# Patient Record
Sex: Male | Born: 1955 | Race: White | Hispanic: No | Smoking: Never smoker
Health system: Southern US, Community
[De-identification: ages and names within clinical notes are randomized; demographics above are authoritative.]

## PROBLEM LIST (undated history)

## (undated) DIAGNOSIS — L02219 Cutaneous abscess of trunk, unspecified: Secondary | ICD-10-CM

## (undated) DIAGNOSIS — I4891 Unspecified atrial fibrillation: Secondary | ICD-10-CM

## (undated) DIAGNOSIS — M545 Low back pain, unspecified: Secondary | ICD-10-CM

## (undated) DIAGNOSIS — L03119 Cellulitis of unspecified part of limb: Secondary | ICD-10-CM

## (undated) DIAGNOSIS — J45909 Unspecified asthma, uncomplicated: Secondary | ICD-10-CM

## (undated) DIAGNOSIS — C6290 Malignant neoplasm of unspecified testis, unspecified whether descended or undescended: Secondary | ICD-10-CM

## (undated) DIAGNOSIS — N529 Male erectile dysfunction, unspecified: Secondary | ICD-10-CM

## (undated) DIAGNOSIS — K5790 Diverticulosis of intestine, part unspecified, without perforation or abscess without bleeding: Secondary | ICD-10-CM

## (undated) DIAGNOSIS — I10 Essential (primary) hypertension: Secondary | ICD-10-CM

## (undated) DIAGNOSIS — L03319 Cellulitis of trunk, unspecified: Secondary | ICD-10-CM

## (undated) DIAGNOSIS — Z86010 Personal history of colonic polyps: Secondary | ICD-10-CM

## (undated) DIAGNOSIS — C259 Malignant neoplasm of pancreas, unspecified: Secondary | ICD-10-CM

## (undated) DIAGNOSIS — K8689 Other specified diseases of pancreas: Secondary | ICD-10-CM

## (undated) DIAGNOSIS — L02419 Cutaneous abscess of limb, unspecified: Secondary | ICD-10-CM

## (undated) HISTORY — DX: Unspecified asthma, uncomplicated: J45.909

## (undated) HISTORY — DX: Cellulitis of trunk, unspecified: L03.319

## (undated) HISTORY — DX: Essential (primary) hypertension: I10

## (undated) HISTORY — DX: Malignant neoplasm of unspecified testis, unspecified whether descended or undescended: C62.90

## (undated) HISTORY — DX: Cutaneous abscess of trunk, unspecified: L02.219

## (undated) HISTORY — DX: Male erectile dysfunction, unspecified: N52.9

## (undated) HISTORY — DX: Low back pain: M54.5

## (undated) HISTORY — DX: Cutaneous abscess of limb, unspecified: L02.419

## (undated) HISTORY — PX: CLAVICLE SURGERY: SHX598

## (undated) HISTORY — PX: KNEE ARTHROPLASTY: SHX992

## (undated) HISTORY — DX: Diverticulosis of intestine, part unspecified, without perforation or abscess without bleeding: K57.90

## (undated) HISTORY — DX: Low back pain, unspecified: M54.50

## (undated) HISTORY — DX: Malignant neoplasm of pancreas, unspecified: C25.9

## (undated) HISTORY — DX: Other specified diseases of pancreas: K86.89

## (undated) HISTORY — PX: TONSILLECTOMY: SUR1361

## (undated) HISTORY — PX: OTHER SURGICAL HISTORY: SHX169

## (undated) HISTORY — DX: Unspecified atrial fibrillation: I48.91

## (undated) HISTORY — DX: Cellulitis of unspecified part of limb: L03.119

## (undated) HISTORY — DX: Personal history of colonic polyps: Z86.010

---

## 2001-03-04 DIAGNOSIS — C6290 Malignant neoplasm of unspecified testis, unspecified whether descended or undescended: Secondary | ICD-10-CM

## 2001-03-04 HISTORY — PX: ORCHIECTOMY: SHX2116

## 2001-03-04 HISTORY — DX: Malignant neoplasm of unspecified testis, unspecified whether descended or undescended: C62.90

## 2001-07-29 ENCOUNTER — Ambulatory Visit (HOSPITAL_COMMUNITY): Admission: RE | Admit: 2001-07-29 | Discharge: 2001-07-29 | Payer: Self-pay | Admitting: Urology

## 2001-07-29 ENCOUNTER — Encounter: Payer: Self-pay | Admitting: Urology

## 2001-08-05 ENCOUNTER — Encounter (INDEPENDENT_AMBULATORY_CARE_PROVIDER_SITE_OTHER): Payer: Self-pay | Admitting: Specialist

## 2001-08-05 ENCOUNTER — Ambulatory Visit (HOSPITAL_BASED_OUTPATIENT_CLINIC_OR_DEPARTMENT_OTHER): Admission: RE | Admit: 2001-08-05 | Discharge: 2001-08-05 | Payer: Self-pay | Admitting: Urology

## 2001-08-11 ENCOUNTER — Ambulatory Visit: Admission: RE | Admit: 2001-08-11 | Discharge: 2001-11-09 | Payer: Self-pay | Admitting: *Deleted

## 2001-08-25 ENCOUNTER — Ambulatory Visit (HOSPITAL_COMMUNITY): Admission: AD | Admit: 2001-08-25 | Discharge: 2001-08-25 | Payer: Self-pay | Admitting: Urology

## 2001-08-26 ENCOUNTER — Encounter (HOSPITAL_BASED_OUTPATIENT_CLINIC_OR_DEPARTMENT_OTHER): Admission: RE | Admit: 2001-08-26 | Discharge: 2001-10-27 | Payer: Self-pay | Admitting: Urology

## 2005-10-26 ENCOUNTER — Encounter (INDEPENDENT_AMBULATORY_CARE_PROVIDER_SITE_OTHER): Payer: Self-pay | Admitting: Cardiology

## 2005-10-26 ENCOUNTER — Inpatient Hospital Stay (HOSPITAL_COMMUNITY): Admission: EM | Admit: 2005-10-26 | Discharge: 2005-11-05 | Payer: Self-pay | Admitting: *Deleted

## 2005-10-28 ENCOUNTER — Ambulatory Visit: Payer: Self-pay | Admitting: Physical Medicine & Rehabilitation

## 2005-11-05 ENCOUNTER — Inpatient Hospital Stay (HOSPITAL_COMMUNITY)
Admission: RE | Admit: 2005-11-05 | Discharge: 2005-11-08 | Payer: Self-pay | Admitting: Physical Medicine & Rehabilitation

## 2005-12-05 ENCOUNTER — Ambulatory Visit: Payer: Self-pay | Admitting: Internal Medicine

## 2006-01-15 ENCOUNTER — Ambulatory Visit: Payer: Self-pay | Admitting: Internal Medicine

## 2006-03-21 ENCOUNTER — Ambulatory Visit: Payer: Self-pay | Admitting: Internal Medicine

## 2006-04-11 ENCOUNTER — Ambulatory Visit: Payer: Self-pay | Admitting: Internal Medicine

## 2006-10-22 DIAGNOSIS — M545 Low back pain: Secondary | ICD-10-CM

## 2007-07-10 ENCOUNTER — Ambulatory Visit: Payer: Self-pay | Admitting: Internal Medicine

## 2007-07-10 DIAGNOSIS — L02419 Cutaneous abscess of limb, unspecified: Secondary | ICD-10-CM

## 2007-07-10 DIAGNOSIS — L03119 Cellulitis of unspecified part of limb: Secondary | ICD-10-CM

## 2009-02-06 ENCOUNTER — Telehealth: Payer: Self-pay | Admitting: Internal Medicine

## 2009-02-07 ENCOUNTER — Ambulatory Visit: Payer: Self-pay | Admitting: Internal Medicine

## 2009-02-07 DIAGNOSIS — L03319 Cellulitis of trunk, unspecified: Secondary | ICD-10-CM

## 2009-02-07 DIAGNOSIS — L02219 Cutaneous abscess of trunk, unspecified: Secondary | ICD-10-CM

## 2009-02-15 ENCOUNTER — Ambulatory Visit: Payer: Self-pay | Admitting: Internal Medicine

## 2009-11-15 ENCOUNTER — Encounter: Payer: Self-pay | Admitting: Internal Medicine

## 2010-04-03 NOTE — Letter (Signed)
Summary: [facility]  [facility]   Imported By: Maryln Gottron 11/21/2009 09:48:49  _____________________________________________________________________  External Attachment:    Type:   Image     Comment:   External Document

## 2010-05-09 ENCOUNTER — Encounter: Payer: Self-pay | Admitting: Family Medicine

## 2010-05-09 ENCOUNTER — Ambulatory Visit (INDEPENDENT_AMBULATORY_CARE_PROVIDER_SITE_OTHER): Payer: BC Managed Care – PPO | Admitting: Family Medicine

## 2010-05-09 VITALS — BP 140/90 | Temp 99.4°F | Ht 73.0 in | Wt 261.0 lb

## 2010-05-09 DIAGNOSIS — L02419 Cutaneous abscess of limb, unspecified: Secondary | ICD-10-CM

## 2010-05-09 MED ORDER — CEPHALEXIN 500 MG PO CAPS: 500.0000 mg | ORAL_CAPSULE | Freq: Four times a day (QID) | ORAL | Status: AC

## 2010-05-09 NOTE — Patient Instructions (Signed)
Elevate leg frequently Heating pad to leg several times daily. Follow up with Dr Lovell Sheehan if redness and swelling not promptly improving over next few days

## 2010-05-09 NOTE — Progress Notes (Signed)
  Subjective:    Patient ID: Austin Escobar, male    DOB: Aug 19, 1955, 55 y.o.   MRN: [id number]  HPI  Patient seen with swelling and tenderness work left leg mostly posterior calf region. First noticed some soreness this past Sunday which was 4 days ago. By yesterday noticed some redness and swelling. No injury. Denies any fever or chills. 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 Prior history of phlebitis.   Review of Systems  denies fever or chills. No dyspnea or Chest pain.    Objective:   Physical Exam  patient is alert and in no distress Chest clear to auscultation Heart regular rhythm and rate Extremities left leg reveals area of redness warmth tenderness and mild swelling about 10 x 16 cm. He has an erythematous streak which runs the distal medial aspect of calf. No fluctuance. No obvious breaks in scan. Dorsalis pedis pulses are intact       Assessment & Plan:   cellulitis left leg. Frequent elevation , heating pad several times daily and start Keflex 500 mg 4 times a day for 10 days. Followup with primary in the next few days if not improving
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
## 2010-07-20 NOTE — Op Note (Signed)
[facility]  Patient:    RITCHIE, KLEE Visit Number: [id number] MRN: [id number]          Service Type: NES Location: [facility] Attending Physician:  Laqueta Jean Dictated by:   Vonzell Schlatter Patsi Sears, M.D. Proc. Date: 08/05/01 Admit Date:  08/05/2001 Discharge Date: 08/05/2001                             Operative Report  PREOPERATIVE DIAGNOSIS:  Right testicular mass.  POSTOPERATIVE DIAGNOSIS:  Right testicular seminoma.  OPERATION:  Right radical orchiectomy.  SURGEON:  Sigmund I. Patsi Sears, M.D.  Threasa HeadsAllyne Gee, P.A.-S.  PREPARATION:  After appropriate preanesthesia, the patient is brought to the operating room and placed upon the operating table in the dorsal supine position where general endotracheal anesthesia was introduced.  DESCRIPTION OF PROCEDURE:  A 10 cm right inguinal incision was made; subcutaneous tissue was dissected with the electrosurgical unit.  A very large 10 cm right hemiscrotal mass was identified, and it was very hard to palpation.  Subcutaneous tissue was dissected, and the external oblique fascia was opened.  The testicle was delivered into the wound, and a drain was wrapped around the cord and clamped.  Biopsy of the testicle revealed a malignancy, probable seminoma.  The testicle was then removed by amputating the spermatic cord by doubly clamping it, emptying it, ligating with #1 chromic suture, and placing a 0 Prolene suture around the end of the spermatic cord, in case of future removal of the spermatic cord.  The wound was irrigated and closed in the following fashion.  Closure:  The wound was closed with running and interrupted 3-0 Vicryl suture. The skin was closed with interrupted 4-0 Vicryl suture.  Sterile dressings and compression dressing was applied, and the patient was awakened and taken to the recovery room in good condition.  He was given Marcaine in the cord and in the wound edges  and given IV Toradol prior to awakening. Dictated by:   Vonzell Schlatter Patsi Sears, M.D. Attending Physician:  Laqueta Jean DD:  08/05/01 TD:  08/07/01 Job: [id number]
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
## 2010-07-20 NOTE — Op Note (Signed)
[facility]  Patient:    Austin Escobar, Austin Escobar Visit Number: [id number] MRN: [id number]          Service Type: DSU Location: DAY Attending Physician:  Laqueta Jean Dictated by:   Vonzell Schlatter Patsi Sears, M.D. Proc. Date: 08/25/01 Admit Date:  08/25/2001 Discharge Date: 08/25/2001                             Operative Report  PREOPERATIVE DIAGNOSIS:  Right scrotal hematoma.  POSTOPERATIVE DIAGNOSIS:  Possibly infected right scrotal hematoma.  PROCEDURE:  SURGEON:  Sigmund I. Patsi Sears, M.D.  ANESTHESIA:  General endotracheal.  PREPARATION:  After appropriate preanesthesia, the patient was brought to the operating room and placed on the operating table in the dorsal supine position where general endotracheal anesthesia was introduced.  The scrotum and inguinal canal were prepped with Betadine solution and draped in the usual fashion.  REVIEW OF HISTORY:  The patient is a 55 year old male, status post a right inguinal orchiectomy approximately two weeks ago, on August 05, 2001, who has had pain and swelling in his right hemiscrotum since his surgery.  The patient did not go to see the radiation therapist because of pain in his hemiscrotum.  He was seen in the office this morning with diagnosis of postoperative wound hematoma, and for incision and drainage tonight.  DESCRIPTION OF PROCEDURE:  A 4 cm incision was made in the subcutaneous tissue and was dissected through the old incision.  Blunt dissection and sharp dissection was accomplished until the scrotum is entered, and creamy necrotic tissue was identified and cultured.  The wound was then opened more generously distalward into the scrotum, and the scrotum is drained of necrotic hematoma. The Pulsavac was then used to completely debride the scrotal and inguinal tissue, and it was elected to leave this wound open, and place a 4 x 18 dressing soaked in Betadine in the wound.  The patient  was covered with IV Ancef, and then awakened and taken to the recovery room after a dressing was applied.  He will be sent for outpatient wound care. Dictated by:   Vonzell Schlatter Patsi Sears, M.D. Attending Physician:  Laqueta Jean DD:  08/25/01 TD:  08/27/01 Job: [id number]
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
## 2010-10-11 ENCOUNTER — Telehealth: Payer: Self-pay | Admitting: *Deleted

## 2010-10-11 NOTE — Telephone Encounter (Signed)
Opened in error

## 2012-03-12 ENCOUNTER — Encounter: Payer: Self-pay | Admitting: Internal Medicine

## 2012-03-12 ENCOUNTER — Ambulatory Visit (INDEPENDENT_AMBULATORY_CARE_PROVIDER_SITE_OTHER): Payer: BC Managed Care – PPO | Admitting: Internal Medicine

## 2012-03-12 VITALS — BP 160/100 | HR 92 | Temp 99.0°F | Resp 18 | Wt 264.0 lb

## 2012-03-12 DIAGNOSIS — Z0000 Encounter for general adult medical examination without abnormal findings: Secondary | ICD-10-CM

## 2012-03-12 DIAGNOSIS — I499 Cardiac arrhythmia, unspecified: Secondary | ICD-10-CM

## 2012-03-12 DIAGNOSIS — I4891 Unspecified atrial fibrillation: Secondary | ICD-10-CM

## 2012-03-12 DIAGNOSIS — R109 Unspecified abdominal pain: Secondary | ICD-10-CM

## 2012-03-12 MED ORDER — DILTIAZEM HCL ER 180 MG PO CP24: 180.0000 mg | ORAL_CAPSULE | Freq: Every day | ORAL | Status: DC

## 2012-03-12 MED ORDER — ASPIRIN 81 MG PO TBEC: 81.0000 mg | DELAYED_RELEASE_TABLET | Freq: Every day | ORAL | Status: DC

## 2012-03-12 NOTE — Patient Instructions (Addendum)
  Colonoscopy as discussed  Annual exam as scheduled and call if there is any clinical worsening  Low-salt diet  Cardiology followup as discussed

## 2012-03-12 NOTE — Progress Notes (Signed)
Subjective:    Patient ID: Austin Escobar, male    DOB: 1955-05-13, 57 y.o.   MRN: [id number]  HPI  57 year old patient who presents with a chief complaint of left upper quadrant pain. This has been present for the last 5 weeks. He describes it as a sensation of the gaseousness and fullness that occurs intermittently. For the past 2 weeks symptoms have intensified. His bowel habits have become a bit more sluggish but not severe constipation issues and is alleviated by passage of flatus or having a bowel movement. No weight loss nausea back pain. No prior colonoscopy but he is agreeable. He is followed biannually by urology due to to a history of testicular cancer. He takes no chronic medications  No past medical history on file.  History   Social History  . Marital Status: Married    Spouse Name: N/A    Number of Children: N/A  . Years of Education: N/A   Occupational History  . Not on file.   Social History Main Topics  . Smoking status: Never Smoker   . Smokeless tobacco: Not on file  . Alcohol Use: Not on file  . Drug Use: Not on file  . Sexually Active: Not on file   Other Topics Concern  . Not on file   Social History Narrative  . No narrative on file    No past surgical history on file.  No family history on file.  No Known Allergies  Current Outpatient Prescriptions on File Prior to Visit  Medication Sig Dispense Refill  . tadalafil (CIALIS) 20 MG tablet Take 20 mg by mouth daily as needed.      . Cream Base (PCCA LIPODERM BASE) CREA Apply topically. 10 mg daily rubbed into hairless area on body, hips         BP 160/100  Pulse 92  Temp 99 F (37.2 C) (Oral)  Resp 18  Wt 264 lb (119.75 kg)  SpO2 95%       Review of Systems  Constitutional: Negative for fever, chills, appetite change and fatigue.  HENT: Negative for hearing loss, ear pain, congestion, sore throat, trouble swallowing, neck stiffness, dental problem, voice change and tinnitus.   Eyes:  Negative for pain, discharge and visual disturbance.  Respiratory: Negative for cough, chest tightness, wheezing and stridor.   Cardiovascular: Negative for chest pain, palpitations and leg swelling.  Gastrointestinal: Positive for abdominal pain. Negative for nausea, vomiting, diarrhea, constipation, blood in stool and abdominal distention.  Genitourinary: Negative for urgency, hematuria, flank pain, discharge, difficulty urinating and genital sores.  Musculoskeletal: Negative for myalgias, back pain, joint swelling, arthralgias and gait problem.  Skin: Negative for rash.  Neurological: Negative for dizziness, syncope, speech difficulty, weakness, numbness and headaches.  Hematological: Negative for adenopathy. Does not bruise/bleed easily.  Psychiatric/Behavioral: Negative for behavioral problems and dysphoric mood. The patient is not nervous/anxious.        Objective:   Physical Exam  Constitutional: He is oriented to person, place, and time. He appears well-developed.  HENT:  Head: Normocephalic.  Right Ear: External ear normal.  Left Ear: External ear normal.  Eyes: Conjunctivae normal and EOM are normal.  Neck: Normal range of motion.  Cardiovascular: Normal rate and normal heart sounds.        Pulse rate is irregular and 90-100  Pulmonary/Chest: Breath sounds normal.  Abdominal: Soft. Bowel sounds are normal. He exhibits no distension and no mass. There is no tenderness. There is no rebound and  no guarding.  Musculoskeletal: Normal range of motion. He exhibits no edema and no tenderness.  Neurological: He is alert and oriented to person, place, and time.  Psychiatric: He has a normal mood and affect. His behavior is normal.          Assessment & Plan:   Nonspecific abdominal pain with mild change in bowel habits. We'll set up for a colonoscopy Atrial fibrillation. Will place on diltiazem for rate control and place on daily aspirin ([place] 0).  We'll check 2-D echocardiogram  and set up for cardiology evaluation. Will consider anticoagulation at that time Exogenous obesity. Weight loss encouraged  Schedule CPX with PCP

## 2012-03-13 ENCOUNTER — Encounter: Payer: Self-pay | Admitting: Internal Medicine

## 2012-03-18 ENCOUNTER — Ambulatory Visit (HOSPITAL_COMMUNITY): Payer: BC Managed Care – PPO | Attending: Cardiovascular Disease | Admitting: Radiology

## 2012-03-18 DIAGNOSIS — I10 Essential (primary) hypertension: Secondary | ICD-10-CM | POA: Insufficient documentation

## 2012-03-18 DIAGNOSIS — I369 Nonrheumatic tricuspid valve disorder, unspecified: Secondary | ICD-10-CM | POA: Insufficient documentation

## 2012-03-18 DIAGNOSIS — I4891 Unspecified atrial fibrillation: Secondary | ICD-10-CM

## 2012-03-18 DIAGNOSIS — I379 Nonrheumatic pulmonary valve disorder, unspecified: Secondary | ICD-10-CM | POA: Insufficient documentation

## 2012-03-18 NOTE — Progress Notes (Signed)
Echocardiogram performed.  

## 2012-03-24 ENCOUNTER — Encounter: Payer: Self-pay | Admitting: *Deleted

## 2012-03-24 ENCOUNTER — Ambulatory Visit (INDEPENDENT_AMBULATORY_CARE_PROVIDER_SITE_OTHER): Payer: BC Managed Care – PPO | Admitting: Cardiovascular Disease

## 2012-03-24 VITALS — BP 126/80 | HR 90 | Wt 258.0 lb

## 2012-03-24 DIAGNOSIS — I10 Essential (primary) hypertension: Secondary | ICD-10-CM | POA: Insufficient documentation

## 2012-03-24 DIAGNOSIS — I4891 Unspecified atrial fibrillation: Secondary | ICD-10-CM | POA: Insufficient documentation

## 2012-03-24 DIAGNOSIS — D649 Anemia, unspecified: Secondary | ICD-10-CM

## 2012-03-24 NOTE — Assessment & Plan Note (Signed)
Continue ASA and cardizem.  Rate control fine. CHADVASC2 score only one so no anticoagulation indicated unless he wants attempt at [facility].   F/U in February post colonoscopy.  Etiology related to ETOH and HTN.  Need to abstain discussed

## 2012-03-24 NOTE — Assessment & Plan Note (Signed)
Well controlled.  Continue current medications and low sodium Dash type diet.    

## 2012-03-24 NOTE — Assessment & Plan Note (Signed)
To see GI and arrange colonoscopy No anticoagulation until this is done

## 2012-03-24 NOTE — Progress Notes (Signed)
Patient ID: Austin Escobar, male   DOB: 21-Jan-1956, 57 y.o.   MRN: [id number]  57 yo referred by Dr Kirtland Bouchard for afib.  Patient is asymptomatic.  Likely has HTN not Rx until a week ago.  CHADVASC2 score only 1.  Admits to daily excessive beer consumption.  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  Echo with no valve disease normal EF and moderate LAE.  He needs a colonoscopy in the near future.  Discussed options of ASA and rate control since he is asymptomatic.  No reason to [facility] if he cannot stop ETOH as he will revert.  Also no reason to start anticoagulant if it needs to be stopped for colonoscopy.  Will see again end of February and he will let me know how ETOH is going and decide if he wants and attempt at [facility].  No chest pain, dyspnea or palpitations. Abdominal pain is improved since seeing Dr Kirtland Bouchard  Study Conclusions  03/1512    - Left ventricle: The cavity size was normal. Wall thickness was increased in a pattern of moderate LVH. Systolic function was vigorous. The estimated ejection fraction was in the range of 65% to 70%. - Left atrium: The atrium was moderately dilated.   ROS: Denies fever, malais, weight loss, blurry vision, decreased visual acuity, cough, sputum, SOB, hemoptysis, pleuritic pain, palpitaitons, heartburn, abdominal pain, melena, lower extremity edema, claudication, or rash.  All other systems reviewed and negative   General: Affect appropriate Healthy:  appears stated age HEENT: normal Neck supple with no adenopathy JVP normal no bruits no thyromegaly Lungs clear with no wheezing and good diaphragmatic motion Heart:  S1/S2 no murmur,rub, gallop or click PMI normal Abdomen: benighn, BS positve, no tenderness, no AAA no bruit.  No HSM or HJR Distal pulses intact with no bruits No edema Neuro non-focal Skin warm and dry No muscular weakness  Medications Current Outpatient Prescriptions  Medication Sig Dispense Refill  . aspirin 81 MG EC tablet  Take 1 tablet (81 mg total) by mouth daily. Swallow whole.  30 tablet  12  . Cream Base (PCCA LIPODERM BASE) CREA Apply topically. 10 mg daily rubbed into hairless area on body, hips       . diltiazem (DILACOR XR) 180 MG 24 hr capsule Take 1 capsule (180 mg total) by mouth daily.  90 capsule  4  . MULTIPLE VITAMIN PO Take 1 tablet by mouth daily.      . tadalafil (CIALIS) 20 MG tablet Take 20 mg by mouth daily as needed.        Allergies Review of patient's allergies indicates no known allergies.  Family History: No family history on file.  Social History: History   Social History  . Marital Status: Married    Spouse Name: N/A    Number of Children: N/A  . Years of Education: N/A   Occupational History  . Not on file.   Social History Main Topics  . Smoking status: Never Smoker   . Smokeless tobacco: Not on file  . Alcohol Use: Not on file  . Drug Use: Not on file  . Sexually Active: Not on file   Other Topics Concern  . Not on file   Social History Narrative  . No narrative on file    Electrocardiogram:  03/12/12  AFib rate 88 LVH LAD   Assessment and Plan

## 2012-03-24 NOTE — Patient Instructions (Signed)
Your physician recommends that you schedule a follow-up appointment in: END OF FEB WITH DR [facility]  Your physician recommends that you continue on your current medications as directed. Please refer to the Current Medication list given to you today.

## 2012-03-25 ENCOUNTER — Ambulatory Visit: Payer: BC Managed Care – PPO | Admitting: Internal Medicine

## 2012-03-26 ENCOUNTER — Encounter: Payer: Self-pay | Admitting: Internal Medicine

## 2012-03-26 ENCOUNTER — Ambulatory Visit (INDEPENDENT_AMBULATORY_CARE_PROVIDER_SITE_OTHER): Payer: BC Managed Care – PPO | Admitting: Internal Medicine

## 2012-03-26 VITALS — BP 120/74 | HR 92 | Temp 98.7°F | Ht 74.5 in | Wt 256.0 lb

## 2012-03-26 DIAGNOSIS — I4891 Unspecified atrial fibrillation: Secondary | ICD-10-CM

## 2012-03-26 DIAGNOSIS — I10 Essential (primary) hypertension: Secondary | ICD-10-CM

## 2012-03-26 DIAGNOSIS — R109 Unspecified abdominal pain: Secondary | ICD-10-CM | POA: Insufficient documentation

## 2012-03-26 LAB — TSH: TSH: 2.43 u[IU]/mL (ref 0.35–5.50)

## 2012-03-26 LAB — CBC WITH DIFFERENTIAL/PLATELET
Basophils Absolute: 0 10*3/uL (ref 0.0–0.1)
Eosinophils Relative: 0.8 % (ref 0.0–5.0)
HCT: 57 % — ABNORMAL HIGH (ref 39.0–52.0)
Lymphs Abs: 1.4 10*3/uL (ref 0.7–4.0)
Monocytes Absolute: 0.8 10*3/uL (ref 0.1–1.0)
Monocytes Relative: 9.7 % (ref 3.0–12.0)
Neutrophils Relative %: 73.2 % (ref 43.0–77.0)
Platelets: 222 10*3/uL (ref 150.0–400.0)
RDW: 13.2 % (ref 11.5–14.6)
WBC: 8.5 10*3/uL (ref 4.5–10.5)

## 2012-03-26 LAB — BASIC METABOLIC PANEL
BUN: 16 mg/dL (ref 6–23)
Creatinine, Ser: 1 mg/dL (ref 0.4–1.5)
GFR: 80.36 mL/min (ref >60.00)
Glucose, Bld: 99 mg/dL (ref 70–99)
Potassium: 4.2 mEq/L (ref 3.5–5.1)

## 2012-03-26 LAB — T4, FREE: Free T4: 0.95 ng/dL (ref 0.60–1.60)

## 2012-03-26 LAB — HEPATIC FUNCTION PANEL
AST: 26 U/L (ref 0–37)
Bilirubin, Direct: 0.1 mg/dL (ref 0.0–0.3)
Total Bilirubin: 0.9 mg/dL (ref 0.3–1.2)

## 2012-03-26 MED ORDER — DILTIAZEM HCL ER 240 MG PO CP24: 240.0000 mg | ORAL_CAPSULE | Freq: Every day | ORAL | Status: DC

## 2012-03-26 MED ORDER — HYOSCYAMINE SULFATE 0.125 MG PO TABS
0.1250 mg | ORAL_TABLET | Freq: Four times a day (QID) | ORAL | Status: DC | PRN
Start: 2012-03-26 — End: 2012-05-19

## 2012-03-26 NOTE — Progress Notes (Signed)
Subjective:    Patient ID: Austin Escobar, male    DOB: Aug 13, 1955, 57 y.o.   MRN: [id number]  HPI  57 year old white male with history of a-fib and alcohol use for followup regarding abdominal pain. He was seen 2 weeks ago by Dr. Amador Cunas. He complains of intermittent left upper quadrant pain. He describes sensation as "gas type pain". It is usually relieved by bowel movement and passing gas. However patient concerned that his symptoms getting worse. He notes symptoms initially started around Thanksgiving.  Patient rates severity as 3/10. He denies fever or chills but has noticed change in bowel habits. He is awaiting referral to gastroenterologist for colonoscopy.  Review of Systems He has decreased his alcohol use substantially,  Denies vomiting  Past Medical History  Diagnosis Date  . ANEMIA-NOS   . Cellulitis and abscess of trunk   . CELLULITIS, RIGHT LEG   . LOW BACK PAIN   . Afib   . Abdominal  pain, other specified site     History   Social History  . Marital Status: Married    Spouse Name: N/A    Number of Children: N/A  . Years of Education: N/A   Occupational History  . Not on file.   Social History Main Topics  . Smoking status: Never Smoker   . Smokeless tobacco: Not on file  . Alcohol Use: Not on file  . Drug Use: Not on file  . Sexually Active: Not on file   Other Topics Concern  . Not on file   Social History Narrative  . No narrative on file    No past surgical history on file.  No family history on file.  No Known Allergies  Current Outpatient Prescriptions on File Prior to Visit  Medication Sig Dispense Refill  . aspirin 81 MG EC tablet Take 1 tablet (81 mg total) by mouth daily. Swallow whole.  30 tablet  12  . ASTAXANTHIN PO Take 2 tablets by mouth daily.      . Cream Base (PCCA LIPODERM BASE) CREA Apply topically. 10 mg daily rubbed into hairless area on body, hips       . diltiazem (DILACOR XR) 240 MG 24 hr capsule Take 1 capsule  (240 mg total) by mouth daily.  30 capsule  2  . Hyaluronic Acid-Vitamin C (HYALURONIC ACID PO) Take 2 tablets by mouth daily.      . MULTIPLE VITAMIN PO Take 1 tablet by mouth daily.      . tadalafil (CIALIS) 20 MG tablet Take 20 mg by mouth daily as needed.      . hyoscyamine (LEVSIN, ANASPAZ) 0.125 MG tablet Take 1 tablet (0.125 mg total) by mouth every 6 (six) hours as needed for cramping.  30 tablet  1    BP 120/74  Pulse 92  Temp 98.7 F (37.1 C) (Oral)  Ht 6' 2.5" (1.892 m)  Wt 256 lb (116.121 kg)  BMI 32.43 kg/m2       Objective:   Physical Exam  Constitutional: He is oriented to person, place, and time. He appears well-developed and well-nourished. No distress.  HENT:  Head: Normocephalic.  Right Ear: External ear normal.  Left Ear: External ear normal.  Mouth/Throat: Oropharynx is clear and moist.  Eyes: EOM are normal. Pupils are equal, round, and reactive to light.  Neck: Neck supple.       No carotid bruit  Cardiovascular: Normal rate and normal heart sounds.   No murmur heard.  Irregular rhythm  Pulmonary/Chest: Effort normal and breath sounds normal. He has no wheezes.  Abdominal: Soft. Bowel sounds are normal. He exhibits no mass. There is no rebound.       Epigastric and left upper quadrant tenderness  Genitourinary: Rectum normal and prostate normal. Guaiac negative stool.       No rectal mass  Musculoskeletal: He exhibits no edema.  Neurological: He is alert and oriented to person, place, and time. No cranial nerve deficit.  Skin:       Ruddy complexion  Psychiatric: He has a normal mood and affect. His behavior is normal.          Assessment & Plan:

## 2012-03-26 NOTE — Assessment & Plan Note (Addendum)
57 year old has progressive left upper quadrant abdominal pain. Symptoms are intermittent. He describes sensation as possible gas pains. Patient reports change in his bowel habits. This is somewhat concerning for abdominal malignancy. He has appointment with gastroenterology for colonoscopy. I suggest we obtain CT of the abdomen and pelvis to rule out abdominal mass.  Check CBCD, LFTs, BMET.  Use anti spasmodic for now.

## 2012-03-26 NOTE — Assessment & Plan Note (Addendum)
Stable.  BP: 120/74 mmHg  Lab Results  Component Value Date   CREATININE 1.0 03/26/2012

## 2012-03-26 NOTE — Assessment & Plan Note (Signed)
Increase Cardizem for better rate control.

## 2012-03-30 ENCOUNTER — Telehealth: Payer: Self-pay | Admitting: Oncology

## 2012-03-30 ENCOUNTER — Other Ambulatory Visit: Payer: Self-pay | Admitting: Internal Medicine

## 2012-03-30 DIAGNOSIS — D45 Polycythemia vera: Secondary | ICD-10-CM

## 2012-03-30 NOTE — Telephone Encounter (Signed)
C/D 03/30/12 for appt.04/15/12

## 2012-03-30 NOTE — Telephone Encounter (Signed)
S/W pt in re NP appt 02/12 @ 10:30 w/Dr. Clelia Croft.  Referring Dr. [name] Dx- Polycythemia Welcome packet mailed.

## 2012-04-01 ENCOUNTER — Other Ambulatory Visit: Payer: BC Managed Care – PPO

## 2012-04-06 ENCOUNTER — Encounter: Payer: Self-pay | Admitting: Internal Medicine

## 2012-04-06 ENCOUNTER — Ambulatory Visit (INDEPENDENT_AMBULATORY_CARE_PROVIDER_SITE_OTHER): Payer: BC Managed Care – PPO | Admitting: Internal Medicine

## 2012-04-06 VITALS — BP 140/80 | HR 66 | Ht 73.0 in | Wt 253.0 lb

## 2012-04-06 DIAGNOSIS — D751 Secondary polycythemia: Secondary | ICD-10-CM

## 2012-04-06 DIAGNOSIS — R1012 Left upper quadrant pain: Secondary | ICD-10-CM

## 2012-04-06 DIAGNOSIS — R198 Other specified symptoms and signs involving the digestive system and abdomen: Secondary | ICD-10-CM

## 2012-04-06 DIAGNOSIS — R194 Change in bowel habit: Secondary | ICD-10-CM

## 2012-04-06 MED ORDER — NA SULFATE-K SULFATE-MG SULF 17.5-3.13-1.6 GM/177ML PO SOLN: ORAL | Status: DC

## 2012-04-06 NOTE — Patient Instructions (Addendum)
You have been scheduled for a colonoscopy with propofol. Please follow written instructions given to you at your visit today.  Please use the suprep kit you have been given today. If you use inhalers (even only as needed) or a CPAP machine, please bring them with you on the day of your procedure.  Your CT has been cancelled.   Thank you for choosing me and [facility].  Iva Boop, M.D., [facility]
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
## 2012-04-08 ENCOUNTER — Telehealth: Payer: Self-pay | Admitting: Internal Medicine

## 2012-04-08 NOTE — Telephone Encounter (Signed)
No

## 2012-04-09 ENCOUNTER — Encounter: Payer: BC Managed Care – PPO | Admitting: Internal Medicine

## 2012-04-09 ENCOUNTER — Other Ambulatory Visit: Payer: BC Managed Care – PPO

## 2012-04-10 ENCOUNTER — Other Ambulatory Visit: Payer: Self-pay | Admitting: Oncology

## 2012-04-10 DIAGNOSIS — D751 Secondary polycythemia: Secondary | ICD-10-CM

## 2012-04-15 ENCOUNTER — Ambulatory Visit: Payer: BC Managed Care – PPO

## 2012-04-15 ENCOUNTER — Ambulatory Visit: Payer: BC Managed Care – PPO | Admitting: Oncology

## 2012-04-15 ENCOUNTER — Other Ambulatory Visit: Payer: BC Managed Care – PPO | Admitting: Lab

## 2012-04-22 ENCOUNTER — Encounter: Payer: Self-pay | Admitting: Internal Medicine

## 2012-04-22 ENCOUNTER — Ambulatory Visit (AMBULATORY_SURGERY_CENTER): Payer: BC Managed Care – PPO | Admitting: Internal Medicine

## 2012-04-22 VITALS — BP 135/97 | HR 95 | Temp 98.0°F | Resp 19 | Ht 73.0 in | Wt 253.0 lb

## 2012-04-22 DIAGNOSIS — D126 Benign neoplasm of colon, unspecified: Secondary | ICD-10-CM

## 2012-04-22 MED ORDER — SODIUM CHLORIDE 0.9 % IV SOLN: 500.0000 mL | INTRAVENOUS | Status: DC

## 2012-04-22 NOTE — Patient Instructions (Addendum)
Four polyps were removed today. They all look benign. You also have diverticulosis and internal hemorrhoids which are not usually a problem.  I will let you know pathology results and when to have another routine colonoscopy by mail.  Thank you for choosing me and [facility].  Iva Boop, MD, FACG YOU HAD AN ENDOSCOPIC PROCEDURE TODAY AT THE [facility]: Refer to the procedure report that was given to you for any specific questions about what was found during the examination.  If the procedure report does not answer your questions, please call your gastroenterologist to clarify.  If you requested that your care partner not be given the details of your procedure findings, then the procedure report has been included in a sealed envelope for you to review at your convenience later.  YOU SHOULD EXPECT: Some feelings of bloating in the abdomen. Passage of more gas than usual.  Walking can help get rid of the air that was put into your GI tract during the procedure and reduce the bloating. If you had a lower endoscopy (such as a colonoscopy or flexible sigmoidoscopy) you may notice spotting of blood in your stool or on the toilet paper. If you underwent a bowel prep for your procedure, then you may not have a normal bowel movement for a few days.  DIET: Your first meal following the procedure should be a light meal and then it is ok to progress to your normal diet.  A half-sandwich or bowl of soup is an example of a good first meal.  Heavy or fried foods are harder to digest and may make you feel nauseous or bloated.  Likewise meals heavy in dairy and vegetables can cause extra gas to form and this can also increase the bloating.  Drink plenty of fluids but you should avoid alcoholic beverages for 24 hours.  ACTIVITY: Your care partner should take you home directly after the procedure.  You should plan to take it easy, moving slowly for the rest of the day.  You can resume  normal activity the day after the procedure however you should NOT DRIVE or use heavy machinery for 24 hours (because of the sedation medicines used during the test).    SYMPTOMS TO REPORT IMMEDIATELY: A gastroenterologist can be reached at any hour.  During normal business hours, 8:30 AM to 5:00 PM Monday through Friday, call [phone].  After hours and on weekends, please call the GI answering service at [phone] who will take a message and have the physician on call contact you.   Following lower endoscopy (colonoscopy or flexible sigmoidoscopy):  Excessive amounts of blood in the stool  Significant tenderness or worsening of abdominal pains  Swelling of the abdomen that is new, acute  Fever of 100F or higher   FOLLOW UP: If any biopsies were taken you will be contacted by phone or by letter within the next 1-3 weeks.  Call your gastroenterologist if you have not heard about the biopsies in 3 weeks.  Our staff will call the home number listed on your records the next business day following your procedure to check on you and address any questions or concerns that you may have at that time regarding the information given to you following your procedure. This is a courtesy call and so if there is no answer at the home number and we have not heard from you through the emergency physician on call, we will assume that you have returned to your  regular daily activities without incident.  SIGNATURES/CONFIDENTIALITY: You and/or your care partner have signed paperwork which will be entered into your electronic medical record.  These signatures attest to the fact that that the information above on your After Visit Summary has been reviewed and is understood.  Full responsibility of the confidentiality of this discharge information lies with you and/or your care-partner.  Diverticulosis, polyp and hemorrhoid information given.

## 2012-04-22 NOTE — Progress Notes (Signed)
Patient did not experience any of the following events: a burn prior to discharge; a fall within the facility; wrong site/side/patient/procedure/implant event; or a hospital transfer or hospital admission upon discharge from the facility. (G8907) Patient did not have preoperative order for IV antibiotic SSI prophylaxis. (G8918)  

## 2012-04-22 NOTE — Op Note (Signed)
[facility] [address] [place], [address]   COLONOSCOPY PROCEDURE REPORT  PATIENT: Austin Escobar, Austin Escobar  MR#: [id number] BIRTHDATE: 1955-07-27 , 57  yrs. old GENDER: Male ENDOSCOPIST: Iva Boop, MD, [facility] REFERRED OZ:[id number] [name], DO PROCEDURE DATE:  04/22/2012 PROCEDURE:   Colonoscopy with snare polypectomy ASA CLASS:   Class II INDICATIONS:Change in bowel habits. MEDICATIONS: propofol (Diprivan) 300mg  IV, MAC sedation, administered by CRNA, and These medications were titrated to patient response per physician's verbal order  DESCRIPTION OF PROCEDURE:   After the risks benefits and alternatives of the procedure were thoroughly explained, informed consent was obtained.  A digital rectal exam revealed no abnormalities of the rectum.   The LB [id number]  endoscope was introduced through the anus and advanced to the cecum, which was identified by both the appendix and ileocecal valve. No adverse events experienced.   The quality of the prep was Suprep adequate The instrument was then slowly withdrawn as the colon was fully examined.      COLON FINDINGS: Four polypoid shaped sessile polyps measuring 6-10 mm in size were found in the ascending colon, at the splenic flexure, in the descending colon, and sigmoid colon.  A polypectomy was performed with a cold snare and using snare cautery.  The resection was complete and the polyp tissue was completely retrieved.   Severe diverticulosis was noted in the sigmoid colon. Moderate sized internal hemorrhoids were found.   The colon mucosa was otherwise normal.  Retroflexed views revealed internal hemorrhoids. The time to cecum=2 minutes 47 seconds.  Withdrawal time=13 minutes 27 seconds.  The scope was withdrawn and the procedure completed. COMPLICATIONS: There were no complications.  ENDOSCOPIC IMPRESSION: 1.   Four sessile polyps measuring 6-10 mm in size were found in the ascending colon, at the  splenic flexure, in the descending colon, and sigmoid colon; polypectomy was performed with a cold snare and using snare cautery 2.   Severe diverticulosis was noted in the sigmoid colon - likely cause of transient bowel habit change 3.   Moderate sized internal hemorrhoids 4.   The colon mucosa was otherwise normal  RECOMMENDATIONS: 1.  Hold aspirin, aspirin products, and anti-inflammatory medication for 2 weeks. 2.  Timing of repeat colonoscopy will be determined by pathology findings. eSigned:  Iva Boop, MD, [facility] 04/22/2012 12:51 PM   cc: [name], DO, The Patient, and Jethro Bolus, MD

## 2012-04-22 NOTE — Progress Notes (Signed)
Called to room to assist during endoscopic procedure.  Patient ID and intended procedure confirmed with present staff. Received instructions for my participation in the procedure from the performing physician.  

## 2012-04-23 ENCOUNTER — Telehealth: Payer: Self-pay | Admitting: *Deleted

## 2012-04-23 NOTE — Telephone Encounter (Signed)
  Follow up Call-  Call back number 04/22/2012  Post procedure Call Back phone  # [phone] cell  Permission to leave phone message Yes     Patient questions:  Do you have a fever, pain , or abdominal swelling? no Pain Score  0 *  Have you tolerated food without any problems? yes  Have you been able to return to your normal activities? yes  Do you have any questions about your discharge instructions: Diet   no Medications  no Follow up visit  no  Do you have questions or concerns about your Care? no  Actions: * If pain score is 4 or above: No action needed, pain <4.

## 2012-04-29 ENCOUNTER — Encounter: Payer: Self-pay | Admitting: Internal Medicine

## 2012-04-29 ENCOUNTER — Encounter: Payer: Self-pay | Admitting: Cardiovascular Disease

## 2012-04-29 DIAGNOSIS — N529 Male erectile dysfunction, unspecified: Secondary | ICD-10-CM

## 2012-04-29 DIAGNOSIS — L03319 Cellulitis of trunk, unspecified: Secondary | ICD-10-CM

## 2012-04-29 DIAGNOSIS — Z860100 Personal history of colon polyps, unspecified: Secondary | ICD-10-CM

## 2012-04-29 DIAGNOSIS — L02219 Cutaneous abscess of trunk, unspecified: Secondary | ICD-10-CM

## 2012-04-29 DIAGNOSIS — J45909 Unspecified asthma, uncomplicated: Secondary | ICD-10-CM

## 2012-04-29 DIAGNOSIS — M545 Low back pain: Secondary | ICD-10-CM

## 2012-04-29 HISTORY — DX: Personal history of colon polyps, unspecified: Z86.0100

## 2012-04-29 HISTORY — DX: Personal history of colonic polyps: Z86.010

## 2012-04-29 NOTE — Progress Notes (Signed)
Quick Note:  4 adenomas max 10 mm Repeat colonoscopy about 04/2015 ______

## 2012-05-01 ENCOUNTER — Encounter: Payer: Self-pay | Admitting: Cardiovascular Disease

## 2012-05-01 ENCOUNTER — Encounter: Payer: Self-pay | Admitting: *Deleted

## 2012-05-01 ENCOUNTER — Ambulatory Visit (INDEPENDENT_AMBULATORY_CARE_PROVIDER_SITE_OTHER): Payer: BC Managed Care – PPO | Admitting: Cardiovascular Disease

## 2012-05-01 VITALS — BP 128/76 | HR 85 | Ht 73.0 in | Wt 247.0 lb

## 2012-05-01 DIAGNOSIS — I4891 Unspecified atrial fibrillation: Secondary | ICD-10-CM

## 2012-05-01 DIAGNOSIS — I10 Essential (primary) hypertension: Secondary | ICD-10-CM

## 2012-05-01 LAB — CBC WITH DIFFERENTIAL/PLATELET
Basophils Relative: 0.4 % (ref 0.0–3.0)
Eosinophils Relative: 0.7 % (ref 0.0–5.0)
HCT: 49.1 % (ref 39.0–52.0)
Hemoglobin: 16.8 g/dL (ref 13.0–17.0)
Lymphs Abs: 1.4 10*3/uL (ref 0.7–4.0)
MCV: 99.7 fl (ref 78.0–100.0)
Monocytes Absolute: 0.7 10*3/uL (ref 0.1–1.0)
Monocytes Relative: 10.4 % (ref 3.0–12.0)
Neutro Abs: 4.8 10*3/uL (ref 1.4–7.7)
Platelets: 220 10*3/uL (ref 150.0–400.0)
WBC: 7 10*3/uL (ref 4.5–10.5)

## 2012-05-01 LAB — BASIC METABOLIC PANEL
Chloride: 104 mEq/L (ref 96–112)
Sodium: 137 mEq/L (ref 135–145)

## 2012-05-01 MED ORDER — RIVAROXABAN 20 MG PO TABS: 20.0000 mg | ORAL_TABLET | Freq: Every day | ORAL | Status: DC

## 2012-05-01 NOTE — Progress Notes (Signed)
Patient ID: Austin Escobar, male   DOB: May 19, 1955, 57 y.o.   MRN: [id number] 57 yo referred by Dr Kirtland Bouchard for afib. Patient is asymptomatic. Likely has HTN not Rx until a week ago. CHADVASC2 score only 1. Admits to daily excessive beer consumption. 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 Echo with no valve disease normal EF and moderate LAE. He needs a colonoscopy in the near future. Discussed options of ASA and rate control since he is asymptomatic. No reason to [facility] if he cannot stop ETOH as he will revert. Also no reason to start anticoagulant if it needs to be stopped for colonoscopy. Will see again end of February and he will let me know how ETOH is going and decide if he wants and attempt at [facility]. No chest pain, dyspnea or palpitations. Abdominal pain is improved since seeing Dr Kirtland Bouchard  Study Conclusions 03/1512   - Left ventricle: The cavity size was normal. Wall thickness was increased in a pattern of moderate LVH. Systolic function was vigorous. The estimated ejection fraction was in the range of 65% to 70%. - Left atrium: The atrium was moderately dilated.  Has cut back on drinking  Willing to do [facility]  Discussed starting xarelto No contraindication to [facility]   ROS: Denies fever, malais, weight loss, blurry vision, decreased visual acuity, cough, sputum, SOB, hemoptysis, pleuritic pain, palpitaitons, heartburn, abdominal pain, melena, lower extremity edema, claudication, or rash.  All other systems reviewed and negative  General: Affect appropriate Healthy:  appears stated age HEENT: normal Neck supple with no adenopathy JVP normal no bruits no thyromegaly Lungs clear with no wheezing and good diaphragmatic motion Heart:  S1/S2 no murmur, no rub, gallop or click PMI normal Abdomen: benighn, BS positve, no tenderness, no AAA no bruit.  No HSM or HJR Distal pulses intact with no bruits No edema Neuro non-focal Skin warm and dry No muscular weakness   Current  Outpatient Prescriptions  Medication Sig Dispense Refill  . aspirin 81 MG EC tablet Take 1 tablet (81 mg total) by mouth daily. Swallow whole.  30 tablet  12  . ASTAXANTHIN PO Take 2 tablets by mouth daily.      . Cream Base (PCCA LIPODERM BASE) CREA Apply topically. 10 mg daily rubbed into hairless area on body, hips       . diltiazem (DILACOR XR) 240 MG 24 hr capsule Take 1 capsule (240 mg total) by mouth daily.  30 capsule  2  . fish oil-omega-3 fatty acids 1000 MG capsule Take 2 g by mouth daily.      Marland Kitchen Hyaluronic Acid-Vitamin C (HYALURONIC ACID PO) Take 2 tablets by mouth daily.      . hyoscyamine (LEVSIN, ANASPAZ) 0.125 MG tablet Take 1 tablet (0.125 mg total) by mouth every 6 (six) hours as needed for cramping.  30 tablet  1  . MULTIPLE VITAMIN PO Take 1 tablet by mouth daily.      . tadalafil (CIALIS) 20 MG tablet Take 20 mg by mouth daily as needed.       No current facility-administered medications for this visit.    Allergies  Review of patient's allergies indicates no known allergies.  Electrocardiogram:  Afib rate 89  1/14  Assessment and Plan

## 2012-05-01 NOTE — Patient Instructions (Addendum)
Start taking Xarelto 20mg  daily  You will need lab work today: CBC & BMP  We will schedule you for a cardioversion in 3-4 weeks. With a follow-up appt. With Dr. Eden Emms 1-2 weeks after.

## 2012-05-01 NOTE — Assessment & Plan Note (Signed)
Well controlled.  Continue current medications and low sodium Dash type diet.    

## 2012-05-01 NOTE — Assessment & Plan Note (Signed)
Rate control good with Start xarelto CBC and BMET  DCC in 3 weeks

## 2012-05-13 ENCOUNTER — Telehealth: Payer: Self-pay | Admitting: Cardiovascular Disease

## 2012-05-13 NOTE — Telephone Encounter (Signed)
Samples placed at front desk.  

## 2012-05-13 NOTE — Telephone Encounter (Signed)
New Problem: ° ° ° °Patient called in wanting to know if there were any samples of Rivaroxaban (XARELTO) 20 MG TABS available for him to have.  Please call back. °

## 2012-05-19 ENCOUNTER — Telehealth: Payer: Self-pay | Admitting: Internal Medicine

## 2012-05-19 MED ORDER — HYOSCYAMINE SULFATE 0.125 MG PO TABS: 0.1250 mg | ORAL_TABLET | Freq: Four times a day (QID) | ORAL | Status: DC | PRN

## 2012-05-19 NOTE — Telephone Encounter (Signed)
Rx refill sent.

## 2012-05-19 NOTE — Telephone Encounter (Signed)
Patient called stating that he need a refill of his hyoscyamine (LEVSIN, ANASPAZ) 0.125 MG tablet Take 1 tablet (0.125 mg total) by mouth every 6 (six) hours as needed for cramping sent to [facility]. Please assist.

## 2012-05-22 ENCOUNTER — Telehealth: Payer: Self-pay | Admitting: Internal Medicine

## 2012-05-22 MED ORDER — HYOSCYAMINE SULFATE 0.125 MG PO TABS: 0.1250 mg | ORAL_TABLET | Freq: Four times a day (QID) | ORAL | Status: DC | PRN

## 2012-05-22 NOTE — Telephone Encounter (Signed)
rx sent in electronically 

## 2012-05-22 NOTE — Telephone Encounter (Signed)
Pt is requesting hyoscyamine #120 for 30 day supply sent to [facility]. Pt take one pill every 6 hrs

## 2012-05-25 ENCOUNTER — Ambulatory Visit (HOSPITAL_COMMUNITY): Payer: BC Managed Care – PPO | Admitting: Anesthesiology

## 2012-05-25 ENCOUNTER — Ambulatory Visit (HOSPITAL_COMMUNITY)
Admission: RE | Admit: 2012-05-25 | Discharge: 2012-05-25 | Disposition: A | Discharge status: Home / Self Care | Payer: BC Managed Care – PPO | Source: Ambulatory Visit | Attending: Cardiovascular Disease | Admitting: Cardiovascular Disease

## 2012-05-25 ENCOUNTER — Encounter (HOSPITAL_COMMUNITY): Payer: Self-pay | Admitting: Anesthesiology

## 2012-05-25 ENCOUNTER — Encounter (HOSPITAL_COMMUNITY)
Admission: RE | Disposition: A | Discharge status: Home / Self Care | Payer: Self-pay | Source: Ambulatory Visit | Attending: Cardiovascular Disease

## 2012-05-25 DIAGNOSIS — F1010 Alcohol abuse, uncomplicated: Secondary | ICD-10-CM | POA: Insufficient documentation

## 2012-05-25 DIAGNOSIS — Z7982 Long term (current) use of aspirin: Secondary | ICD-10-CM | POA: Insufficient documentation

## 2012-05-25 DIAGNOSIS — I4891 Unspecified atrial fibrillation: Secondary | ICD-10-CM | POA: Insufficient documentation

## 2012-05-25 DIAGNOSIS — Z79899 Other long term (current) drug therapy: Secondary | ICD-10-CM | POA: Insufficient documentation

## 2012-05-25 DIAGNOSIS — I10 Essential (primary) hypertension: Secondary | ICD-10-CM | POA: Insufficient documentation

## 2012-05-25 DIAGNOSIS — Z7901 Long term (current) use of anticoagulants: Secondary | ICD-10-CM | POA: Insufficient documentation

## 2012-05-25 HISTORY — PX: CARDIOVERSION: SHX1299

## 2012-05-25 SURGERY — CARDIOVERSION
Anesthesia: Monitor Anesthesia Care

## 2012-05-25 MED ORDER — LIDOCAINE HCL (CARDIAC) 20 MG/ML IV SOLN
INTRAVENOUS | Status: DC | PRN
  Administered 2012-05-25: 100 mg via INTRAVENOUS

## 2012-05-25 MED ORDER — PROPOFOL 10 MG/ML IV BOLUS
INTRAVENOUS | Status: DC | PRN
  Administered 2012-05-25: 100 mg via INTRAVENOUS

## 2012-05-25 MED ORDER — METOPROLOL TARTRATE 1 MG/ML IV SOLN
INTRAVENOUS | Status: DC | PRN
  Administered 2012-05-25: 5 mg via INTRAVENOUS

## 2012-05-25 MED ORDER — SODIUM CHLORIDE 0.9 % IV SOLN
Freq: Once | INTRAVENOUS | Status: AC
  Administered 2012-05-25: 500 mL via INTRAVENOUS

## 2012-05-25 MED ORDER — METOPROLOL TARTRATE 1 MG/ML IV SOLN: 5.0000 mg | INTRAVENOUS | Status: DC

## 2012-05-25 MED ORDER — SODIUM CHLORIDE 0.9 % IV SOLN
INTRAVENOUS | Status: DC | PRN
  Administered 2012-05-25: 10:00:00 via INTRAVENOUS

## 2012-05-25 MED ORDER — METOPROLOL TARTRATE 1 MG/ML IV SOLN
INTRAVENOUS | Status: AC
  Filled 2012-05-25: qty 5

## 2012-05-25 NOTE — Transfer of Care (Signed)
Immediate Anesthesia Transfer of Care Note  Patient: Austin Escobar  Procedure(s) Performed: Procedure(s): CARDIOVERSION (N/A)  Patient Location: PACU and Endoscopy Unit  Anesthesia Type:MAC  Level of Consciousness: awake, alert , oriented and patient cooperative  Airway & Oxygen Therapy: Patient Spontanous Breathing and Patient connected to nasal cannula oxygen  Post-op Assessment: Report given to PACU RN, Post -op Vital signs reviewed and stable and Patient moving all extremities X 4  Post vital signs: Reviewed and stable  Complications: No apparent anesthesia complications

## 2012-05-25 NOTE — CV Procedure (Signed)
See previous note Afib DCC 100mg  propofol.  Converted to NSR rate 92 On Rx xarelto  [employer]

## 2012-05-25 NOTE — H&P (View-Only) (Signed)
Patient ID: Austin Escobar, male   DOB: Aug 20, 1955, 57 y.o.   MRN: [id number] 57 yo referred by Dr Kirtland Bouchard for afib. Patient is asymptomatic. Likely has HTN not Rx until a week ago. CHADVASC2 score only 1. Admits to daily excessive beer consumption. 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 Echo with no valve disease normal EF and moderate LAE. He needs a colonoscopy in the near future. Discussed options of ASA and rate control since he is asymptomatic. No reason to [facility] if he cannot stop ETOH as he will revert. Also no reason to start anticoagulant if it needs to be stopped for colonoscopy. Will see again end of February and he will let me know how ETOH is going and decide if he wants and attempt at [facility]. No chest pain, dyspnea or palpitations. Abdominal pain is improved since seeing Dr Kirtland Bouchard  Study Conclusions 03/1512   - Left ventricle: The cavity size was normal. Wall thickness was increased in a pattern of moderate LVH. Systolic function was vigorous. The estimated ejection fraction was in the range of 65% to 70%. - Left atrium: The atrium was moderately dilated.  Has cut back on drinking  Willing to do [facility]  Discussed starting xarelto No contraindication to [facility]   ROS: Denies fever, malais, weight loss, blurry vision, decreased visual acuity, cough, sputum, SOB, hemoptysis, pleuritic pain, palpitaitons, heartburn, abdominal pain, melena, lower extremity edema, claudication, or rash.  All other systems reviewed and negative  General: Affect appropriate Healthy:  appears stated age HEENT: normal Neck supple with no adenopathy JVP normal no bruits no thyromegaly Lungs clear with no wheezing and good diaphragmatic motion Heart:  S1/S2 no murmur, no rub, gallop or click PMI normal Abdomen: benighn, BS positve, no tenderness, no AAA no bruit.  No HSM or HJR Distal pulses intact with no bruits No edema Neuro non-focal Skin warm and dry No muscular weakness   Current  Outpatient Prescriptions  Medication Sig Dispense Refill  . aspirin 81 MG EC tablet Take 1 tablet (81 mg total) by mouth daily. Swallow whole.  30 tablet  12  . ASTAXANTHIN PO Take 2 tablets by mouth daily.      . Cream Base (PCCA LIPODERM BASE) CREA Apply topically. 10 mg daily rubbed into hairless area on body, hips       . diltiazem (DILACOR XR) 240 MG 24 hr capsule Take 1 capsule (240 mg total) by mouth daily.  30 capsule  2  . fish oil-omega-3 fatty acids 1000 MG capsule Take 2 g by mouth daily.      Marland Kitchen Hyaluronic Acid-Vitamin C (HYALURONIC ACID PO) Take 2 tablets by mouth daily.      . hyoscyamine (LEVSIN, ANASPAZ) 0.125 MG tablet Take 1 tablet (0.125 mg total) by mouth every 6 (six) hours as needed for cramping.  30 tablet  1  . MULTIPLE VITAMIN PO Take 1 tablet by mouth daily.      . tadalafil (CIALIS) 20 MG tablet Take 20 mg by mouth daily as needed.       No current facility-administered medications for this visit.    Allergies  Review of patient's allergies indicates no known allergies.  Electrocardiogram:  Afib rate 89  1/14  Assessment and Plan

## 2012-05-25 NOTE — Progress Notes (Signed)
APPT MADE WITH DR Eden Emms FOR  06-25-12 at   2:30  PT AWARE .Austin Escobar

## 2012-05-25 NOTE — Preoperative (Signed)
Beta Blockers   Reason not to administer Beta Blockers:Not Applicable 

## 2012-05-25 NOTE — Interval H&P Note (Signed)
History and Physical Interval Note:  05/25/2012 9:31 AM  Delbert Harness  has presented today for surgery, with the diagnosis of A-Fib  The various methods of treatment have been discussed with the patient and family. After consideration of risks, benefits and other options for treatment, the patient has consented to  Procedure(s): CARDIOVERSION (N/A) as a surgical intervention .  The patient's history has been reviewed, patient examined, no change in status, stable for surgery.  I have reviewed the patient's chart and labs.  Questions were answered to the patient's satisfaction.     Charlton Haws

## 2012-05-25 NOTE — CV Procedure (Signed)
DCC  On Rx xarelto for over 3 weeks. Confirmed compliance Pre Cardioversion Afib rate 118 100 mg propofol DCC x1 120 J biphasic Converted to NSR rate 95  Given lopresser 5 mg iv  Successful DCC no immediate neurlogical seaquelae  Charlton Haws

## 2012-05-25 NOTE — Anesthesia Postprocedure Evaluation (Signed)
  Anesthesia Post-op Note  Patient: Austin Escobar  Procedure(s) Performed: Procedure(s): CARDIOVERSION (N/A)  Patient Location: PACU and Endoscopy Unit  Anesthesia Type:MAC  Level of Consciousness: awake, alert , oriented and patient cooperative  Airway and Oxygen Therapy: Patient Spontanous Breathing  Post-op Pain: none  Post-op Assessment: Post-op Vital signs reviewed, Patient's Cardiovascular Status Stable and Respiratory Function Stable  Post-op Vital Signs: Reviewed and stable  Complications: No apparent anesthesia complications

## 2012-05-25 NOTE — Anesthesia Preprocedure Evaluation (Addendum)
Anesthesia Evaluation  Patient identified by MRN, date of birth, ID band Patient awake    Reviewed: Allergy & Precautions, H&P , NPO status , Patient's Chart, lab work & pertinent test results  Airway Mallampati: II TM Distance: <3 FB     Dental  (+) Teeth Intact and Dental Advisory Given   Pulmonary          Cardiovascular hypertension, Pt. on medications + dysrhythmias Atrial Fibrillation     Neuro/Psych    GI/Hepatic   Endo/Other    Renal/GU      Musculoskeletal   Abdominal   Peds  Hematology   Anesthesia Other Findings   Reproductive/Obstetrics                         Anesthesia Physical Anesthesia Plan  ASA: II  Anesthesia Plan: General   Post-op Pain Management:    Induction: Intravenous  Airway Management Planned: Mask  Additional Equipment:   Intra-op Plan:   Post-operative Plan:   Informed Consent: I have reviewed the patients History and Physical, chart, labs and discussed the procedure including the risks, benefits and alternatives for the proposed anesthesia with the patient or authorized representative who has indicated his/her understanding and acceptance.     Plan Discussed with: CRNA and Surgeon  Anesthesia Plan Comments:        Anesthesia Quick Evaluation

## 2012-05-26 ENCOUNTER — Encounter (HOSPITAL_COMMUNITY): Payer: Self-pay | Admitting: Cardiovascular Disease

## 2012-05-27 ENCOUNTER — Other Ambulatory Visit: Payer: Self-pay | Admitting: *Deleted

## 2012-05-27 MED ORDER — HYOSCYAMINE SULFATE 0.125 MG PO TABS: 0.1250 mg | ORAL_TABLET | Freq: Four times a day (QID) | ORAL | Status: DC | PRN

## 2012-05-27 NOTE — Telephone Encounter (Signed)
#  90 okay per Dr [name]

## 2012-05-29 ENCOUNTER — Ambulatory Visit (INDEPENDENT_AMBULATORY_CARE_PROVIDER_SITE_OTHER): Payer: BC Managed Care – PPO | Admitting: Internal Medicine

## 2012-05-29 ENCOUNTER — Encounter: Payer: Self-pay | Admitting: Internal Medicine

## 2012-05-29 VITALS — BP 140/90 | HR 100 | Temp 98.4°F | Wt 240.0 lb

## 2012-05-29 DIAGNOSIS — R1032 Left lower quadrant pain: Secondary | ICD-10-CM

## 2012-05-29 DIAGNOSIS — D751 Secondary polycythemia: Secondary | ICD-10-CM

## 2012-05-29 MED ORDER — DICYCLOMINE HCL 10 MG PO CAPS: 10.0000 mg | ORAL_CAPSULE | Freq: Three times a day (TID) | ORAL | Status: DC | PRN

## 2012-05-29 NOTE — Assessment & Plan Note (Signed)
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 His testosterone replacement dose was reduced by his urologist. Patient donated 2 units of blood. His hemoglobin is still greater than 16. Patient advised to donate additional 1 to 2 units.  Repeat CBCD in 3 months.

## 2012-05-29 NOTE — Patient Instructions (Addendum)
Our office will contact you re: CT of abdomen and pelvis results
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
## 2012-05-29 NOTE — Assessment & Plan Note (Signed)
57 year old white male with worsening left lower quadrant pain. Recent colonoscopy showed 4 small sessile polyps and severe diverticulosis of sigmoid colon. On exam his left lower quadrant is minimally tender. I doubt his symptoms are secondary to diverticulitis. Patient having unexplained anorexia and intermittent mid abdominal / epigastric pain. Proceed with CT of abdomen and pelvis with oral and IV contrast.

## 2012-06-01 ENCOUNTER — Ambulatory Visit (INDEPENDENT_AMBULATORY_CARE_PROVIDER_SITE_OTHER)
Admission: RE | Admit: 2012-06-01 | Discharge: 2012-06-01 | Disposition: A | Discharge status: Home / Self Care | Payer: BC Managed Care – PPO | Source: Ambulatory Visit | Attending: Internal Medicine | Admitting: Internal Medicine

## 2012-06-01 DIAGNOSIS — R1032 Left lower quadrant pain: Secondary | ICD-10-CM

## 2012-06-01 MED ORDER — IOHEXOL 300 MG/ML  SOLN
100.0000 mL | Freq: Once | INTRAMUSCULAR | Status: AC | PRN
  Administered 2012-06-01: 100 mL via INTRAVENOUS

## 2012-06-02 ENCOUNTER — Ambulatory Visit (INDEPENDENT_AMBULATORY_CARE_PROVIDER_SITE_OTHER): Payer: BC Managed Care – PPO | Admitting: Internal Medicine

## 2012-06-02 ENCOUNTER — Encounter: Payer: Self-pay | Admitting: Internal Medicine

## 2012-06-02 VITALS — BP 130/90 | Temp 99.0°F | Wt 234.0 lb

## 2012-06-02 DIAGNOSIS — C259 Malignant neoplasm of pancreas, unspecified: Secondary | ICD-10-CM

## 2012-06-02 NOTE — Assessment & Plan Note (Signed)
57 year old white male previously seen for persistent left upper quadrant and epigastric pain Completed CT of abdomen and pelvis. 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 Also noted superior mesenteric artery and portal vein/splenoportal confluence involvement with regional nodal metastasis.  CT scan results reviewed in detail the patient.   Refer to Dr. Christella Hartigan for possible EUS biopsy for tissue confirmation.  Check CA 19-9.  Refer to oncology - Dr. [name] to discuss tx options.  I suspect poor prognosis if pancreatic cancer is confirmed.
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
## 2012-06-03 ENCOUNTER — Telehealth: Payer: Self-pay | Admitting: Hematology & Oncology

## 2012-06-03 LAB — CANCER ANTIGEN 19-9: CA 19-9: 3875.6 U/mL — ABNORMAL HIGH (ref <35.0)

## 2012-06-03 NOTE — Telephone Encounter (Signed)
Pt aware of 4-3 appointment °

## 2012-06-04 ENCOUNTER — Telehealth: Payer: Self-pay

## 2012-06-04 ENCOUNTER — Encounter: Payer: Self-pay | Admitting: Internal Medicine

## 2012-06-04 ENCOUNTER — Ambulatory Visit (HOSPITAL_BASED_OUTPATIENT_CLINIC_OR_DEPARTMENT_OTHER): Payer: BC Managed Care – PPO | Admitting: Hematology & Oncology

## 2012-06-04 ENCOUNTER — Ambulatory Visit: Payer: BC Managed Care – PPO

## 2012-06-04 ENCOUNTER — Other Ambulatory Visit: Payer: Self-pay

## 2012-06-04 ENCOUNTER — Other Ambulatory Visit (HOSPITAL_BASED_OUTPATIENT_CLINIC_OR_DEPARTMENT_OTHER): Payer: BC Managed Care – PPO | Admitting: Lab

## 2012-06-04 ENCOUNTER — Encounter (HOSPITAL_COMMUNITY): Payer: Self-pay | Admitting: *Deleted

## 2012-06-04 VITALS — BP 128/91 | HR 91 | Temp 98.2°F | Resp 18 | Ht 73.0 in | Wt 234.0 lb

## 2012-06-04 DIAGNOSIS — Z8547 Personal history of malignant neoplasm of testis: Secondary | ICD-10-CM

## 2012-06-04 DIAGNOSIS — C259 Malignant neoplasm of pancreas, unspecified: Secondary | ICD-10-CM

## 2012-06-04 DIAGNOSIS — K909 Intestinal malabsorption, unspecified: Secondary | ICD-10-CM

## 2012-06-04 DIAGNOSIS — K8689 Other specified diseases of pancreas: Secondary | ICD-10-CM

## 2012-06-04 DIAGNOSIS — R935 Abnormal findings on diagnostic imaging of other abdominal regions, including retroperitoneum: Secondary | ICD-10-CM

## 2012-06-04 DIAGNOSIS — K5730 Diverticulosis of large intestine without perforation or abscess without bleeding: Secondary | ICD-10-CM

## 2012-06-04 LAB — CBC WITH DIFFERENTIAL (CANCER CENTER ONLY)
BASO#: 0 10*3/uL (ref 0.0–0.2)
EOS%: 1.3 % (ref 0.0–7.0)
MCH: 33.7 pg — ABNORMAL HIGH (ref 28.0–33.4)
MCHC: 36.2 g/dL — ABNORMAL HIGH (ref 32.0–35.9)
MONO%: 13 % (ref 0.0–13.0)
NEUT#: 5.2 10*3/uL (ref 1.5–6.5)
Platelets: 185 10*3/uL (ref 145–400)
RDW: 12.8 % (ref 11.1–15.7)

## 2012-06-04 MED ORDER — PANCRELIPASE (LIP-PROT-AMYL) 24000-76000 UNITS PO CPEP: ORAL_CAPSULE | ORAL | Status: DC

## 2012-06-04 NOTE — Telephone Encounter (Signed)
Message copied by LEWIS, Jimmey Hengel L on Thu Jun 04, 2012  8:09 AM ------      Message from: Rob Bunting P      Created: Thu Jun 04, 2012  7:36 AM      Regarding: RE: pancreatic mass       Alexia Freestone,      This man needs upper EUS, with MAC, anytime next week that will work (during hosp week).  The only time I cannot do it is Monday 7:30.  If cannot be done at endo with MAC, then will need in OR with MAC at [facility] next week (really want to do it next week).  60 min, linear only, pancreatic mass dx:  He will need cbc, cmet in [facility] endo admitting the mornign of the procedure.            Rob,      He is on xarelto, will need to come off this for 2 days prior to EUS, FNA. Can you let me know if that is safe to hold.            Baldo Ash,      I'm going to get him in for EUS next week.            thanks            ----- Message -----         From: Iva Boop, MD         Sent: 06/03/2012  12:23 PM           To: Rachael Fee, MD      Subject: pancreatic mass                                          This guy has a pancreatic mass - Alfredo Batty asked me about it and I said we could get him connected to you for EUS            At this point he has a 4/15 appt w/ me            I am willing to see him and help set up the EUS but wanted your input in light of vacation and scheduling, etc.            Thanks as always            Baldo Ash       ------

## 2012-06-04 NOTE — Progress Notes (Signed)
This office note has been dictated.

## 2012-06-04 NOTE — Telephone Encounter (Signed)
Left message on machine to call back waiting on Dr [name] recommendations for xarelto pt needs to be instructed and meds reviewed

## 2012-06-05 ENCOUNTER — Telehealth: Payer: Self-pay | Admitting: Gastroenterology

## 2012-06-05 ENCOUNTER — Encounter (HOSPITAL_COMMUNITY): Payer: Self-pay | Admitting: Pharmacy Technician

## 2012-06-05 ENCOUNTER — Telehealth: Payer: Self-pay | Admitting: Internal Medicine

## 2012-06-05 LAB — COMPREHENSIVE METABOLIC PANEL
AST: 19 U/L (ref 0–37)
Albumin: 4.3 g/dL (ref 3.5–5.2)
Alkaline Phosphatase: 67 U/L (ref 39–117)
BUN: 13 mg/dL (ref 6–23)
Potassium: 3.7 mEq/L (ref 3.5–5.3)
Sodium: 135 mEq/L (ref 135–145)

## 2012-06-05 NOTE — Telephone Encounter (Signed)
Message copied by Rachael Fee on Fri Jun 05, 2012  3:49 PM ------      Message from: Meda Coffee      Created: Fri Jun 05, 2012  3:31 PM      Regarding: RE: pancreatic mass       Ok.  Thanks.                  ----- Message -----         From: Wendall Stade, MD         Sent: 06/05/2012   1:54 PM           To: Iva Boop, MD, Doe-Hyun Sherran Needs, DO      Subject: RE: pancreatic mass                                      He was just cardioverted 3/24  Xarelto should not be stopped for at least 2 more weeks  !!!!!            ----- Message -----         From: Meda Coffee, DO         Sent: 06/04/2012   1:02 PM           To: Wendall Stade, MD      Subject: FW: pancreatic mass                                      Cindee Lame,            I am going to instruct patient to hold his xarelto 48 hrs prior to his procedure.  When would you recommend we restart his anticoagulation? 24 hrs after procedure?                  Rob      ----- Message -----         From: Iva Boop, MD         Sent: 06/04/2012   8:28 AM           To: Rachael Fee, MD, Donata Duff, CMA, #      Subject: RE: pancreatic mass                                      I will call him today to explain things since I have recently seen him            THANKS      ----- Message -----         From: Rachael Fee, MD         Sent: 06/04/2012   7:36 AM           To: Iva Boop, MD, Donata Duff, CMA, #      Subject: RE: pancreatic mass                                      Patty,      This man needs upper EUS, with MAC, anytime next week that will work (during hosp week).  The  only time I cannot do it is Monday 7:30.  If cannot be done at endo with MAC, then will need in OR with MAC at [facility] next week (really want to do it next week).  60 min, linear only, pancreatic mass dx:  He will need cbc, cmet in [facility] endo admitting the mornign of the procedure.            Rob,      He is on xarelto, will need to come off this for  2 days prior to EUS, FNA. Can you let me know if that is safe to hold.            Baldo Ash,      I'm going to get him in for EUS next week.            thanks            ----- Message -----         From: Iva Boop, MD         Sent: 06/03/2012  12:23 PM           To: Rachael Fee, MD      Subject: pancreatic mass                                          This guy has a pancreatic mass - Alfredo Batty asked me about it and I said we could get him connected to you for EUS            At this point he has a 4/15 appt w/ me            I am willing to see him and help set up the EUS but wanted your input in light of vacation and scheduling, etc.            Thanks as always            Baldo Ash                               ------

## 2012-06-05 NOTE — Progress Notes (Signed)
CC:   Robert D. Yoo, DO Carl E. 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  HISTORY OF PRESENT ILLNESS:  Austin Escobar is a really nice 57-year-old white gentleman.  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  He underwent curative radiation.  He received a dose of 2800 rad to the bilateral periaortic and right pelvic lymph nodes.  He has been doing well.  This has not been an issue for him.  Recently he has been having some problems with abdominal pain.  This is in the left upper quadrant.  He has seen Dr. Carl Gessner.  Dr. Gessner did, I think, a colonoscopy on him.  The colonoscopy showed some diverticulosis.  He continued to have some pain issues.  He then saw Dr. Yoo, who is his family doctor.  Dr. Yoo went ahead and ordered a CT scan.  This was done on 06/01/2012.  This, unfortunately, showed a mass in the body of the pancreas.  This measured 2.6 x 2.8 cm.  This was associated with the superior mesenteric artery.  Also involved what appeared to be the splenoportal confluence and inferior-most aspect of the portal vein. Also noted was some lymphadenopathy.  He has a new 1.1-cm retrocaval lymph node.  He has a 1.3-cm aortocaval lymph node.  Also noted was adenopathy at the root of the jejunal mesentery.  He has not had a biopsy as of yet.  He is being set up for an endoscopic ultrasound biopsy next week.  He did have some lab work done.  Dr. Yoo sent off a CA19-9. Unfortunately, this was quite elevated at 3875.  Dr. Yoo kindly referred Mr. Azpeitia to the [facility] for evaluation and recommendations.  Mr. Aken looks very fit.  He really has not lost weight.  He has been working.  He has had a good appetite.  He has been having some diarrhea since the colonoscopy.  He has had no cough or shortness of breath.  He has had no fevers, sweats or chills.  There has  been no bleeding.  He has had no leg swelling.  PAST MEDICAL HISTORY:  His past medical history is remarkable for the following: 1. Stage I testicular cancer, status post resection/radiation therapy. 2. Atrial fibrillation, status post ablation. 3. Hypertension. 4. History of asthma.  ALLERGIES:  None.  MEDICATIONS: 1. Testicular cream applied daily. 2. Bentyl 10 mg t.i.d. p.r.n. 3. Diltiazem XR 240 mg p.o. daily. 4. Xarelto 20 mg p.o. daily. 5. Cialis 20 mg as needed. 6. Aspirin 81 mg p.o. daily.  SOCIAL HISTORY:  Negative for tobacco use.  He has some alcohol use.  FAMILY HISTORY:  Remarkable for 3 maternal uncles who all died of cancer.  He is not sure what kind they had.  He is not sure if his mother died of cancer.  His father passed away from diabetes and hypertension.  REVIEW OF SYSTEMS:  As stated in the History of Present Illness.  No additional findings noted on a 12-system review.  PHYSICAL EXAMINATION:  General:  This is a well-developed, well- nourished white gentleman in no obvious distress.  Vital signs: Temperature of 98.2, pulse 91, respiratory rate 18, blood pressure 128/91.  Weight is 234.  Head and neck:  Normocephalic, atraumatic skull.  There are no ocular or oral lesions.  There is no scleral icterus.  There is no adenopathy in the neck.    Thyroid is not palpable. Lungs:  Clear to percussion and auscultation bilaterally.  There are no rales, wheezes, or rhonchi.  Cardiac:  Regular rate and rhythm, with a normal S1 and S2.  There are no murmurs, rubs, or bruits.  Abdomen: Soft.  He has good bowel sounds.  There may be some slight tenderness in the left upper quadrant.  There is no distinct mass that is palpable. He has no fluid wave.  There is no palpable hepatosplenomegaly.  Back: Shows no tenderness over the spine, ribs, or hips.  Extremities:  Show no clubbing, cyanosis, or edema.  Neurological:  Shows no focal neurological deficits.  LABORATORY  STUDIES:  White cell count is 7.6, hemoglobin 17, hematocrit of 47, platelet count 185,000.  IMPRESSION:  Mr. Lucchetti is a nice 57-year-old white gentleman who looks like he has metastatic pancreatic cancer.  One would think by the CT scan appearance and the CA-19-9 that this is metastatic.  He has a good performance status, so one can certainly be aggressive with systemic chemotherapy.  I find it very interesting that he had radiation therapy for testicular cancer 11 years ago.  It is conceivable that this pancreatic cancer may be a long-term complication from that radiation therapy.  Typically, however, I think that most GI cancers related to testicular radiation therapy are sarcomas and more rare soft-tissue-type tumors.  We are going to have to get the biopsy confirmation.  This will be done next week with endoscopic ultrasound.  This also will be able to give a good assessment of the relationship with the regional vessels.  I also want to get a PET scan on him.  This, hopefully, will give me an idea as to the extent of his disease.  I spoke to Mr. Alcon about this.  I explained to him that we were likely looking at metastatic pancreatic cancer.  I told him that this is not curable, but treatable.  I was encouraging that he was in good shape, so he could take systemic chemotherapy.  He may have some malabsorption issues.  I ordered some Creon for him to take with his meals to see if this did not help with the diarrhea.  Mr. Gaul fully understands the situation that we are in.  He understands that we are likely dealing with a noncurable disease.  Once we get results of the PET scan and the biopsy results from the endoscopic ultrasound, then we will get him back so we can talk about systemic therapy.  I spent a good hour or more with Mr. Grealish today.    ______________________________ Draylen R Ennever, M.D. PRE/MEDQ  D:  06/04/2012  T:  06/05/2012  Job:  [id number] 

## 2012-06-05 NOTE — Telephone Encounter (Signed)
I spoke to him He has heard from [facility] and knows what to do to get ready for EUS/FNA  He will stop taking xarelto 2 days before - he is just finishing a bottle after cardioversion - he has discussed with Ennever also  We will cancel 4/15 appointment he has with me

## 2012-06-05 NOTE — Telephone Encounter (Signed)
Pt was mailed out a new packet of information with the correct instructions

## 2012-06-05 NOTE — Telephone Encounter (Signed)
Austin Escobar, thanks  Rob and I spoke, will postpone this until later in the month, probably on the 24th.  I already talked with [name] about the change in plans.

## 2012-06-05 NOTE — Telephone Encounter (Signed)
See alternate note  

## 2012-06-10 ENCOUNTER — Ambulatory Visit (HOSPITAL_BASED_OUTPATIENT_CLINIC_OR_DEPARTMENT_OTHER)
Admission: RE | Admit: 2012-06-10 | Discharge: 2012-06-10 | Disposition: A | Discharge status: Home / Self Care | Payer: BC Managed Care – PPO | Source: Ambulatory Visit | Attending: Hematology & Oncology | Admitting: Hematology & Oncology

## 2012-06-10 DIAGNOSIS — C259 Malignant neoplasm of pancreas, unspecified: Secondary | ICD-10-CM

## 2012-06-10 DIAGNOSIS — K869 Disease of pancreas, unspecified: Secondary | ICD-10-CM | POA: Insufficient documentation

## 2012-06-10 DIAGNOSIS — R599 Enlarged lymph nodes, unspecified: Secondary | ICD-10-CM | POA: Insufficient documentation

## 2012-06-10 MED ORDER — FLUDEOXYGLUCOSE F - 18 (FDG) INJECTION: 13.6400 | Freq: Once | INTRAVENOUS | Status: AC | PRN

## 2012-06-12 ENCOUNTER — Other Ambulatory Visit: Payer: BC Managed Care – PPO

## 2012-06-16 ENCOUNTER — Ambulatory Visit: Payer: BC Managed Care – PPO | Admitting: Internal Medicine

## 2012-06-19 ENCOUNTER — Encounter: Payer: BC Managed Care – PPO | Admitting: Internal Medicine

## 2012-06-25 ENCOUNTER — Encounter (HOSPITAL_COMMUNITY): Payer: Self-pay

## 2012-06-25 ENCOUNTER — Ambulatory Visit: Payer: BC Managed Care – PPO | Admitting: Cardiovascular Disease

## 2012-06-25 ENCOUNTER — Ambulatory Visit (HOSPITAL_COMMUNITY): Payer: BC Managed Care – PPO | Admitting: Anesthesiology

## 2012-06-25 ENCOUNTER — Ambulatory Visit (HOSPITAL_COMMUNITY)
Admission: RE | Admit: 2012-06-25 | Discharge: 2012-06-25 | Disposition: A | Discharge status: Home / Self Care | Payer: BC Managed Care – PPO | Source: Ambulatory Visit | Attending: Gastroenterology | Admitting: Gastroenterology

## 2012-06-25 ENCOUNTER — Encounter (HOSPITAL_COMMUNITY): Payer: Self-pay | Admitting: Anesthesiology

## 2012-06-25 ENCOUNTER — Encounter (HOSPITAL_COMMUNITY)
Admission: RE | Disposition: A | Discharge status: Home / Self Care | Payer: Self-pay | Source: Ambulatory Visit | Attending: Gastroenterology

## 2012-06-25 DIAGNOSIS — C250 Malignant neoplasm of head of pancreas: Secondary | ICD-10-CM | POA: Insufficient documentation

## 2012-06-25 DIAGNOSIS — R599 Enlarged lymph nodes, unspecified: Secondary | ICD-10-CM | POA: Insufficient documentation

## 2012-06-25 DIAGNOSIS — J45909 Unspecified asthma, uncomplicated: Secondary | ICD-10-CM | POA: Insufficient documentation

## 2012-06-25 DIAGNOSIS — I10 Essential (primary) hypertension: Secondary | ICD-10-CM | POA: Insufficient documentation

## 2012-06-25 DIAGNOSIS — Z8547 Personal history of malignant neoplasm of testis: Secondary | ICD-10-CM | POA: Insufficient documentation

## 2012-06-25 DIAGNOSIS — K8689 Other specified diseases of pancreas: Secondary | ICD-10-CM

## 2012-06-25 DIAGNOSIS — K869 Disease of pancreas, unspecified: Secondary | ICD-10-CM

## 2012-06-25 DIAGNOSIS — I4891 Unspecified atrial fibrillation: Secondary | ICD-10-CM | POA: Insufficient documentation

## 2012-06-25 DIAGNOSIS — Z923 Personal history of irradiation: Secondary | ICD-10-CM | POA: Insufficient documentation

## 2012-06-25 DIAGNOSIS — Z7982 Long term (current) use of aspirin: Secondary | ICD-10-CM | POA: Insufficient documentation

## 2012-06-25 DIAGNOSIS — Z79899 Other long term (current) drug therapy: Secondary | ICD-10-CM | POA: Insufficient documentation

## 2012-06-25 HISTORY — PX: EUS: SHX5427

## 2012-06-25 HISTORY — DX: Other specified diseases of pancreas: K86.89

## 2012-06-25 SURGERY — UPPER ENDOSCOPIC ULTRASOUND (EUS) LINEAR
Anesthesia: Monitor Anesthesia Care

## 2012-06-25 MED ORDER — MIDAZOLAM HCL 5 MG/5ML IJ SOLN
INTRAMUSCULAR | Status: DC | PRN
  Administered 2012-06-25 (×2): 1 mg via INTRAVENOUS

## 2012-06-25 MED ORDER — GLYCOPYRROLATE 0.2 MG/ML IJ SOLN
INTRAMUSCULAR | Status: DC | PRN
  Administered 2012-06-25: 0.2 mg via INTRAVENOUS

## 2012-06-25 MED ORDER — PROPOFOL 10 MG/ML IV EMUL
INTRAVENOUS | Status: DC | PRN
  Administered 2012-06-25: 140 ug/kg/min via INTRAVENOUS

## 2012-06-25 MED ORDER — LACTATED RINGERS IV SOLN
INTRAVENOUS | Status: DC
  Administered 2012-06-25: 13:00:00 via INTRAVENOUS

## 2012-06-25 MED ORDER — LIDOCAINE HCL (CARDIAC) 20 MG/ML IV SOLN
INTRAVENOUS | Status: DC | PRN
  Administered 2012-06-25: 100 mg via INTRAVENOUS

## 2012-06-25 MED ORDER — FENTANYL CITRATE 0.05 MG/ML IJ SOLN
INTRAMUSCULAR | Status: DC | PRN
  Administered 2012-06-25 (×2): 50 ug via INTRAVENOUS

## 2012-06-25 MED ORDER — SODIUM CHLORIDE 0.9 % IV SOLN: INTRAVENOUS | Status: DC

## 2012-06-25 MED ORDER — BUTAMBEN-TETRACAINE-BENZOCAINE 2-2-14 % EX AERO
INHALATION_SPRAY | CUTANEOUS | Status: DC | PRN
  Administered 2012-06-25: 2 via TOPICAL

## 2012-06-25 NOTE — Op Note (Signed)
[facility] [address] [place], [address]   ENDOSCOPIC ULTRASOUND PROCEDURE REPORT  PATIENT: Austin Escobar, Austin Escobar  MR#: [id number] BIRTHDATE: [birth year]  GENDER: Male ENDOSCOPIST: Rachael Fee, MD REFERRED BY:  Arlan Organ, M.D. PROCEDURE DATE:  06/25/2012 PROCEDURE:   Upper EUS w/FNA ASA CLASS:      Class II INDICATIONS:    weight loss, CT scan showing mass in pancreatic head, maligant appearing regional and distant adenopathy on PET scan, CA 19-9 3800. MEDICATIONS: MAC sedation, administered by CRNA  DESCRIPTION OF PROCEDURE:   After the risks benefits and alternatives of the procedure were  explained, informed consent was obtained. The patient was then placed in the left, lateral, decubitus postion and IV sedation was administered. Throughout the procedure, the patients blood pressure, pulse and oxygen saturations were monitored continuously.  Under direct visualization, the Pentax Radial EUS [id number]  endoscope was introduced through the mouth  and advanced to the second portion of the duodenum .  Water was used as necessary to provide an acoustic interface.  Upon completion of the imaging, water was removed and the patient was sent to the recovery room in satisfactory condition.   Endoscopic findings: 1. Normal UGI tract  EUS findings: 1. Hypoechoic, heterogeneous mass in head of pancreas measuring 3.0cm maximally. The mass is causing main pancreatic duct dilation but bile duct appears normal.  The mass involves the SMA, likely invades the vessel.  The mass was sampled with 3 transduodenal passes with a 25 guage EUS FNA needle under suction. 2. Peripancreatic adenopathy. 3. There was a large subcarinal collection of lymphnodes measuring 3.6cm across. This was very PET avid and so sampled with a single pass with 22 guage EUS FNA needle.  Impression: 3cm mass in head of pancreas with involvement of the SMA, causing dilation of the main  pancreatic duct. There was peripancreatic and subcarinal adenopathy.  Preliminary cytology review shows highly atypical cells (from pancreatic mass FNA), likely maligancy.  Await final reading.   _______________________________ eSigned:  Rachael Fee, MD 06/25/2012 2:33 PM

## 2012-06-25 NOTE — Interval H&P Note (Signed)
History and Physical Interval Note:  06/25/2012 12:53 PM  Austin Escobar  has presented today for surgery, with the diagnosis of Pancreatic mass [577.9]  The various methods of treatment have been discussed with the patient and family. After consideration of risks, benefits and other options for treatment, the patient has consented to  Procedure(s) with comments: UPPER ENDOSCOPIC ULTRASOUND (EUS) LINEAR (N/A) - pt will need CBC and CMET priro to EUS as a surgical intervention .  The patient's history has been reviewed, patient examined, no change in status, stable for surgery.  I have reviewed the patient's chart and labs.  Questions were answered to the patient's satisfaction.     Rob Bunting

## 2012-06-25 NOTE — Anesthesia Preprocedure Evaluation (Addendum)
Anesthesia Evaluation  Patient identified by MRN, date of birth, ID band Patient awake    Reviewed: Allergy & Precautions, H&P , NPO status , Patient's Chart, lab work & pertinent test results  Airway Mallampati: II TM Distance: <3 FB     Dental  (+) Teeth Intact and Dental Advisory Given   Pulmonary asthma ,          Cardiovascular hypertension, Pt. on medications + dysrhythmias Atrial Fibrillation     Neuro/Psych negative neurological ROS     GI/Hepatic negative GI ROS, Neg liver ROS,   Endo/Other  negative endocrine ROS  Renal/GU negative Renal ROS     Musculoskeletal negative musculoskeletal ROS (+)   Abdominal   Peds  Hematology negative hematology ROS (+)   Anesthesia Other Findings   Reproductive/Obstetrics                          Anesthesia Physical  Anesthesia Plan  ASA: III  Anesthesia Plan: MAC   Post-op Pain Management:    Induction: Intravenous  Airway Management Planned: Simple Face Mask  Additional Equipment:   Intra-op Plan:   Post-operative Plan:   Informed Consent: I have reviewed the patients History and Physical, chart, labs and discussed the procedure including the risks, benefits and alternatives for the proposed anesthesia with the patient or authorized representative who has indicated his/her understanding and acceptance.   Dental advisory given  Plan Discussed with: CRNA  Anesthesia Plan Comments:        Anesthesia Quick Evaluation

## 2012-06-25 NOTE — Transfer of Care (Signed)
Immediate Anesthesia Transfer of Care Note  Patient: Austin Escobar  Procedure(s) Performed: Procedure(s) with comments: UPPER ENDOSCOPIC ULTRASOUND (EUS) LINEAR (N/A) - pt will need CBC and CMET priro to EUS  Patient Location: PACU  Anesthesia Type:MAC  Level of Consciousness: awake  Airway & Oxygen Therapy: Patient Spontanous Breathing and Patient connected to nasal cannula oxygen  Post-op Assessment: Report given to PACU RN and Post -op Vital signs reviewed and stable  Post vital signs: Reviewed and stable  Complications: No apparent anesthesia complications

## 2012-06-25 NOTE — H&P (View-Only) (Signed)
CC:   [name]. 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  HISTORY OF PRESENT ILLNESS:  Austin Escobar is a really nice 57 year old white gentleman.  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  He underwent curative radiation.  He received a dose of 2800 rad to the bilateral periaortic and right pelvic lymph nodes.  He has been doing well.  This has not been an issue for him.  Recently he has been having some problems with abdominal pain.  This is in the left upper quadrant.  He has seen Dr. Stan Head.  Dr. [name] did, I think, a colonoscopy on him.  The colonoscopy showed some diverticulosis.  He continued to have some pain issues.  He then saw Dr. [name], who is his family doctor.  Dr. [name] went ahead and ordered a CT scan.  This was done on 06/01/2012.  This, unfortunately, showed a mass in the body of the pancreas.  This measured 2.6 x 2.8 cm.  This was associated with the superior mesenteric artery.  Also involved what appeared to be the splenoportal confluence and inferior-most aspect of the portal vein. Also noted was some lymphadenopathy.  He has a new 1.1-cm retrocaval lymph node.  He has a 1.3-cm aortocaval lymph node.  Also noted was adenopathy at the root of the jejunal mesentery.  He has not had a biopsy as of yet.  He is being set up for an endoscopic ultrasound biopsy next week.  He did have some lab work done.  Dr. [name] sent off a CA19-9. Unfortunately, this was quite elevated at 3875.  Dr. [name] kindly referred Austin Escobar to the [facility] for evaluation and recommendations.  Austin Escobar looks very fit.  He really has not lost weight.  He has been working.  He has had a good appetite.  He has been having some diarrhea since the colonoscopy.  He has had no cough or shortness of breath.  He has had no fevers, sweats or chills.  There has  been no bleeding.  He has had no leg swelling.  PAST MEDICAL HISTORY:  His past medical history is remarkable for the following: 1. Stage I testicular cancer, status post resection/radiation therapy. 2. Atrial fibrillation, status post ablation. 3. Hypertension. 4. History of asthma.  ALLERGIES:  None.  MEDICATIONS: 1. Testicular cream applied daily. 2. Bentyl 10 mg t.i.d. p.r.n. 3. Diltiazem XR 240 mg p.o. daily. 4. Xarelto 20 mg p.o. daily. 5. Cialis 20 mg as needed. 6. Aspirin 81 mg p.o. daily.  SOCIAL HISTORY:  Negative for tobacco use.  He has some alcohol use.  FAMILY HISTORY:  Remarkable for 3 maternal uncles who all died of cancer.  He is not sure what kind they had.  He is not sure if his mother died of cancer.  His father passed away from diabetes and hypertension.  REVIEW OF SYSTEMS:  As stated in the History of Present Illness.  No additional findings noted on a 59-system review.  PHYSICAL EXAMINATION:  General:  This is a well-developed, well- nourished white gentleman in no obvious distress.  Vital signs: Temperature of 98.2, pulse 91, respiratory rate 18, blood pressure 128/91.  Weight is 234.  Head and neck:  Normocephalic, atraumatic skull.  There are no ocular or oral lesions.  There is no scleral icterus.  There is no adenopathy in the neck.  Thyroid is not palpable. Lungs:  Clear to percussion and auscultation bilaterally.  There are no rales, wheezes, or rhonchi.  Cardiac:  Regular rate and rhythm, with a normal S1 and S2.  There are no murmurs, rubs, or bruits.  Abdomen: Soft.  He has good bowel sounds.  There may be some slight tenderness in the left upper quadrant.  There is no distinct mass that is palpable. He has no fluid wave.  There is no palpable hepatosplenomegaly.  Back: Shows no tenderness over the spine, ribs, or hips.  Extremities:  Show no clubbing, cyanosis, or edema.  Neurological:  Shows no focal neurological deficits.  LABORATORY  STUDIES:  White cell count is 7.6, hemoglobin 17, hematocrit of 47, platelet count 185,000.  IMPRESSION:  Austin Escobar is a nice 57 year old white gentleman who looks like he has metastatic pancreatic cancer.  One would think by the CT scan appearance and the CA-19-9 that this is metastatic.  He has a good performance status, so one can certainly be aggressive with systemic chemotherapy.  I find it very interesting that he had radiation therapy for testicular cancer 11 years ago.  It is conceivable that this pancreatic cancer may be a long-term complication from that radiation therapy.  Typically, however, I think that most GI cancers related to testicular radiation therapy are sarcomas and more rare soft-tissue-type tumors.  We are going to have to get the biopsy confirmation.  This will be done next week with endoscopic ultrasound.  This also will be able to give a good assessment of the relationship with the regional vessels.  I also want to get a PET scan on him.  This, hopefully, will give me an idea as to the extent of his disease.  I spoke to Austin Escobar about this.  I explained to him that we were likely looking at metastatic pancreatic cancer.  I told him that this is not curable, but treatable.  I was encouraging that he was in good shape, so he could take systemic chemotherapy.  He may have some malabsorption issues.  I ordered some Creon for him to take with his meals to see if this did not help with the diarrhea.  Austin Escobar fully understands the situation that we are in.  He understands that we are likely dealing with a noncurable disease.  Once we get results of the PET scan and the biopsy results from the endoscopic ultrasound, then we will get him back so we can talk about systemic therapy.  I spent a good hour or more with Austin Escobar today.    ______________________________ Josph Macho, M.D. PRE/MEDQ  D:  06/04/2012  T:  06/05/2012  Job:  [id number]

## 2012-06-25 NOTE — Preoperative (Signed)
Beta Blockers   Reason not to administer Beta Blockers:Not Applicable 

## 2012-06-26 ENCOUNTER — Telehealth: Payer: Self-pay | Admitting: Hematology & Oncology

## 2012-06-26 ENCOUNTER — Other Ambulatory Visit: Payer: Self-pay | Admitting: *Deleted

## 2012-06-26 ENCOUNTER — Encounter (HOSPITAL_COMMUNITY): Payer: Self-pay | Admitting: Gastroenterology

## 2012-06-26 MED ORDER — DILTIAZEM HCL ER 240 MG PO CP24: 240.0000 mg | ORAL_CAPSULE | Freq: Every morning | ORAL | Status: DC

## 2012-06-26 NOTE — Telephone Encounter (Signed)
I left a message for Austin Escobar to let him know that I got the results back from his endoscopic ultrasound biopsy was pancreas. 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 The only treatment options chemotherapy.  I told him to the message that our office will call him and we will get him in next week. I will talk to about chemotherapy and talk to about the prognosis that we are looking at.  If he has any questions he can always give me a call back.  [name]

## 2012-06-26 NOTE — Anesthesia Postprocedure Evaluation (Signed)
Anesthesia Post Note  Patient: Austin Escobar  Procedure(s) Performed: Procedure(s) (LRB): UPPER ENDOSCOPIC ULTRASOUND (EUS) LINEAR (N/A)  Anesthesia type: MAC  Patient location: PACU  Post pain: Pain level controlled  Post assessment: Post-op Vital signs reviewed  Last Vitals: BP 140/98  Pulse 87  Temp(Src) 36.6 C (Oral)  Resp 16  Wt 224 lb (101.606 kg)  BMI 29.56 kg/m2  SpO2 99%  Post vital signs: Reviewed  Level of consciousness: awake  Complications: No apparent anesthesia complications

## 2012-06-30 ENCOUNTER — Other Ambulatory Visit: Payer: Self-pay | Admitting: Hematology & Oncology

## 2012-07-02 ENCOUNTER — Ambulatory Visit: Payer: BC Managed Care – PPO | Admitting: Hematology & Oncology

## 2012-07-03 ENCOUNTER — Ambulatory Visit (HOSPITAL_BASED_OUTPATIENT_CLINIC_OR_DEPARTMENT_OTHER): Payer: BC Managed Care – PPO | Admitting: Hematology & Oncology

## 2012-07-03 VITALS — BP 125/84 | HR 92 | Temp 98.0°F | Resp 16 | Ht 71.0 in | Wt 225.0 lb

## 2012-07-03 DIAGNOSIS — R197 Diarrhea, unspecified: Secondary | ICD-10-CM

## 2012-07-03 DIAGNOSIS — C259 Malignant neoplasm of pancreas, unspecified: Secondary | ICD-10-CM

## 2012-07-03 DIAGNOSIS — C778 Secondary and unspecified malignant neoplasm of lymph nodes of multiple regions: Secondary | ICD-10-CM

## 2012-07-03 DIAGNOSIS — C250 Malignant neoplasm of head of pancreas: Secondary | ICD-10-CM

## 2012-07-03 NOTE — Progress Notes (Signed)
This office note has been dictated.

## 2012-07-03 NOTE — Patient Instructions (Signed)
Nanoparticle Albumin-Bound Paclitaxel injection Abraxane  What is this medicine?  NANOPARTICLE ALBUMIN-BOUND PACLITAXEL (Na no PAHR ti kuhl al BYOO muhn-bound PAK li TAX el) is a chemotherapy drug. It targets fast dividing cells, like cancer cells, and causes these cells to die. This medicine is used to treat advanced breast cancer and advanced lung cancer. This medicine may be used for other purposes; ask your health care provider or pharmacist if you have questions.  What should I tell my health care provider before I take this medicine?  They need to know if you have any of these conditions: -kidney disease -liver disease -low blood counts, like low platelets, red blood cells, or white blood cells -recent or ongoing radiation therapy -an unusual or allergic reaction to paclitaxel, albumin, other chemotherapy, other medicines, foods, dyes, or preservatives -pregnant or trying to get pregnant -breast-feeding  How should I use this medicine?  This drug is given as an infusion into a vein. It is administered in a hospital or clinic by a specially trained health care professional. Talk to your pediatrician regarding the use of this medicine in children. Special care may be needed. Overdosage: If you think you have taken too much of this medicine contact a poison control center or emergency room at once. NOTE: This medicine is only for you. Do not share this medicine with others.  What if I miss a dose?  It is important not to miss your dose. Call your doctor or health care professional if you are unable to keep an appointment.  What may interact with this medicine?  This medicine may also interact with the following medications: -cyclosporine -dexamethasone -diazepam -ketoconazole -medicines to increase blood counts like filgrastim, pegfilgrastim, sargramostim -other chemotherapy drugs like cisplatin, doxorubicin, epirubicin, etoposide, teniposide,  vincristine -quinidine -testosterone -vaccines -verapamil Talk to your doctor or health care professional before taking any of these medicines: -acetaminophen -aspirin -ibuprofen -ketoprofen -naproxen This list may not describe all possible interactions. Give your health care provider a list of all the medicines, herbs, non-prescription drugs, or dietary supplements you use. Also tell them if you smoke, drink alcohol, or use illegal drugs. Some items may interact with your medicine.  What should I watch for while using this medicine?  Your condition will be monitored carefully while you are receiving this medicine. You will need important blood work done while you are taking this medicine. This drug may make you feel generally unwell. This is not uncommon, as chemotherapy can affect healthy cells as well as cancer cells. Report any side effects. Continue your course of treatment even though you feel ill unless your doctor tells you to stop. In some cases, you may be given additional medicines to help with side effects. Follow all directions for their use. Call your doctor or health care professional for advice if you get a fever, chills or sore throat, or other symptoms of a cold or flu. Do not treat yourself. This drug decreases your body's ability to fight infections. Try to avoid being around people who are sick. This medicine may increase your risk to bruise or bleed. Call your doctor or health care professional if you notice any unusual bleeding. Be careful brushing and flossing your teeth or using a toothpick because you may get an infection or bleed more easily. If you have any dental work done, tell your dentist you are receiving this medicine. Avoid taking products that contain aspirin, acetaminophen, ibuprofen, naproxen, or ketoprofen unless instructed by your doctor. These medicines may  hide a fever. Do not become pregnant while taking this medicine. Women should inform their doctor  if they wish to become pregnant or think they might be pregnant. There is a potential for serious side effects to an unborn child. Talk to your health care professional or pharmacist for more information. Do not breast-feed an infant while taking this medicine. Men are advised not to father a child while receiving this medicine.  What side effects may I notice from receiving this medicine?  Side effects that you should report to your doctor or health care professional as soon as possible: -allergic reactions like skin rash, itching or hives, swelling of the face, lips, or tongue -low blood counts - This drug may decrease the number of white blood cells, red blood cells and platelets. You may be at increased risk for infections and bleeding. -signs of infection - fever or chills, cough, sore throat, pain or difficulty passing urine -signs of decreased platelets or bleeding - bruising, pinpoint red spots on the skin, black, tarry stools, nosebleeds -signs of decreased red blood cells - unusually weak or tired, fainting spells, lightheadedness -breathing problems -changes in vision -chest pain -high or low blood pressure -mouth sores -nausea and vomiting -pain, swelling, redness or irritation at the injection site -pain, tingling, numbness in the hands or feet -slow or irregular heartbeat -swelling of the ankle, feet, hands Side effects that usually do not require medical attention (report to your doctor or health care professional if they continue or are bothersome): -aches, pains -changes in the color of fingernails -diarrhea -hair loss -loss of appetite This list may not describe all possible side effects. Call your doctor for medical advice about side effects. You may report side effects to FDA at 1-800-FDA-1088.  Where should I keep my medicine?  This drug is given in a hospital or clinic and will not be stored at home. NOTE: This sheet is a summary. It may not cover all possible  information. If you have questions about this medicine, talk to your doctor, pharmacist, or health care provider.  2013, [employer] Standard. (12/24/2010 4:13:49 PM)  Gemcitabine injection Gemzar  What is this medicine?  GEMCITABINE (jem SIT a been) is a chemotherapy drug. This medicine is used to treat many types of cancer like breast cancer, lung cancer, pancreatic cancer, and ovarian cancer. This medicine may be used for other purposes; ask your health care provider or pharmacist if you have questions.  What should I tell my health care provider before I take this medicine?  They need to know if you have any of these conditions: -blood disorders -infection -kidney disease -liver disease -recent or ongoing radiation therapy -an unusual or allergic reaction to gemcitabine, other chemotherapy, other medicines, foods, dyes, or preservatives -pregnant or trying to get pregnant -breast-feeding  How should I use this medicine?  This drug is given as an infusion into a vein. It is administered in a hospital or clinic by a specially trained health care professional. Talk to your pediatrician regarding the use of this medicine in children. Special care may be needed. Overdosage: If you think you have taken too much of this medicine contact a poison control center or emergency room at once. NOTE: This medicine is only for you. Do not share this medicine with others.  What if I miss a dose?  It is important not to miss your dose. Call your doctor or health care professional if you are unable to keep an appointment.  What may  interact with this medicine?  -medicines to increase blood counts like filgrastim, pegfilgrastim, sargramostim -some other chemotherapy drugs like cisplatin -vaccines Talk to your doctor or health care professional before taking any of these medicines: -acetaminophen -aspirin -ibuprofen -ketoprofen -naproxen This list may not describe all possible  interactions. Give your health care provider a list of all the medicines, herbs, non-prescription drugs, or dietary supplements you use. Also tell them if you smoke, drink alcohol, or use illegal drugs. Some items may interact with your medicine.  What should I watch for while using this medicine?  Visit your doctor for checks on your progress. This drug may make you feel generally unwell. This is not uncommon, as chemotherapy can affect healthy cells as well as cancer cells. Report any side effects. Continue your course of treatment even though you feel ill unless your doctor tells you to stop. In some cases, you may be given additional medicines to help with side effects. Follow all directions for their use. Call your doctor or health care professional for advice if you get a fever, chills or sore throat, or other symptoms of a cold or flu. Do not treat yourself. This drug decreases your body's ability to fight infections. Try to avoid being around people who are sick. This medicine may increase your risk to bruise or bleed. Call your doctor or health care professional if you notice any unusual bleeding. Be careful brushing and flossing your teeth or using a toothpick because you may get an infection or bleed more easily. If you have any dental work done, tell your dentist you are receiving this medicine. Avoid taking products that contain aspirin, acetaminophen, ibuprofen, naproxen, or ketoprofen unless instructed by your doctor. These medicines may hide a fever. Women should inform their doctor if they wish to become pregnant or think they might be pregnant. There is a potential for serious side effects to an unborn child. Talk to your health care professional or pharmacist for more information. Do not breast-feed an infant while taking this medicine.  What side effects may I notice from receiving this medicine?  Side effects that you should report to your doctor or health care professional as soon  as possible: -allergic reactions like skin rash, itching or hives, swelling of the face, lips, or tongue -low blood counts - this medicine may decrease the number of white blood cells, red blood cells and platelets. You may be at increased risk for infections and bleeding. -signs of infection - fever or chills, cough, sore throat, pain or difficulty passing urine -signs of decreased platelets or bleeding - bruising, pinpoint red spots on the skin, black, tarry stools, blood in the urine -signs of decreased red blood cells - unusually weak or tired, fainting spells, lightheadedness -breathing problems -chest pain -mouth sores -nausea and vomiting -pain, swelling, redness at site where injected -pain, tingling, numbness in the hands or feet -stomach pain -swelling of ankles, feet, hands -unusual bleeding Side effects that usually do not require medical attention (report to your doctor or health care professional if they continue or are bothersome): -constipation -diarrhea -hair loss -loss of appetite -stomach upset This list may not describe all possible side effects. Call your doctor for medical advice about side effects. You may report side effects to FDA at 1-800-FDA-1088.  Where should I keep my medicine?  This drug is given in a hospital or clinic and will not be stored at home. NOTE: This sheet is a summary. It may not cover all possible  information. If you have questions about this medicine, talk to your doctor, pharmacist, or health care provider.  2013, [employer] Standard. (06/30/2007 6:45:54 PM)

## 2012-07-04 NOTE — Progress Notes (Signed)
CC:   [name]. [name], DO Rachael Fee, MD Noralyn Pick. Eden Emms, MD, [facility]  DIAGNOSIS:  Metastatic adenocarcinoma of the pancreas.  CURRENT THERAPY:  The patient to start Abraxane/Gemzar next week.  INTERIM HISTORY:  Mr. Woolum comes in for followup.  He has now been diagnosed with metastatic pancreatic cancer.  He did undergo endoscopic ultrasound in April.  This was done on April 24th.  __________ heterogeneous mass in the head of the pancreas measuring 3 cm.  This is causing the main pancreatic ductal dilation.  The mass involves the superior mesenteric artery.  Biopsies were taken.  There was peripancreatic adenopathy.  Also noted was a large subcarinal collection of lymph nodes.  These also were biopsied.  The pathology report ([id number]) showed adenocarcinoma with some tumor necrosis.  We did do a PET scan on him.  The PET scan was done on the 9th of April. This showed metabolic pancreatic head mass with lymph node mesenteric and peripancreatic lymphadenopathy.  Also noted was lymph nodes in the retroperitoneum and mediastinum.  His initial CA 19-9 was 3900.  Apparently he still looks great.  His performance status is ECOG zero.  [place]  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  He has not had any abdominal pain.  He still has had some loose stools.  I did put him on some pancreatic enzyme replacement therapy.  He is also on some Align.  He says this has helped a little bit.  He has had no fever, sweats or chills.  He has had no bleeding.  He has had no nausea or vomiting.  PHYSICAL EXAMINATION:  General:  This is a well-developed, well- nourished white gentleman in no obvious distress.  Vital signs: Temperature 98, pulse 92, respiratory rate 16, blood pressure 125/84. Weight is 225.  Head and neck:  Normocephalic, atraumatic skull.  There are no ocular or oral lesions.  There are no palpable cervical  or supraclavicular lymph nodes.  Lungs:  Clear bilaterally.  Cardiac: Regular rate and rhythm with a normal S1 and S2.  There are no murmurs, rubs or bruits.  Abdomen:  Soft with good bowel sounds.  There is no palpable abdominal mass.  There is no fluid wave.  There is no palpable hepatosplenomegaly.  Back:  No tenderness over the spine, ribs, or hips. Extremities:  No clubbing, cyanosis or edema.  Neurologic:  No focal neurological deficits.  LABORATORY STUDIES:  Were not done on this visit.  IMPRESSION:  Mr. Lamphier is a real nice 57 year old gentleman with metastatic pancreatic cancer.  He, thankfully, does not have liver metastasis.  He has disease in the abdomen and in the chest.  His CA 19- 9 is quite elevated.  He is certainly a candidate for aggressive chemotherapy.  I will go ahead and treat him with Abraxane/Gemzar.  I think this would be very reasonable.  I think he would be able to function.  Again, [place] and hopefully we will allow him to be able to do that.  I went over the side effects of chemotherapy with him and his fiancee. I explained to him the risks of the chemotherapy.  I told him that there was a risk of fatigue.  I also told him that there is a risk that his blood counts will be affected.  We will be checking his  blood counts weekly.  I told him about the possibility of neuropathy.  I told him to start taking vitamin B6 to try to help with the neuropathy.  I explained the nausea and vomiting.  I told him about the diarrhea.  He understands this well.  I told him that I thought that the response rates to chemotherapy might be about 30% or so.  We will go ahead and get started next week.  I will plan to rescan him after 2 cycles of treatment, and we will be following his CA 19-9 with the start of each cycle.  I spent a good hour or so with he and his fiancee.  I answered all of their  questions.    ______________________________ Josph Macho, M.D. PRE/MEDQ  D:  07/03/2012  T:  07/04/2012  Job:  [id number]

## 2012-07-07 ENCOUNTER — Other Ambulatory Visit: Payer: BC Managed Care – PPO

## 2012-07-09 ENCOUNTER — Other Ambulatory Visit: Payer: BC Managed Care – PPO

## 2012-07-09 ENCOUNTER — Encounter: Payer: Self-pay | Admitting: *Deleted

## 2012-07-10 ENCOUNTER — Other Ambulatory Visit: Payer: Self-pay | Admitting: *Deleted

## 2012-07-10 ENCOUNTER — Other Ambulatory Visit (HOSPITAL_BASED_OUTPATIENT_CLINIC_OR_DEPARTMENT_OTHER): Payer: BC Managed Care – PPO | Admitting: Lab

## 2012-07-10 ENCOUNTER — Ambulatory Visit (HOSPITAL_BASED_OUTPATIENT_CLINIC_OR_DEPARTMENT_OTHER): Payer: BC Managed Care – PPO

## 2012-07-10 ENCOUNTER — Encounter: Payer: Self-pay | Admitting: Hematology & Oncology

## 2012-07-10 VITALS — BP 131/87 | HR 79 | Temp 97.2°F

## 2012-07-10 DIAGNOSIS — Z5111 Encounter for antineoplastic chemotherapy: Secondary | ICD-10-CM

## 2012-07-10 DIAGNOSIS — C259 Malignant neoplasm of pancreas, unspecified: Secondary | ICD-10-CM

## 2012-07-10 LAB — CBC WITH DIFFERENTIAL (CANCER CENTER ONLY)
BASO#: 0 10*3/uL (ref 0.0–0.2)
EOS%: 1.9 % (ref 0.0–7.0)
HCT: 45.6 % (ref 38.7–49.9)
HGB: 16 g/dL (ref 13.0–17.1)
LYMPH#: 1 10*3/uL (ref 0.9–3.3)
MONO#: 0.8 10*3/uL (ref 0.1–0.9)
NEUT#: 3.8 10*3/uL (ref 1.5–6.5)
NEUT%: 66.2 % (ref 40.0–80.0)
RBC: 4.83 10*6/uL (ref 4.20–5.70)
WBC: 5.7 10*3/uL (ref 4.0–10.0)

## 2012-07-10 LAB — BASIC METABOLIC PANEL
BUN: 10 mg/dL (ref 6–23)
CO2: 19 mEq/L (ref 19–32)
Chloride: 107 mEq/L (ref 96–112)
Creatinine, Ser: 0.7 mg/dL (ref 0.50–1.35)
Potassium: 3.8 mEq/L (ref 3.5–5.3)

## 2012-07-10 MED ORDER — ONDANSETRON HCL 8 MG PO TABS: 8.0000 mg | ORAL_TABLET | Freq: Two times a day (BID) | ORAL | Status: DC

## 2012-07-10 MED ORDER — LORAZEPAM 1 MG PO TABS: 1.0000 mg | ORAL_TABLET | Freq: Three times a day (TID) | ORAL | Status: DC

## 2012-07-10 MED ORDER — PACLITAXEL PROTEIN-BOUND CHEMO INJECTION 100 MG
125.0000 mg/m2 | Freq: Once | INTRAVENOUS | Status: AC
  Administered 2012-07-10: 275 mg via INTRAVENOUS
  Filled 2012-07-10: qty 55

## 2012-07-10 MED ORDER — SODIUM CHLORIDE 0.9 % IV SOLN
1000.0000 mg/m2 | Freq: Once | INTRAVENOUS | Status: AC
  Administered 2012-07-10: 2242 mg via INTRAVENOUS
  Filled 2012-07-10: qty 59

## 2012-07-10 MED ORDER — ONDANSETRON 8 MG/50ML IVPB (CHCC)
8.0000 mg | Freq: Once | INTRAVENOUS | Status: AC
  Administered 2012-07-10: 8 mg via INTRAVENOUS

## 2012-07-10 MED ORDER — DEXAMETHASONE SODIUM PHOSPHATE 10 MG/ML IJ SOLN
10.0000 mg | Freq: Once | INTRAMUSCULAR | Status: AC
  Administered 2012-07-10: 10 mg via INTRAVENOUS

## 2012-07-10 MED ORDER — DEXAMETHASONE 4 MG PO TABS: 4.0000 mg | ORAL_TABLET | Freq: Two times a day (BID) | ORAL | Status: DC

## 2012-07-10 MED ORDER — PROCHLORPERAZINE MALEATE 10 MG PO TABS: 10.0000 mg | ORAL_TABLET | Freq: Four times a day (QID) | ORAL | Status: DC | PRN

## 2012-07-10 MED ORDER — SODIUM CHLORIDE 0.9 % IV SOLN
Freq: Once | INTRAVENOUS | Status: AC
  Administered 2012-07-10: 10:00:00 via INTRAVENOUS

## 2012-07-10 NOTE — Patient Instructions (Addendum)
 [facility] Discharge Instructions for Patients Receiving Chemotherapy  Today you received the following chemotherapy agents Abraxane, Gemcitabine  To help prevent nausea and vomiting after your treatment, we encourage you to take your nausea medication as prescribed   Take home nausea medications:  1) Zofran (Ondansetron ) 8 mg by mouth.  Take one in the am and one in the pm.  Begin this the day after chemotherapy and take this for 4 days.  2) Decadron (Dexamethaasone) 4 mg by mouth.  Take one in the am and one in the pm with the Zofran.  Begin this the day after chemotherapy and take this for 4 days.    3) Prochlorperazine (Compazine ) 10 mg by mouth.  Take this AS NEEDED for nausea every 8 hours.  4) Ativan (Lorazepam) 1 mg.  Take this AS NEEDED for nausea every 8 hours for nausea, anxiety, sleep  If you develop nausea and vomiting that is not controlled by your nausea medication, call the clinic. If it is after clinic hours your family physician or the after hours number for the clinic or go to the Emergency Department.   BELOW ARE SYMPTOMS THAT SHOULD BE REPORTED IMMEDIATELY:  *FEVER GREATER THAN 100.5 F  *CHILLS WITH OR WITHOUT FEVER  NAUSEA AND VOMITING THAT IS NOT CONTROLLED WITH YOUR NAUSEA MEDICATION  *UNUSUAL SHORTNESS OF BREATH  *UNUSUAL BRUISING OR BLEEDING  TENDERNESS IN MOUTH AND THROAT WITH OR WITHOUT PRESENCE OF ULCERS  *URINARY PROBLEMS  *BOWEL PROBLEMS  UNUSUAL RASH Items with * indicate a potential emergency and should be followed up as soon as possible.  One of the nurses will contact you 24 hours after your treatment. Please let the nurse know about any problems that you may have experienced. Feel free to call the clinic you have any questions or concerns. The clinic phone number is [phone].   I have been informed and understand all the instructions given to me. I know to contact the clinic, my physician, or go to the  Emergency Department if any problems should occur. I do not have any questions at this time, but understand that I may call the clinic during office hours   should I have any questions or need assistance in obtaining follow up care.    __________________________________________  _____________  __________ Signature of Patient or Authorized Representative            Date                   Time    __________________________________________ Nurse's Signature          Nanoparticle Albumin-Bound Paclitaxel injection What is this medicine? NANOPARTICLE ALBUMIN-BOUND PACLITAXEL (Na no PAHR ti kuhl al BYOO muhn-bound PAK li TAX el) is a chemotherapy drug. It targets fast dividing cells, like cancer cells, and causes these cells to die. This medicine is used to treat advanced breast cancer and advanced lung cancer. This medicine may be used for other purposes; ask your health care provider or pharmacist if you have questions. What should I tell my health care provider before I take this medicine? They need to know if you have any of these conditions: -kidney disease -liver disease -low blood counts, like low platelets, red blood cells, or white blood cells -recent or ongoing radiation therapy -an unusual or allergic reaction to paclitaxel, albumin, other chemotherapy, other medicines, foods, dyes, or preservatives -pregnant or trying to get pregnant -breast-feeding How should I use this medicine?   This drug is given as an infusion into a vein. It is administered in a hospital or clinic by a specially trained health care professional. Talk to your pediatrician regarding the use of this medicine in children. Special care may be needed. Overdosage: If you think you have taken too much of this medicine contact a poison control center or emergency room at once. NOTE: This medicine is only for you. Do not share this medicine with others. What if I miss a dose? It is important not to miss your  dose. Call your doctor or health care professional if you are unable to keep an appointment. What may interact with this medicine? This medicine may also interact with the following medications: -cyclosporine -dexamethasone -diazepam -ketoconazole -medicines to increase blood counts like filgrastim, pegfilgrastim, sargramostim -other chemotherapy drugs like cisplatin, doxorubicin, epirubicin, etoposide, teniposide, vincristine -quinidine -testosterone -vaccines -verapamil Talk to your doctor or health care professional before taking any of these medicines: -acetaminophen -aspirin -ibuprofen -ketoprofen -naproxen This list may not describe all possible interactions. Give your health care provider a list of all the medicines, herbs, non-prescription drugs, or dietary supplements you use. Also tell them if you smoke, drink alcohol, or use illegal drugs. Some items may interact with your medicine. What should I watch for while using this medicine? Your condition will be monitored carefully while you are receiving this medicine. You will need important blood work done while you are taking this medicine. This drug may make you feel generally unwell. This is not uncommon, as chemotherapy can affect healthy cells as well as cancer cells. Report any side effects. Continue your course of treatment even though you feel ill unless your doctor tells you to stop. In some cases, you may be given additional medicines to help with side effects. Follow all directions for their use. Call your doctor or health care professional for advice if you get a fever, chills or sore throat, or other symptoms of a cold or flu. Do not treat yourself. This drug decreases your body's ability to fight infections. Try to avoid being around people who are sick. This medicine may increase your risk to bruise or bleed. Call your doctor or health care professional if you notice any unusual bleeding. Be careful brushing and  flossing your teeth or using a toothpick because you may get an infection or bleed more easily. If you have any dental work done, tell your dentist you are receiving this medicine. Avoid taking products that contain aspirin, acetaminophen, ibuprofen, naproxen, or ketoprofen unless instructed by your doctor. These medicines may hide a fever. Do not become pregnant while taking this medicine. Women should inform their doctor if they wish to become pregnant or think they might be pregnant. There is a potential for serious side effects to an unborn child. Talk to your health care professional or pharmacist for more information. Do not breast-feed an infant while taking this medicine. Men are advised not to father a child while receiving this medicine. What side effects may I notice from receiving this medicine? Side effects that you should report to your doctor or health care professional as soon as possible: -allergic reactions like skin rash, itching or hives, swelling of the face, lips, or tongue -low blood counts - This drug may decrease the number of white blood cells, red blood cells and platelets. You may be at increased risk for infections and bleeding. -signs of infection - fever or chills, cough, sore throat, pain or difficulty passing urine -signs of   decreased platelets or bleeding - bruising, pinpoint red spots on the skin, black, tarry stools, nosebleeds -signs of decreased red blood cells - unusually weak or tired, fainting spells, lightheadedness -breathing problems -changes in vision -chest pain -high or low blood pressure -mouth sores -nausea and vomiting -pain, swelling, redness or irritation at the injection site -pain, tingling, numbness in the hands or feet -slow or irregular heartbeat -swelling of the ankle, feet, hands Side effects that usually do not require medical attention (report to your doctor or health care professional if they continue or are bothersome): -aches,  pains -changes in the color of fingernails -diarrhea -hair loss -loss of appetite This list may not describe all possible side effects. Call your doctor for medical advice about side effects. You may report side effects to FDA at 1-800-FDA-1088. Where should I keep my medicine? This drug is given in a hospital or clinic and will not be stored at home. NOTE: This sheet is a summary. It may not cover all possible information. If you have questions about this medicine, talk to your doctor, pharmacist, or health care provider.  2013, [employer]. (12/24/2010 4:13:49 PM) Gemcitabine injection What is this medicine? GEMCITABINE (jem SIT a been) is a chemotherapy drug. This medicine is used to treat many types of cancer like breast cancer, lung cancer, pancreatic cancer, and ovarian cancer. This medicine may be used for other purposes; ask your health care provider or pharmacist if you have questions. What should I tell my health care provider before I take this medicine? They need to know if you have any of these conditions: -blood disorders -infection -kidney disease -liver disease -recent or ongoing radiation therapy -an unusual or allergic reaction to gemcitabine, other chemotherapy, other medicines, foods, dyes, or preservatives -pregnant or trying to get pregnant -breast-feeding How should I use this medicine? This drug is given as an infusion into a vein. It is administered in a hospital or clinic by a specially trained health care professional. Talk to your pediatrician regarding the use of this medicine in children. Special care may be needed. Overdosage: If you think you have taken too much of this medicine contact a poison control center or emergency room at once. NOTE: This medicine is only for you. Do not share this medicine with others. What if I miss a dose? It is important not to miss your dose. Call your doctor or health care professional if you are unable to keep  an appointment. What may interact with this medicine? -medicines to increase blood counts like filgrastim, pegfilgrastim, sargramostim -some other chemotherapy drugs like cisplatin -vaccines Talk to your doctor or health care professional before taking any of these medicines: -acetaminophen -aspirin -ibuprofen -ketoprofen -naproxen This list may not describe all possible interactions. Give your health care provider a list of all the medicines, herbs, non-prescription drugs, or dietary supplements you use. Also tell them if you smoke, drink alcohol, or use illegal drugs. Some items may interact with your medicine. What should I watch for while using this medicine? Visit your doctor for checks on your progress. This drug may make you feel generally unwell. This is not uncommon, as chemotherapy can affect healthy cells as well as cancer cells. Report any side effects. Continue your course of treatment even though you feel ill unless your doctor tells you to stop. In some cases, you may be given additional medicines to help with side effects. Follow all directions for their use. Call your doctor or health care professional for advice   if you get a fever, chills or sore throat, or other symptoms of a cold or flu. Do not treat yourself. This drug decreases your body's ability to fight infections. Try to avoid being around people who are sick. This medicine may increase your risk to bruise or bleed. Call your doctor or health care professional if you notice any unusual bleeding. Be careful brushing and flossing your teeth or using a toothpick because you may get an infection or bleed more easily. If you have any dental work done, tell your dentist you are receiving this medicine. Avoid taking products that contain aspirin, acetaminophen, ibuprofen, naproxen, or ketoprofen unless instructed by your doctor. These medicines may hide a fever. Women should inform their doctor if they wish to become pregnant or  think they might be pregnant. There is a potential for serious side effects to an unborn child. Talk to your health care professional or pharmacist for more information. Do not breast-feed an infant while taking this medicine. What side effects may I notice from receiving this medicine? Side effects that you should report to your doctor or health care professional as soon as possible: -allergic reactions like skin rash, itching or hives, swelling of the face, lips, or tongue -low blood counts - this medicine may decrease the number of white blood cells, red blood cells and platelets. You may be at increased risk for infections and bleeding. -signs of infection - fever or chills, cough, sore throat, pain or difficulty passing urine -signs of decreased platelets or bleeding - bruising, pinpoint red spots on the skin, black, tarry stools, blood in the urine -signs of decreased red blood cells - unusually weak or tired, fainting spells, lightheadedness -breathing problems -chest pain -mouth sores -nausea and vomiting -pain, swelling, redness at site where injected -pain, tingling, numbness in the hands or feet -stomach pain -swelling of ankles, feet, hands -unusual bleeding Side effects that usually do not require medical attention (report to your doctor or health care professional if they continue or are bothersome): -constipation -diarrhea -hair loss -loss of appetite -stomach upset This list may not describe all possible side effects. Call your doctor for medical advice about side effects. You may report side effects to FDA at 1-800-FDA-1088. Where should I keep my medicine? This drug is given in a hospital or clinic and will not be stored at home. NOTE: This sheet is a summary. It may not cover all possible information. If you have questions about this medicine, talk to your doctor, pharmacist, or health care provider.  2012, [employer]. (06/30/2007 6:45:54 PM) 

## 2012-07-16 ENCOUNTER — Ambulatory Visit: Payer: BC Managed Care – PPO | Admitting: Cardiovascular Disease

## 2012-07-17 ENCOUNTER — Other Ambulatory Visit (HOSPITAL_BASED_OUTPATIENT_CLINIC_OR_DEPARTMENT_OTHER): Payer: BC Managed Care – PPO | Admitting: Lab

## 2012-07-17 ENCOUNTER — Ambulatory Visit (HOSPITAL_BASED_OUTPATIENT_CLINIC_OR_DEPARTMENT_OTHER): Payer: BC Managed Care – PPO

## 2012-07-17 ENCOUNTER — Ambulatory Visit (HOSPITAL_BASED_OUTPATIENT_CLINIC_OR_DEPARTMENT_OTHER): Payer: BC Managed Care – PPO | Admitting: Medical

## 2012-07-17 VITALS — BP 128/82 | HR 80 | Temp 98.0°F | Resp 18 | Ht 72.0 in | Wt 214.0 lb

## 2012-07-17 DIAGNOSIS — C259 Malignant neoplasm of pancreas, unspecified: Secondary | ICD-10-CM

## 2012-07-17 DIAGNOSIS — Z5111 Encounter for antineoplastic chemotherapy: Secondary | ICD-10-CM

## 2012-07-17 DIAGNOSIS — R197 Diarrhea, unspecified: Secondary | ICD-10-CM

## 2012-07-17 DIAGNOSIS — D6959 Other secondary thrombocytopenia: Secondary | ICD-10-CM

## 2012-07-17 LAB — CBC WITH DIFFERENTIAL (CANCER CENTER ONLY)
BASO#: 0 10*3/uL (ref 0.0–0.2)
EOS%: 2.2 % (ref 0.0–7.0)
Eosinophils Absolute: 0.1 10*3/uL (ref 0.0–0.5)
HGB: 16.4 g/dL (ref 13.0–17.1)
LYMPH%: 30.6 % (ref 14.0–48.0)
MCH: 33.3 pg (ref 28.0–33.4)
MCHC: 35.5 g/dL (ref 32.0–35.9)
MCV: 94 fL (ref 82–98)
MONO%: 2.9 % (ref 0.0–13.0)
NEUT#: 2 10*3/uL (ref 1.5–6.5)
Platelets: 86 10*3/uL — ABNORMAL LOW (ref 145–400)
RBC: 4.93 10*6/uL (ref 4.20–5.70)

## 2012-07-17 LAB — BASIC METABOLIC PANEL
BUN: 13 mg/dL (ref 6–23)
Calcium: 8.9 mg/dL (ref 8.4–10.5)
Chloride: 105 mEq/L (ref 96–112)
Creatinine, Ser: 0.76 mg/dL (ref 0.50–1.35)

## 2012-07-17 MED ORDER — DEXAMETHASONE SODIUM PHOSPHATE 10 MG/ML IJ SOLN
10.0000 mg | Freq: Once | INTRAMUSCULAR | Status: AC
  Administered 2012-07-17: 10 mg via INTRAVENOUS

## 2012-07-17 MED ORDER — SODIUM CHLORIDE 0.9 % IV SOLN
Freq: Once | INTRAVENOUS | Status: AC
  Administered 2012-07-17: 12:00:00 via INTRAVENOUS

## 2012-07-17 MED ORDER — GEMCITABINE HCL CHEMO INJECTION 1 GM/26.3ML
1000.0000 mg/m2 | Freq: Once | INTRAVENOUS | Status: AC
  Administered 2012-07-17: 2242 mg via INTRAVENOUS
  Filled 2012-07-17: qty 59

## 2012-07-17 MED ORDER — PACLITAXEL PROTEIN-BOUND CHEMO INJECTION 100 MG
125.0000 mg/m2 | Freq: Once | INTRAVENOUS | Status: AC
  Administered 2012-07-17: 275 mg via INTRAVENOUS
  Filled 2012-07-17: qty 55

## 2012-07-17 MED ORDER — ONDANSETRON 8 MG/50ML IVPB (CHCC)
8.0000 mg | Freq: Once | INTRAVENOUS | Status: AC
  Administered 2012-07-17: 8 mg via INTRAVENOUS

## 2012-07-17 NOTE — Patient Instructions (Addendum)
[facility] Discharge Instructions for Patients Receiving Chemotherapy  Today you received the following chemotherapy agents Abraxane, Gemcitabine  To help prevent nausea and vomiting after your treatment, we encourage you to take your nausea medication as prescribed   Take home nausea medications:  1) Zofran (Ondansetron ) 8 mg by mouth.  Take one in the am and one in the pm.  Begin this the day after chemotherapy and take this for 4 days.  2) Decadron (Dexamethaasone) 4 mg by mouth.  Take one in the am and one in the pm with the Zofran.  Begin this the day after chemotherapy and take this for 4 days.    3) Prochlorperazine (Compazine ) 10 mg by mouth.  Take this AS NEEDED for nausea every 8 hours.  4) Ativan (Lorazepam) 1 mg.  Take this AS NEEDED for nausea every 8 hours for nausea, anxiety, sleep  If you develop nausea and vomiting that is not controlled by your nausea medication, call the clinic. If it is after clinic hours your family physician or the after hours number for the clinic or go to the Emergency Department.   BELOW ARE SYMPTOMS THAT SHOULD BE REPORTED IMMEDIATELY:  *FEVER GREATER THAN 100.5 F  *CHILLS WITH OR WITHOUT FEVER  NAUSEA AND VOMITING THAT IS NOT CONTROLLED WITH YOUR NAUSEA MEDICATION  *UNUSUAL SHORTNESS OF BREATH  *UNUSUAL BRUISING OR BLEEDING  TENDERNESS IN MOUTH AND THROAT WITH OR WITHOUT PRESENCE OF ULCERS  *URINARY PROBLEMS  *BOWEL PROBLEMS  UNUSUAL RASH Items with * indicate a potential emergency and should be followed up as soon as possible.  One of the nurses will contact you 24 hours after your treatment. Please let the nurse know about any problems that you may have experienced. Feel free to call the clinic you have any questions or concerns. The clinic phone number is [phone].   I have been informed and understand all the instructions given to me. I know to contact the clinic, my physician, or go to the  Emergency Department if any problems should occur. I do not have any questions at this time, but understand that I may call the clinic during office hours   should I have any questions or need assistance in obtaining follow up care.    __________________________________________  _____________  __________ Signature of Patient or Authorized Representative            Date                   Time    __________________________________________ Nurse's Signature          Nanoparticle Albumin-Bound Paclitaxel injection What is this medicine? NANOPARTICLE ALBUMIN-BOUND PACLITAXEL (Na no PAHR ti kuhl al BYOO muhn-bound PAK li TAX el) is a chemotherapy drug. It targets fast dividing cells, like cancer cells, and causes these cells to die. This medicine is used to treat advanced breast cancer and advanced lung cancer. This medicine may be used for other purposes; ask your health care provider or pharmacist if you have questions. What should I tell my health care provider before I take this medicine? They need to know if you have any of these conditions: -kidney disease -liver disease -low blood counts, like low platelets, red blood cells, or white blood cells -recent or ongoing radiation therapy -an unusual or allergic reaction to paclitaxel, albumin, other chemotherapy, other medicines, foods, dyes, or preservatives -pregnant or trying to get pregnant -breast-feeding How should I use this medicine?  This drug is given as an infusion into a vein. It is administered in a hospital or clinic by a specially trained health care professional. Talk to your pediatrician regarding the use of this medicine in children. Special care may be needed. Overdosage: If you think you have taken too much of this medicine contact a poison control center or emergency room at once. NOTE: This medicine is only for you. Do not share this medicine with others. What if I miss a dose? It is important not to miss your  dose. Call your doctor or health care professional if you are unable to keep an appointment. What may interact with this medicine? This medicine may also interact with the following medications: -cyclosporine -dexamethasone -diazepam -ketoconazole -medicines to increase blood counts like filgrastim, pegfilgrastim, sargramostim -other chemotherapy drugs like cisplatin, doxorubicin, epirubicin, etoposide, teniposide, vincristine -quinidine -testosterone -vaccines -verapamil Talk to your doctor or health care professional before taking any of these medicines: -acetaminophen -aspirin -ibuprofen -ketoprofen -naproxen This list may not describe all possible interactions. Give your health care provider a list of all the medicines, herbs, non-prescription drugs, or dietary supplements you use. Also tell them if you smoke, drink alcohol, or use illegal drugs. Some items may interact with your medicine. What should I watch for while using this medicine? Your condition will be monitored carefully while you are receiving this medicine. You will need important blood work done while you are taking this medicine. This drug may make you feel generally unwell. This is not uncommon, as chemotherapy can affect healthy cells as well as cancer cells. Report any side effects. Continue your course of treatment even though you feel ill unless your doctor tells you to stop. In some cases, you may be given additional medicines to help with side effects. Follow all directions for their use. Call your doctor or health care professional for advice if you get a fever, chills or sore throat, or other symptoms of a cold or flu. Do not treat yourself. This drug decreases your body's ability to fight infections. Try to avoid being around people who are sick. This medicine may increase your risk to bruise or bleed. Call your doctor or health care professional if you notice any unusual bleeding. Be careful brushing and  flossing your teeth or using a toothpick because you may get an infection or bleed more easily. If you have any dental work done, tell your dentist you are receiving this medicine. Avoid taking products that contain aspirin, acetaminophen, ibuprofen, naproxen, or ketoprofen unless instructed by your doctor. These medicines may hide a fever. Do not become pregnant while taking this medicine. Women should inform their doctor if they wish to become pregnant or think they might be pregnant. There is a potential for serious side effects to an unborn child. Talk to your health care professional or pharmacist for more information. Do not breast-feed an infant while taking this medicine. Men are advised not to father a child while receiving this medicine. What side effects may I notice from receiving this medicine? Side effects that you should report to your doctor or health care professional as soon as possible: -allergic reactions like skin rash, itching or hives, swelling of the face, lips, or tongue -low blood counts - This drug may decrease the number of white blood cells, red blood cells and platelets. You may be at increased risk for infections and bleeding. -signs of infection - fever or chills, cough, sore throat, pain or difficulty passing urine -signs of  decreased platelets or bleeding - bruising, pinpoint red spots on the skin, black, tarry stools, nosebleeds -signs of decreased red blood cells - unusually weak or tired, fainting spells, lightheadedness -breathing problems -changes in vision -chest pain -high or low blood pressure -mouth sores -nausea and vomiting -pain, swelling, redness or irritation at the injection site -pain, tingling, numbness in the hands or feet -slow or irregular heartbeat -swelling of the ankle, feet, hands Side effects that usually do not require medical attention (report to your doctor or health care professional if they continue or are bothersome): -aches,  pains -changes in the color of fingernails -diarrhea -hair loss -loss of appetite This list may not describe all possible side effects. Call your doctor for medical advice about side effects. You may report side effects to FDA at 1-800-FDA-1088. Where should I keep my medicine? This drug is given in a hospital or clinic and will not be stored at home. NOTE: This sheet is a summary. It may not cover all possible information. If you have questions about this medicine, talk to your doctor, pharmacist, or health care provider.  2013, [employer] Standard. (12/24/2010 4:13:49 PM) Gemcitabine injection What is this medicine? GEMCITABINE (jem SIT a been) is a chemotherapy drug. This medicine is used to treat many types of cancer like breast cancer, lung cancer, pancreatic cancer, and ovarian cancer. This medicine may be used for other purposes; ask your health care provider or pharmacist if you have questions. What should I tell my health care provider before I take this medicine? They need to know if you have any of these conditions: -blood disorders -infection -kidney disease -liver disease -recent or ongoing radiation therapy -an unusual or allergic reaction to gemcitabine, other chemotherapy, other medicines, foods, dyes, or preservatives -pregnant or trying to get pregnant -breast-feeding How should I use this medicine? This drug is given as an infusion into a vein. It is administered in a hospital or clinic by a specially trained health care professional. Talk to your pediatrician regarding the use of this medicine in children. Special care may be needed. Overdosage: If you think you have taken too much of this medicine contact a poison control center or emergency room at once. NOTE: This medicine is only for you. Do not share this medicine with others. What if I miss a dose? It is important not to miss your dose. Call your doctor or health care professional if you are unable to keep  an appointment. What may interact with this medicine? -medicines to increase blood counts like filgrastim, pegfilgrastim, sargramostim -some other chemotherapy drugs like cisplatin -vaccines Talk to your doctor or health care professional before taking any of these medicines: -acetaminophen -aspirin -ibuprofen -ketoprofen -naproxen This list may not describe all possible interactions. Give your health care provider a list of all the medicines, herbs, non-prescription drugs, or dietary supplements you use. Also tell them if you smoke, drink alcohol, or use illegal drugs. Some items may interact with your medicine. What should I watch for while using this medicine? Visit your doctor for checks on your progress. This drug may make you feel generally unwell. This is not uncommon, as chemotherapy can affect healthy cells as well as cancer cells. Report any side effects. Continue your course of treatment even though you feel ill unless your doctor tells you to stop. In some cases, you may be given additional medicines to help with side effects. Follow all directions for their use. Call your doctor or health care professional for advice  if you get a fever, chills or sore throat, or other symptoms of a cold or flu. Do not treat yourself. This drug decreases your body's ability to fight infections. Try to avoid being around people who are sick. This medicine may increase your risk to bruise or bleed. Call your doctor or health care professional if you notice any unusual bleeding. Be careful brushing and flossing your teeth or using a toothpick because you may get an infection or bleed more easily. If you have any dental work done, tell your dentist you are receiving this medicine. Avoid taking products that contain aspirin, acetaminophen, ibuprofen, naproxen, or ketoprofen unless instructed by your doctor. These medicines may hide a fever. Women should inform their doctor if they wish to become pregnant or  think they might be pregnant. There is a potential for serious side effects to an unborn child. Talk to your health care professional or pharmacist for more information. Do not breast-feed an infant while taking this medicine. What side effects may I notice from receiving this medicine? Side effects that you should report to your doctor or health care professional as soon as possible: -allergic reactions like skin rash, itching or hives, swelling of the face, lips, or tongue -low blood counts - this medicine may decrease the number of white blood cells, red blood cells and platelets. You may be at increased risk for infections and bleeding. -signs of infection - fever or chills, cough, sore throat, pain or difficulty passing urine -signs of decreased platelets or bleeding - bruising, pinpoint red spots on the skin, black, tarry stools, blood in the urine -signs of decreased red blood cells - unusually weak or tired, fainting spells, lightheadedness -breathing problems -chest pain -mouth sores -nausea and vomiting -pain, swelling, redness at site where injected -pain, tingling, numbness in the hands or feet -stomach pain -swelling of ankles, feet, hands -unusual bleeding Side effects that usually do not require medical attention (report to your doctor or health care professional if they continue or are bothersome): -constipation -diarrhea -hair loss -loss of appetite -stomach upset This list may not describe all possible side effects. Call your doctor for medical advice about side effects. You may report side effects to FDA at 1-800-FDA-1088. Where should I keep my medicine? This drug is given in a hospital or clinic and will not be stored at home. NOTE: This sheet is a summary. It may not cover all possible information. If you have questions about this medicine, talk to your doctor, pharmacist, or health care provider.  2012, [employer] Standard. (06/30/2007 6:45:54 PM)

## 2012-07-17 NOTE — Progress Notes (Signed)
DIAGNOSIS:  Metastatic adenocarcinoma of the pancreas.  CURRENT THERAPY:  S/p cycle 1 day 1 of Abraxane/Gemzar  INTERIM HISTORY: [name] presents today for an office followup visit.  His fianc accompanies him. Overall he reports that he is doing quite well.  He has no complaints.  He is status post his cycle 1 day 1 of Abraxane/Gemzar.  He does have diarrhea, however this has been chronic.  He is on pancreatic enzymes.  He is also on Align. He stays quite busy.  He does not report any excessive fatigue or weakness.  He is not reporting any abdominal pain.  He does not report any nausea or vomiting.  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  In reviewing his blood work his platelets are low at 86,000.  He's not reporting any nosebleeds bleed, melena or hematochezia.  He's not reporting any rectal bleeding.  He has lost about 9 pounds his last visit.  He reports that he is extremely active and sometimes he forgoes eating meals.  I did advise him use supplemental drinks such as boost or ensure.  He reports he has a great appetite he denies any fevers, chills, night sweats. He denies any generalized swelling, any chest pain, shortness of breath or cough. He denies any headaches or rashes or visual changes.  Overall his ECOG performance status is 0.  Review of Systems: Constitutional:Negative for malaise/fatigue, fever, chills, weight loss, diaphoresis, activity change, appetite change, and unexpected weight change.  HEENT: Negative for double vision, blurred vision, visual loss, ear pain, tinnitus, congestion, rhinorrhea, epistaxis sore throat or sinus disease, oral pain/lesion, tongue soreness Respiratory: Negative for cough, chest tightness, shortness of breath, wheezing and stridor.  Cardiovascular: Negative for chest pain, palpitations, leg swelling, orthopnea, PND, DOE or claudication Gastrointestinal: Negative for nausea, vomiting, abdominal pain, diarrhea, constipation, blood in stool,  melena, hematochezia, abdominal distention, anal bleeding, rectal pain, anorexia and hematemesis.  Genitourinary: Negative for dysuria, frequency, hematuria,  Musculoskeletal: Negative for myalgias, back pain, joint swelling, arthralgias and gait problem.  Skin: Negative for rash, color change, pallor and wound.  Neurological:. Negative for dizziness/light-headedness, tremors, seizures, syncope, facial asymmetry, speech difficulty, weakness, numbness, headaches and paresthesias.  Hematological: Negative for adenopathy. Does not bruise/bleed easily.  Psychiatric/Behavioral:  Negative for depression, no loss of interest in normal activity or change in sleep pattern.   Physical Exam: This is a 57 year old well-developed well-nourished white gentleman in no obvious distress Vitals: temperature 98.2 degrees pulse 80 respirations 18 blood pressure 128/82 weight 214 pounds  HEENT reveals a normocephalic, atraumatic skull, no scleral icterus, no oral lesions  Neck is supple without any cervical or supraclavicular adenopathy.  Lungs are clear to auscultation bilaterally. There are no wheezes, rales or rhonci Cardiac is regular rate and rhythm with a normal S1 and S2. There are no murmurs, rubs, or bruits.  Abdomen is soft with good bowel sounds, there is no palpable mass. There is no palpable hepatosplenomegaly. There is no palpable fluid wave.  Musculoskeletal no tenderness of the spine, ribs, or hips.  Extremities there are no clubbing, cyanosis, or edema.  Skin no petechia, purpura or ecchymosis Neurologic is nonfocal.  Laboratory Data: White count 3.1 hemoglobin 16.4 hematocrit 46.2 platelets 86,000 ANC 2.0  Current Outpatient Prescriptions on File Prior to Visit  Medication Sig Dispense Refill  . ASTAXANTHIN PO Take 2 capsules by mouth daily.       . bifidobacterium infantis (ALIGN) capsule Take 1 capsule by mouth daily.      Marland Kitchen  Cream Base (PCCA LIPODERM BASE) CREA Apply 10 mg topically daily.  10 mg daily rubbed into hairless area on body, hips      . dexamethasone (DECADRON) 4 MG tablet Take 1 tablet (4 mg total) by mouth 2 (two) times daily with a meal. Begin taking this one day after chemotherapy and take for 4 days for nausea  20 tablet  3  . dicyclomine (BENTYL) 10 MG capsule Take 10 mg by mouth 3 (three) times daily as needed (bladder spasm).      Marland Kitchen diltiazem (DILACOR XR) 240 MG 24 hr capsule Take 1 capsule (240 mg total) by mouth every morning.  30 capsule  3  . fish oil-omega-3 fatty acids 1000 MG capsule Take 2 g by mouth daily.      Marland Kitchen Hyaluronic Acid-Vitamin C (HYALURONIC ACID PO) Take 2 tablets by mouth daily.      Marland Kitchen LORazepam (ATIVAN) 1 MG tablet Take 1 tablet (1 mg total) by mouth every 8 (eight) hours. A needed for nausea or anxiety  30 tablet  2  . Multiple Vitamin (MULTIVITAMIN WITH MINERALS) TABS Take 1 tablet by mouth daily.      . ondansetron (ZOFRAN) 8 MG tablet Take 1 tablet (8 mg total) by mouth 2 (two) times daily. Begin taking day after chemotherapy and take for 4 days. Take 1 tablet in the am and one tablet in the pm.  20 tablet  3  . Pancrelipase, Lip-Prot-Amyl, 24000 UNITS CPEP Take 1 capsule by mouth 3 (three) times daily with meals. Take 1 prior to each meal      . prochlorperazine (COMPAZINE) 10 MG tablet Take 1 tablet (10 mg total) by mouth every 6 (six) hours as needed. For nausea  30 tablet  3  . tadalafil (CIALIS) 20 MG tablet Take 20 mg by mouth daily as needed.       No current facility-administered medications on file prior to visit.   Assessment/Plan: This is a pleasant 57 year old white gentleman with the following issues:  #1.  Metastatic pancreatic cancer.  He is currently in his first cycle of Abraxane/Gemzar.  He is tolerating it well without any major toxicities.  He will go ahead and proceed with his cycle 1 day 8 today.  #2.  Thrombocytopenia.  This is secondary to chemotherapy.  He is not actively bleeding.  He is asymptomatic.  We will  continue to monitor this closely.  #3.  Diarrhea.  This is secondary to #1.  He is on pancreatic enzymes and Align.   #4.  Followup.  We will follow back up with Mr. Keller in 3 weeks but before then should there be questions or concerns.

## 2012-07-21 ENCOUNTER — Telehealth: Payer: Self-pay | Admitting: Hematology & Oncology

## 2012-07-21 NOTE — Telephone Encounter (Signed)
Faxed [employer] PATIENT REPORT application for his ABRAYANE today to: [phone].   [employer] ph: [phone]

## 2012-07-22 ENCOUNTER — Telehealth: Payer: Self-pay | Admitting: Hematology & Oncology

## 2012-07-22 NOTE — Telephone Encounter (Signed)
Debby Royal Hawthorn w [employer] called and said,  pt has met his oop through his insurance and they will pay at 100% now. But, she is going to call back in 6 weeks to make sure he has no self pay bal.  If he does, she said she will proceed w the next step for his approval for the Abrayane and they will pay as a secondary provider.  Debby contact nbr: [phone]  ABRAYANE [id number]

## 2012-07-24 ENCOUNTER — Ambulatory Visit: Payer: BC Managed Care – PPO

## 2012-07-24 ENCOUNTER — Other Ambulatory Visit (HOSPITAL_BASED_OUTPATIENT_CLINIC_OR_DEPARTMENT_OTHER): Payer: BC Managed Care – PPO | Admitting: Lab

## 2012-07-24 DIAGNOSIS — C250 Malignant neoplasm of head of pancreas: Secondary | ICD-10-CM

## 2012-07-24 DIAGNOSIS — C259 Malignant neoplasm of pancreas, unspecified: Secondary | ICD-10-CM

## 2012-07-24 LAB — CBC WITH DIFFERENTIAL (CANCER CENTER ONLY)
BASO#: 0 10*3/uL (ref 0.0–0.2)
EOS%: 1.8 % (ref 0.0–7.0)
Eosinophils Absolute: 0.1 10*3/uL (ref 0.0–0.5)
HGB: 15.3 g/dL (ref 13.0–17.1)
LYMPH%: 32.7 % (ref 14.0–48.0)
MCH: 33 pg (ref 28.0–33.4)
MCHC: 35.7 g/dL (ref 32.0–35.9)
MCV: 93 fL (ref 82–98)
MONO%: 3.6 % (ref 0.0–13.0)
Platelets: 36 10*3/uL — ABNORMAL LOW (ref 145–400)
RBC: 4.63 10*6/uL (ref 4.20–5.70)

## 2012-07-24 NOTE — Progress Notes (Signed)
Discussed labs with Dr. [name], will hold chemo treatment today. Discussed with patient and girlfriend, verbalized understanding.

## 2012-08-07 ENCOUNTER — Other Ambulatory Visit (HOSPITAL_BASED_OUTPATIENT_CLINIC_OR_DEPARTMENT_OTHER): Payer: BC Managed Care – PPO | Admitting: Lab

## 2012-08-07 ENCOUNTER — Ambulatory Visit (HOSPITAL_BASED_OUTPATIENT_CLINIC_OR_DEPARTMENT_OTHER): Payer: BC Managed Care – PPO | Admitting: Hematology & Oncology

## 2012-08-07 ENCOUNTER — Ambulatory Visit (HOSPITAL_BASED_OUTPATIENT_CLINIC_OR_DEPARTMENT_OTHER): Payer: BC Managed Care – PPO

## 2012-08-07 VITALS — BP 123/80 | HR 90 | Temp 98.5°F | Resp 18 | Ht 73.0 in | Wt 210.0 lb

## 2012-08-07 DIAGNOSIS — C778 Secondary and unspecified malignant neoplasm of lymph nodes of multiple regions: Secondary | ICD-10-CM

## 2012-08-07 DIAGNOSIS — Z5111 Encounter for antineoplastic chemotherapy: Secondary | ICD-10-CM

## 2012-08-07 DIAGNOSIS — C250 Malignant neoplasm of head of pancreas: Secondary | ICD-10-CM

## 2012-08-07 DIAGNOSIS — C259 Malignant neoplasm of pancreas, unspecified: Secondary | ICD-10-CM

## 2012-08-07 DIAGNOSIS — R197 Diarrhea, unspecified: Secondary | ICD-10-CM

## 2012-08-07 LAB — CMP (CANCER CENTER ONLY)
ALT(SGPT): 39 U/L (ref 10–47)
Albumin: 3.7 g/dL (ref 3.3–5.5)
CO2: 23 mEq/L (ref 18–33)
Chloride: 108 mEq/L (ref 98–108)
Glucose, Bld: 101 mg/dL (ref 73–118)
Potassium: 3.6 mEq/L (ref 3.3–4.7)
Sodium: 138 mEq/L (ref 128–145)
Total Bilirubin: 0.6 mg/dl (ref 0.20–1.60)
Total Protein: 7.1 g/dL (ref 6.4–8.1)

## 2012-08-07 LAB — CBC WITH DIFFERENTIAL (CANCER CENTER ONLY)
BASO%: 0.5 % (ref 0.0–2.0)
EOS%: 1.4 % (ref 0.0–7.0)
HCT: 42.9 % (ref 38.7–49.9)
LYMPH%: 16.4 % (ref 14.0–48.0)
MCH: 33.1 pg (ref 28.0–33.4)
MCHC: 35 g/dL (ref 32.0–35.9)
MCV: 95 fL (ref 82–98)
MONO%: 13.8 % — ABNORMAL HIGH (ref 0.0–13.0)
NEUT%: 67.9 % (ref 40.0–80.0)
Platelets: 274 10*3/uL (ref 145–400)
RDW: 14.9 % (ref 11.1–15.7)

## 2012-08-07 MED ORDER — SODIUM CHLORIDE 0.9 % IV SOLN
900.0000 mg/m2 | Freq: Once | INTRAVENOUS | Status: AC
  Administered 2012-08-07: 2052 mg via INTRAVENOUS
  Filled 2012-08-07: qty 54

## 2012-08-07 MED ORDER — PACLITAXEL PROTEIN-BOUND CHEMO INJECTION 100 MG
125.0000 mg/m2 | Freq: Once | INTRAVENOUS | Status: AC
  Administered 2012-08-07: 275 mg via INTRAVENOUS
  Filled 2012-08-07: qty 55

## 2012-08-07 MED ORDER — SODIUM CHLORIDE 0.9 % IV SOLN
Freq: Once | INTRAVENOUS | Status: AC
  Administered 2012-08-07: 13:00:00 via INTRAVENOUS

## 2012-08-07 MED ORDER — DEXAMETHASONE SODIUM PHOSPHATE 10 MG/ML IJ SOLN
10.0000 mg | Freq: Once | INTRAMUSCULAR | Status: AC
  Administered 2012-08-07: 10 mg via INTRAVENOUS

## 2012-08-07 MED ORDER — ONDANSETRON 8 MG/50ML IVPB (CHCC)
8.0000 mg | Freq: Once | INTRAVENOUS | Status: AC
  Administered 2012-08-07: 8 mg via INTRAVENOUS

## 2012-08-07 NOTE — Progress Notes (Signed)
This office note has been dictated.

## 2012-08-07 NOTE — Patient Instructions (Addendum)
Nanoparticle Albumin-Bound Paclitaxel injection What is this medicine? NANOPARTICLE ALBUMIN-BOUND PACLITAXEL (Na no PAHR ti kuhl al BYOO muhn-bound PAK li TAX el) is a chemotherapy drug. It targets fast dividing cells, like cancer cells, and causes these cells to die. This medicine is used to treat advanced breast cancer and advanced lung cancer. This medicine may be used for other purposes; ask your health care provider or pharmacist if you have questions. What should I tell my health care provider before I take this medicine? They need to know if you have any of these conditions: -kidney disease -liver disease -low blood counts, like low platelets, red blood cells, or white blood cells -recent or ongoing radiation therapy -an unusual or allergic reaction to paclitaxel, albumin, other chemotherapy, other medicines, foods, dyes, or preservatives -pregnant or trying to get pregnant -breast-feeding How should I use this medicine? This drug is given as an infusion into a vein. It is administered in a hospital or clinic by a specially trained health care professional. Talk to your pediatrician regarding the use of this medicine in children. Special care may be needed. Overdosage: If you think you have taken too much of this medicine contact a poison control center or emergency room at once. NOTE: This medicine is only for you. Do not share this medicine with others. What if I miss a dose? It is important not to miss your dose. Call your doctor or health care professional if you are unable to keep an appointment. What may interact with this medicine? This medicine may also interact with the following medications: -cyclosporine -dexamethasone -diazepam -ketoconazole -medicines to increase blood counts like filgrastim, pegfilgrastim, sargramostim -other chemotherapy drugs like cisplatin, doxorubicin, epirubicin, etoposide, teniposide,  vincristine -quinidine -testosterone -vaccines -verapamil Talk to your doctor or health care professional before taking any of these medicines: -acetaminophen -aspirin -ibuprofen -ketoprofen -naproxen This list may not describe all possible interactions. Give your health care provider a list of all the medicines, herbs, non-prescription drugs, or dietary supplements you use. Also tell them if you smoke, drink alcohol, or use illegal drugs. Some items may interact with your medicine. What should I watch for while using this medicine? Your condition will be monitored carefully while you are receiving this medicine. You will need important blood work done while you are taking this medicine. This drug may make you feel generally unwell. This is not uncommon, as chemotherapy can affect healthy cells as well as cancer cells. Report any side effects. Continue your course of treatment even though you feel ill unless your doctor tells you to stop. In some cases, you may be given additional medicines to help with side effects. Follow all directions for their use. Call your doctor or health care professional for advice if you get a fever, chills or sore throat, or other symptoms of a cold or flu. Do not treat yourself. This drug decreases your body's ability to fight infections. Try to avoid being around people who are sick. This medicine may increase your risk to bruise or bleed. Call your doctor or health care professional if you notice any unusual bleeding. Be careful brushing and flossing your teeth or using a toothpick because you may get an infection or bleed more easily. If you have any dental work done, tell your dentist you are receiving this medicine. Avoid taking products that contain aspirin, acetaminophen, ibuprofen, naproxen, or ketoprofen unless instructed by your doctor. These medicines may hide a fever. Do not become pregnant while taking this medicine. Women should  inform their doctor if  they wish to become pregnant or think they might be pregnant. There is a potential for serious side effects to an unborn child. Talk to your health care professional or pharmacist for more information. Do not breast-feed an infant while taking this medicine. Men are advised not to father a child while receiving this medicine. What side effects may I notice from receiving this medicine? Side effects that you should report to your doctor or health care professional as soon as possible: -allergic reactions like skin rash, itching or hives, swelling of the face, lips, or tongue -low blood counts - This drug may decrease the number of white blood cells, red blood cells and platelets. You may be at increased risk for infections and bleeding. -signs of infection - fever or chills, cough, sore throat, pain or difficulty passing urine -signs of decreased platelets or bleeding - bruising, pinpoint red spots on the skin, black, tarry stools, nosebleeds -signs of decreased red blood cells - unusually weak or tired, fainting spells, lightheadedness -breathing problems -changes in vision -chest pain -high or low blood pressure -mouth sores -nausea and vomiting -pain, swelling, redness or irritation at the injection site -pain, tingling, numbness in the hands or feet -slow or irregular heartbeat -swelling of the ankle, feet, hands Side effects that usually do not require medical attention (report to your doctor or health care professional if they continue or are bothersome): -aches, pains -changes in the color of fingernails -diarrhea -hair loss -loss of appetite This list may not describe all possible side effects. Call your doctor for medical advice about side effects. You may report side effects to FDA at 1-800-FDA-1088. Where should I keep my medicine? This drug is given in a hospital or clinic and will not be stored at home. NOTE: This sheet is a summary. It may not cover all possible information.  If you have questions about this medicine, talk to your doctor, pharmacist, or health care provider.  2013, [employer]. (12/24/2010 4:13:49 PM)  Carboplatin injection What is this medicine? CARBOPLATIN (KAR boe pla tin) is a chemotherapy drug. It targets fast dividing cells, like cancer cells, and causes these cells to die. This medicine is used to treat ovarian cancer and many other cancers. This medicine may be used for other purposes; ask your health care provider or pharmacist if you have questions. What should I tell my health care provider before I take this medicine? They need to know if you have any of these conditions: -blood disorders -hearing problems -kidney disease -recent or ongoing radiation therapy -an unusual or allergic reaction to carboplatin, cisplatin, other chemotherapy, other medicines, foods, dyes, or preservatives -pregnant or trying to get pregnant -breast-feeding How should I use this medicine? This drug is usually given as an infusion into a vein. It is administered in a hospital or clinic by a specially trained health care professional. Talk to your pediatrician regarding the use of this medicine in children. Special care may be needed. Overdosage: If you think you have taken too much of this medicine contact a poison control center or emergency room at once. NOTE: This medicine is only for you. Do not share this medicine with others. What if I miss a dose? It is important not to miss a dose. Call your doctor or health care professional if you are unable to keep an appointment. What may interact with this medicine? -medicines for seizures -medicines to increase blood counts like filgrastim, pegfilgrastim, sargramostim -some antibiotics like amikacin,  gentamicin, neomycin, streptomycin, tobramycin -vaccines Talk to your doctor or health care professional before taking any of these  medicines: -acetaminophen -aspirin -ibuprofen -ketoprofen -naproxen This list may not describe all possible interactions. Give your health care provider a list of all the medicines, herbs, non-prescription drugs, or dietary supplements you use. Also tell them if you smoke, drink alcohol, or use illegal drugs. Some items may interact with your medicine. What should I watch for while using this medicine? Your condition will be monitored carefully while you are receiving this medicine. You will need important blood work done while you are taking this medicine. This drug may make you feel generally unwell. This is not uncommon, as chemotherapy can affect healthy cells as well as cancer cells. Report any side effects. Continue your course of treatment even though you feel ill unless your doctor tells you to stop. In some cases, you may be given additional medicines to help with side effects. Follow all directions for their use. Call your doctor or health care professional for advice if you get a fever, chills or sore throat, or other symptoms of a cold or flu. Do not treat yourself. This drug decreases your body's ability to fight infections. Try to avoid being around people who are sick. This medicine may increase your risk to bruise or bleed. Call your doctor or health care professional if you notice any unusual bleeding. Be careful brushing and flossing your teeth or using a toothpick because you may get an infection or bleed more easily. If you have any dental work done, tell your dentist you are receiving this medicine. Avoid taking products that contain aspirin, acetaminophen, ibuprofen, naproxen, or ketoprofen unless instructed by your doctor. These medicines may hide a fever. Do not become pregnant while taking this medicine. Women should inform their doctor if they wish to become pregnant or think they might be pregnant. There is a potential for serious side effects to an unborn child. Talk to  your health care professional or pharmacist for more information. Do not breast-feed an infant while taking this medicine. What side effects may I notice from receiving this medicine? Side effects that you should report to your doctor or health care professional as soon as possible: -allergic reactions like skin rash, itching or hives, swelling of the face, lips, or tongue -signs of infection - fever or chills, cough, sore throat, pain or difficulty passing urine -signs of decreased platelets or bleeding - bruising, pinpoint red spots on the skin, black, tarry stools, nosebleeds -signs of decreased red blood cells - unusually weak or tired, fainting spells, lightheadedness -breathing problems -changes in hearing -changes in vision -chest pain -high blood pressure -low blood counts - This drug may decrease the number of white blood cells, red blood cells and platelets. You may be at increased risk for infections and bleeding. -nausea and vomiting -pain, swelling, redness or irritation at the injection site -pain, tingling, numbness in the hands or feet -problems with balance, talking, walking -trouble passing urine or change in the amount of urine Side effects that usually do not require medical attention (report to your doctor or health care professional if they continue or are bothersome): -hair loss -loss of appetite -metallic taste in the mouth or changes in taste This list may not describe all possible side effects. Call your doctor for medical advice about side effects. You may report side effects to FDA at 1-800-FDA-1088. Where should I keep my medicine? This drug is given in a hospital or  clinic and will not be stored at home. NOTE: This sheet is a summary. It may not cover all possible information. If you have questions about this medicine, talk to your doctor, pharmacist, or health care provider.  2012, [employer]. (05/26/2007 2:38:05 PM)

## 2012-08-08 LAB — LACTATE DEHYDROGENASE: LDH: 202 U/L (ref 94–250)

## 2012-08-10 NOTE — Progress Notes (Signed)
CC:   Austin Escobar. Austin Pais, DO  DIAGNOSIS:  Metastatic adenocarcinoma of the pancreas.  CURRENT THERAPY:  The patient is status post cycle 1 Abraxane/gemcitabine.  INTERIM HISTORY:  Austin Escobar comes in for his followup.  He looks incredibly fit.  He looks really good.  He is feeling well.  The chemotherapy really did not have much effect on him.  I think we probably cut back 1 week of his chemo because of thrombocytopenia.  He has not had any problems with nausea or vomiting.  He is on Creon for his diarrhea.  He feels that he is not having as much diarrhea.  He feels that he is absorbing food better.  He has had no cough or shortness of breath.  He has had no leg swelling. He has had no rashes.  Overall, his performance status is ECOG 0.  PHYSICAL EXAMINATION:  General:  This is a well-developed, well- nourished white gentleman in no obvious distress.  Vital signs: Temperature of 98.5, pulse 90, respiratory rate 18, blood pressure 123/80.  Weight is 210 pounds.  Head and neck:  Normocephalic, atraumatic skull.  There are no ocular or oral lesions.  There are no palpable cervical or supraclavicular lymph nodes.  Lungs:  Clear bilaterally.  Cardiac:  Regular rate and rhythm with a normal S1 and S2. There are no murmurs, rubs or bruits.  Abdomen:  Soft with good bowel sounds.  There is no palpable abdominal mass.  There is no fluid wave. There is no palpable hepatosplenomegaly.  Extremities:  No clubbing, cyanosis or edema.  He has good range motion of his joints.  Skin:  No rashes, ecchymoses or petechia.  LABORATORY STUDIES:  White cell count is 7.3, hemoglobin 15, hematocrit 43, platelet count 274.  IMPRESSION:  Austin Escobar is a very nice 58 year old gentleman with metastatic pancreatic cancer.  He has pulmonary mets.  His initial CA-19- 9 was 3876.  We will go ahead and plan for a second cycle of chemotherapy to start now.  I have adjusted the dose of gemcitabine a little bit  to try to help minimize the thrombocytopenia.  We will see what his CA19-9 is.  I will go ahead and plan for a followup PET scan after this cycle, so we can see how well things have responded.  Hopefully, by his appearance, he is responding.    ______________________________ Josph Macho, M.D. PRE/MEDQ  D:  08/07/2012  T:  08/08/2012  Job:  [id number]

## 2012-08-14 ENCOUNTER — Ambulatory Visit (HOSPITAL_BASED_OUTPATIENT_CLINIC_OR_DEPARTMENT_OTHER): Payer: BC Managed Care – PPO

## 2012-08-14 ENCOUNTER — Other Ambulatory Visit (HOSPITAL_BASED_OUTPATIENT_CLINIC_OR_DEPARTMENT_OTHER): Payer: BC Managed Care – PPO | Admitting: Lab

## 2012-08-14 VITALS — BP 138/82 | HR 81 | Temp 97.3°F | Resp 20 | Ht 73.0 in | Wt 208.4 lb

## 2012-08-14 DIAGNOSIS — C259 Malignant neoplasm of pancreas, unspecified: Secondary | ICD-10-CM

## 2012-08-14 DIAGNOSIS — C250 Malignant neoplasm of head of pancreas: Secondary | ICD-10-CM

## 2012-08-14 DIAGNOSIS — Z5111 Encounter for antineoplastic chemotherapy: Secondary | ICD-10-CM

## 2012-08-14 LAB — CBC WITH DIFFERENTIAL (CANCER CENTER ONLY)
BASO#: 0 10*3/uL (ref 0.0–0.2)
EOS%: 1.1 % (ref 0.0–7.0)
HCT: 40.6 % (ref 38.7–49.9)
HGB: 14.2 g/dL (ref 13.0–17.1)
LYMPH#: 1.1 10*3/uL (ref 0.9–3.3)
MCHC: 35 g/dL (ref 32.0–35.9)
NEUT%: 64.1 % (ref 40.0–80.0)
WBC: 3.8 10*3/uL — ABNORMAL LOW (ref 4.0–10.0)

## 2012-08-14 MED ORDER — ONDANSETRON 8 MG/50ML IVPB (CHCC)
8.0000 mg | Freq: Once | INTRAVENOUS | Status: AC
  Administered 2012-08-14: 8 mg via INTRAVENOUS

## 2012-08-14 MED ORDER — PACLITAXEL PROTEIN-BOUND CHEMO INJECTION 100 MG
125.0000 mg/m2 | Freq: Once | INTRAVENOUS | Status: AC
  Administered 2012-08-14: 275 mg via INTRAVENOUS
  Filled 2012-08-14: qty 55

## 2012-08-14 MED ORDER — GEMCITABINE HCL CHEMO INJECTION 1 GM/26.3ML
900.0000 mg/m2 | Freq: Once | INTRAVENOUS | Status: AC
  Administered 2012-08-14: 2052 mg via INTRAVENOUS
  Filled 2012-08-14: qty 54

## 2012-08-14 MED ORDER — SODIUM CHLORIDE 0.9 % IV SOLN
Freq: Once | INTRAVENOUS | Status: AC
  Administered 2012-08-14: 12:00:00 via INTRAVENOUS

## 2012-08-14 MED ORDER — DEXAMETHASONE SODIUM PHOSPHATE 10 MG/ML IJ SOLN
10.0000 mg | Freq: Once | INTRAMUSCULAR | Status: AC
  Administered 2012-08-14: 10 mg via INTRAVENOUS

## 2012-08-14 NOTE — Patient Instructions (Signed)
Nanoparticle Albumin-Bound Paclitaxel injection What is this medicine? NANOPARTICLE ALBUMIN-BOUND PACLITAXEL (Na no PAHR ti kuhl al BYOO muhn-bound PAK li TAX el) is a chemotherapy drug. It targets fast dividing cells, like cancer cells, and causes these cells to die. This medicine is used to treat advanced breast cancer and advanced lung cancer. This medicine may be used for other purposes; ask your health care provider or pharmacist if you have questions. What should I tell my health care provider before I take this medicine? They need to know if you have any of these conditions: -kidney disease -liver disease -low blood counts, like low platelets, red blood cells, or white blood cells -recent or ongoing radiation therapy -an unusual or allergic reaction to paclitaxel, albumin, other chemotherapy, other medicines, foods, dyes, or preservatives -pregnant or trying to get pregnant -breast-feeding How should I use this medicine? This drug is given as an infusion into a vein. It is administered in a hospital or clinic by a specially trained health care professional. Talk to your pediatrician regarding the use of this medicine in children. Special care may be needed. Overdosage: If you think you have taken too much of this medicine contact a poison control center or emergency room at once. NOTE: This medicine is only for you. Do not share this medicine with others. What if I miss a dose? It is important not to miss your dose. Call your doctor or health care professional if you are unable to keep an appointment. What may interact with this medicine? This medicine may also interact with the following medications: -cyclosporine -dexamethasone -diazepam -ketoconazole -medicines to increase blood counts like filgrastim, pegfilgrastim, sargramostim -other chemotherapy drugs like cisplatin, doxorubicin, epirubicin, etoposide, teniposide,  vincristine -quinidine -testosterone -vaccines -verapamil Talk to your doctor or health care professional before taking any of these medicines: -acetaminophen -aspirin -ibuprofen -ketoprofen -naproxen This list may not describe all possible interactions. Give your health care provider a list of all the medicines, herbs, non-prescription drugs, or dietary supplements you use. Also tell them if you smoke, drink alcohol, or use illegal drugs. Some items may interact with your medicine. What should I watch for while using this medicine? Your condition will be monitored carefully while you are receiving this medicine. You will need important blood work done while you are taking this medicine. This drug may make you feel generally unwell. This is not uncommon, as chemotherapy can affect healthy cells as well as cancer cells. Report any side effects. Continue your course of treatment even though you feel ill unless your doctor tells you to stop. In some cases, you may be given additional medicines to help with side effects. Follow all directions for their use. Call your doctor or health care professional for advice if you get a fever, chills or sore throat, or other symptoms of a cold or flu. Do not treat yourself. This drug decreases your body's ability to fight infections. Try to avoid being around people who are sick. This medicine may increase your risk to bruise or bleed. Call your doctor or health care professional if you notice any unusual bleeding. Be careful brushing and flossing your teeth or using a toothpick because you may get an infection or bleed more easily. If you have any dental work done, tell your dentist you are receiving this medicine. Avoid taking products that contain aspirin, acetaminophen, ibuprofen, naproxen, or ketoprofen unless instructed by your doctor. These medicines may hide a fever. Do not become pregnant while taking this medicine. Women should   inform their doctor if  they wish to become pregnant or think they might be pregnant. There is a potential for serious side effects to an unborn child. Talk to your health care professional or pharmacist for more information. Do not breast-feed an infant while taking this medicine. Men are advised not to father a child while receiving this medicine. What side effects may I notice from receiving this medicine? Side effects that you should report to your doctor or health care professional as soon as possible: -allergic reactions like skin rash, itching or hives, swelling of the face, lips, or tongue -low blood counts - This drug may decrease the number of white blood cells, red blood cells and platelets. You may be at increased risk for infections and bleeding. -signs of infection - fever or chills, cough, sore throat, pain or difficulty passing urine -signs of decreased platelets or bleeding - bruising, pinpoint red spots on the skin, black, tarry stools, nosebleeds -signs of decreased red blood cells - unusually weak or tired, fainting spells, lightheadedness -breathing problems -changes in vision -chest pain -high or low blood pressure -mouth sores -nausea and vomiting -pain, swelling, redness or irritation at the injection site -pain, tingling, numbness in the hands or feet -slow or irregular heartbeat -swelling of the ankle, feet, hands Side effects that usually do not require medical attention (report to your doctor or health care professional if they continue or are bothersome): -aches, pains -changes in the color of fingernails -diarrhea -hair loss -loss of appetite This list may not describe all possible side effects. Call your doctor for medical advice about side effects. You may report side effects to FDA at 1-800-FDA-1088. Where should I keep my medicine? This drug is given in a hospital or clinic and will not be stored at home. NOTE: This sheet is a summary. It may not cover all possible information.  If you have questions about this medicine, talk to your doctor, pharmacist, or health care provider.  2013, [employer]. (12/24/2010 4:13:49 PM) Gemcitabine injection What is this medicine? GEMCITABINE (jem SIT a been) is a chemotherapy drug. This medicine is used to treat many types of cancer like breast cancer, lung cancer, pancreatic cancer, and ovarian cancer. This medicine may be used for other purposes; ask your health care provider or pharmacist if you have questions. What should I tell my health care provider before I take this medicine? They need to know if you have any of these conditions: -blood disorders -infection -kidney disease -liver disease -recent or ongoing radiation therapy -an unusual or allergic reaction to gemcitabine, other chemotherapy, other medicines, foods, dyes, or preservatives -pregnant or trying to get pregnant -breast-feeding How should I use this medicine? This drug is given as an infusion into a vein. It is administered in a hospital or clinic by a specially trained health care professional. Talk to your pediatrician regarding the use of this medicine in children. Special care may be needed. Overdosage: If you think you have taken too much of this medicine contact a poison control center or emergency room at once. NOTE: This medicine is only for you. Do not share this medicine with others. What if I miss a dose? It is important not to miss your dose. Call your doctor or health care professional if you are unable to keep an appointment. What may interact with this medicine? -medicines to increase blood counts like filgrastim, pegfilgrastim, sargramostim -some other chemotherapy drugs like cisplatin -vaccines Talk to your doctor or health care professional   before taking any of these medicines: -acetaminophen -aspirin -ibuprofen -ketoprofen -naproxen This list may not describe all possible interactions. Give your health care provider a list  of all the medicines, herbs, non-prescription drugs, or dietary supplements you use. Also tell them if you smoke, drink alcohol, or use illegal drugs. Some items may interact with your medicine. What should I watch for while using this medicine? Visit your doctor for checks on your progress. This drug may make you feel generally unwell. This is not uncommon, as chemotherapy can affect healthy cells as well as cancer cells. Report any side effects. Continue your course of treatment even though you feel ill unless your doctor tells you to stop. In some cases, you may be given additional medicines to help with side effects. Follow all directions for their use. Call your doctor or health care professional for advice if you get a fever, chills or sore throat, or other symptoms of a cold or flu. Do not treat yourself. This drug decreases your body's ability to fight infections. Try to avoid being around people who are sick. This medicine may increase your risk to bruise or bleed. Call your doctor or health care professional if you notice any unusual bleeding. Be careful brushing and flossing your teeth or using a toothpick because you may get an infection or bleed more easily. If you have any dental work done, tell your dentist you are receiving this medicine. Avoid taking products that contain aspirin, acetaminophen, ibuprofen, naproxen, or ketoprofen unless instructed by your doctor. These medicines may hide a fever. Women should inform their doctor if they wish to become pregnant or think they might be pregnant. There is a potential for serious side effects to an unborn child. Talk to your health care professional or pharmacist for more information. Do not breast-feed an infant while taking this medicine. What side effects may I notice from receiving this medicine? Side effects that you should report to your doctor or health care professional as soon as possible: -allergic reactions like skin rash, itching  or hives, swelling of the face, lips, or tongue -low blood counts - this medicine may decrease the number of white blood cells, red blood cells and platelets. You may be at increased risk for infections and bleeding. -signs of infection - fever or chills, cough, sore throat, pain or difficulty passing urine -signs of decreased platelets or bleeding - bruising, pinpoint red spots on the skin, black, tarry stools, blood in the urine -signs of decreased red blood cells - unusually weak or tired, fainting spells, lightheadedness -breathing problems -chest pain -mouth sores -nausea and vomiting -pain, swelling, redness at site where injected -pain, tingling, numbness in the hands or feet -stomach pain -swelling of ankles, feet, hands -unusual bleeding Side effects that usually do not require medical attention (report to your doctor or health care professional if they continue or are bothersome): -constipation -diarrhea -hair loss -loss of appetite -stomach upset This list may not describe all possible side effects. Call your doctor for medical advice about side effects. You may report side effects to FDA at 1-800-FDA-1088. Where should I keep my medicine? This drug is given in a hospital or clinic and will not be stored at home. NOTE: This sheet is a summary. It may not cover all possible information. If you have questions about this medicine, talk to your doctor, pharmacist, or health care provider.  2013, [employer]. (06/30/2007 6:45:54 PM)  

## 2012-08-21 ENCOUNTER — Ambulatory Visit: Payer: BC Managed Care – PPO

## 2012-08-21 ENCOUNTER — Ambulatory Visit (HOSPITAL_BASED_OUTPATIENT_CLINIC_OR_DEPARTMENT_OTHER)
Admission: RE | Admit: 2012-08-21 | Discharge: 2012-08-21 | Disposition: A | Discharge status: Home / Self Care | Payer: BC Managed Care – PPO | Source: Ambulatory Visit | Attending: Medical | Admitting: Medical

## 2012-08-21 ENCOUNTER — Other Ambulatory Visit (HOSPITAL_BASED_OUTPATIENT_CLINIC_OR_DEPARTMENT_OTHER): Payer: BC Managed Care – PPO | Admitting: Lab

## 2012-08-21 ENCOUNTER — Other Ambulatory Visit (HOSPITAL_BASED_OUTPATIENT_CLINIC_OR_DEPARTMENT_OTHER): Payer: BC Managed Care – PPO

## 2012-08-21 ENCOUNTER — Other Ambulatory Visit: Payer: Self-pay | Admitting: Medical

## 2012-08-21 DIAGNOSIS — R609 Edema, unspecified: Secondary | ICD-10-CM | POA: Insufficient documentation

## 2012-08-21 DIAGNOSIS — M7989 Other specified soft tissue disorders: Secondary | ICD-10-CM

## 2012-08-21 DIAGNOSIS — C250 Malignant neoplasm of head of pancreas: Secondary | ICD-10-CM

## 2012-08-21 DIAGNOSIS — L539 Erythematous condition, unspecified: Secondary | ICD-10-CM | POA: Insufficient documentation

## 2012-08-21 DIAGNOSIS — D751 Secondary polycythemia: Secondary | ICD-10-CM

## 2012-08-21 DIAGNOSIS — I83893 Varicose veins of bilateral lower extremities with other complications: Secondary | ICD-10-CM | POA: Insufficient documentation

## 2012-08-21 LAB — CBC WITH DIFFERENTIAL (CANCER CENTER ONLY)
BASO#: 0 10*3/uL (ref 0.0–0.2)
EOS%: 1.1 % (ref 0.0–7.0)
HGB: 13.5 g/dL (ref 13.0–17.1)
LYMPH%: 20 % (ref 14.0–48.0)
MCH: 32.9 pg (ref 28.0–33.4)
MCHC: 34.5 g/dL (ref 32.0–35.9)
MONO%: 8.7 % (ref 0.0–13.0)
NEUT#: 2.7 10*3/uL (ref 1.5–6.5)
NEUT%: 69.7 % (ref 40.0–80.0)

## 2012-08-21 NOTE — Progress Notes (Signed)
Pt here for chemotherapy but his Plt count is only 46.  Per Eunice Blase PA no treatment today.  Pt discharged to home.

## 2012-08-27 ENCOUNTER — Other Ambulatory Visit: Payer: Self-pay | Admitting: *Deleted

## 2012-08-27 DIAGNOSIS — C259 Malignant neoplasm of pancreas, unspecified: Secondary | ICD-10-CM

## 2012-08-28 ENCOUNTER — Ambulatory Visit (HOSPITAL_BASED_OUTPATIENT_CLINIC_OR_DEPARTMENT_OTHER): Payer: BC Managed Care – PPO

## 2012-08-28 ENCOUNTER — Other Ambulatory Visit: Payer: BC Managed Care – PPO | Admitting: Lab

## 2012-08-28 ENCOUNTER — Other Ambulatory Visit (HOSPITAL_BASED_OUTPATIENT_CLINIC_OR_DEPARTMENT_OTHER): Payer: BC Managed Care – PPO

## 2012-08-28 VITALS — BP 117/74 | HR 92 | Temp 98.2°F | Resp 18

## 2012-08-28 DIAGNOSIS — C259 Malignant neoplasm of pancreas, unspecified: Secondary | ICD-10-CM

## 2012-08-28 DIAGNOSIS — C778 Secondary and unspecified malignant neoplasm of lymph nodes of multiple regions: Secondary | ICD-10-CM

## 2012-08-28 DIAGNOSIS — C250 Malignant neoplasm of head of pancreas: Secondary | ICD-10-CM

## 2012-08-28 DIAGNOSIS — Z5111 Encounter for antineoplastic chemotherapy: Secondary | ICD-10-CM

## 2012-08-28 LAB — CBC WITH DIFFERENTIAL (CANCER CENTER ONLY)
EOS%: 1.9 % (ref 0.0–7.0)
MCH: 33 pg (ref 28.0–33.4)
MCHC: 34.5 g/dL (ref 32.0–35.9)
MONO%: 14.4 % — ABNORMAL HIGH (ref 0.0–13.0)
NEUT#: 4 10*3/uL (ref 1.5–6.5)
Platelets: 346 10*3/uL (ref 145–400)
RDW: 14.7 % (ref 11.1–15.7)

## 2012-08-28 MED ORDER — DEXAMETHASONE SODIUM PHOSPHATE 10 MG/ML IJ SOLN
10.0000 mg | Freq: Once | INTRAMUSCULAR | Status: AC
  Administered 2012-08-28: 10 mg via INTRAVENOUS

## 2012-08-28 MED ORDER — SODIUM CHLORIDE 0.9 % IV SOLN
Freq: Once | INTRAVENOUS | Status: AC
  Administered 2012-08-28: 10:00:00 via INTRAVENOUS

## 2012-08-28 MED ORDER — ONDANSETRON 8 MG/50ML IVPB (CHCC)
8.0000 mg | Freq: Once | INTRAVENOUS | Status: AC
  Administered 2012-08-28: 8 mg via INTRAVENOUS

## 2012-08-28 MED ORDER — PACLITAXEL PROTEIN-BOUND CHEMO INJECTION 100 MG
125.0000 mg/m2 | Freq: Once | INTRAVENOUS | Status: AC
  Administered 2012-08-28: 275 mg via INTRAVENOUS
  Filled 2012-08-28: qty 55

## 2012-08-28 MED ORDER — HEPARIN SOD (PORK) LOCK FLUSH 100 UNIT/ML IV SOLN
500.0000 [IU] | Freq: Once | INTRAVENOUS | Status: DC | PRN
  Filled 2012-08-28: qty 5

## 2012-08-28 MED ORDER — SODIUM CHLORIDE 0.9 % IV SOLN
800.0000 mg/m2 | Freq: Once | INTRAVENOUS | Status: AC
  Administered 2012-08-28: 1824 mg via INTRAVENOUS
  Filled 2012-08-28: qty 48

## 2012-08-28 MED ORDER — SODIUM CHLORIDE 0.9 % IJ SOLN
10.0000 mL | INTRAMUSCULAR | Status: DC | PRN
  Filled 2012-08-28: qty 10

## 2012-08-28 NOTE — Patient Instructions (Signed)
Gemcitabine injection  What is this medicine?  GEMCITABINE (jem SIT a been) is a chemotherapy drug. This medicine is used to treat many types of cancer like breast cancer, lung cancer, pancreatic cancer, and ovarian cancer.  This medicine may be used for other purposes; ask your health care provider or pharmacist if you have questions.  What should I tell my health care provider before I take this medicine?  They need to know if you have any of these conditions:  -blood disorders  -infection  -kidney disease  -liver disease  -recent or ongoing radiation therapy  -an unusual or allergic reaction to gemcitabine, other chemotherapy, other medicines, foods, dyes, or preservatives  -pregnant or trying to get pregnant  -breast-feeding  How should I use this medicine?  This drug is given as an infusion into a vein. It is administered in a hospital or clinic by a specially trained health care professional.  Talk to your pediatrician regarding the use of this medicine in children. Special care may be needed.  Overdosage: If you think you have taken too much of this medicine contact a poison control center or emergency room at once.  NOTE: This medicine is only for you. Do not share this medicine with others.  What if I miss a dose?  It is important not to miss your dose. Call your doctor or health care professional if you are unable to keep an appointment.  What may interact with this medicine?  -medicines to increase blood counts like filgrastim, pegfilgrastim, sargramostim  -some other chemotherapy drugs like cisplatin  -vaccines  Talk to your doctor or health care professional before taking any of these medicines:  -acetaminophen  -aspirin  -ibuprofen  -ketoprofen  -naproxen  This list may not describe all possible interactions. Give your health care provider a list of all the medicines, herbs, non-prescription drugs, or dietary supplements you use. Also tell them if you smoke, drink alcohol, or use illegal drugs. Some  items may interact with your medicine.  What should I watch for while using this medicine?  Visit your doctor for checks on your progress. This drug may make you feel generally unwell. This is not uncommon, as chemotherapy can affect healthy cells as well as cancer cells. Report any side effects. Continue your course of treatment even though you feel ill unless your doctor tells you to stop.  In some cases, you may be given additional medicines to help with side effects. Follow all directions for their use.  Call your doctor or health care professional for advice if you get a fever, chills or sore throat, or other symptoms of a cold or flu. Do not treat yourself. This drug decreases your body's ability to fight infections. Try to avoid being around people who are sick.  This medicine may increase your risk to bruise or bleed. Call your doctor or health care professional if you notice any unusual bleeding.  Be careful brushing and flossing your teeth or using a toothpick because you may get an infection or bleed more easily. If you have any dental work done, tell your dentist you are receiving this medicine.  Avoid taking products that contain aspirin, acetaminophen, ibuprofen, naproxen, or ketoprofen unless instructed by your doctor. These medicines may hide a fever.  Women should inform their doctor if they wish to become pregnant or think they might be pregnant. There is a potential for serious side effects to an unborn child. Talk to your health care professional or pharmacist for   more information. Do not breast-feed an infant while taking this medicine.  What side effects may I notice from receiving this medicine?  Side effects that you should report to your doctor or health care professional as soon as possible:  -allergic reactions like skin rash, itching or hives, swelling of the face, lips, or tongue  -low blood counts - this medicine may decrease the number of white blood cells, red blood cells and  platelets. You may be at increased risk for infections and bleeding.  -signs of infection - fever or chills, cough, sore throat, pain or difficulty passing urine  -signs of decreased platelets or bleeding - bruising, pinpoint red spots on the skin, black, tarry stools, blood in the urine  -signs of decreased red blood cells - unusually weak or tired, fainting spells, lightheadedness  -breathing problems  -chest pain  -mouth sores  -nausea and vomiting  -pain, swelling, redness at site where injected  -pain, tingling, numbness in the hands or feet  -stomach pain  -swelling of ankles, feet, hands  -unusual bleeding  Side effects that usually do not require medical attention (report to your doctor or health care professional if they continue or are bothersome):  -constipation  -diarrhea  -hair loss  -loss of appetite  -stomach upset  This list may not describe all possible side effects. Call your doctor for medical advice about side effects. You may report side effects to FDA at 1-800-FDA-1088.  Where should I keep my medicine?  This drug is given in a hospital or clinic and will not be stored at home.  NOTE: This sheet is a summary. It may not cover all possible information. If you have questions about this medicine, talk to your doctor, pharmacist, or health care provider.   2013, [employer]. (06/30/2007 6:45:54 PM)  Nanoparticle Albumin-Bound Paclitaxel injection  What is this medicine?  NANOPARTICLE ALBUMIN-BOUND PACLITAXEL (Na no PAHR ti kuhl al BYOO muhn-bound PAK li TAX el) is a chemotherapy drug. It targets fast dividing cells, like cancer cells, and causes these cells to die. This medicine is used to treat advanced breast cancer and advanced lung cancer.  This medicine may be used for other purposes; ask your health care provider or pharmacist if you have questions.  What should I tell my health care provider before I take this medicine?  They need to know if you have any of these  conditions:  -kidney disease  -liver disease  -low blood counts, like low platelets, red blood cells, or white blood cells  -recent or ongoing radiation therapy  -an unusual or allergic reaction to paclitaxel, albumin, other chemotherapy, other medicines, foods, dyes, or preservatives  -pregnant or trying to get pregnant  -breast-feeding  How should I use this medicine?  This drug is given as an infusion into a vein. It is administered in a hospital or clinic by a specially trained health care professional.  Talk to your pediatrician regarding the use of this medicine in children. Special care may be needed.  Overdosage: If you think you have taken too much of this medicine contact a poison control center or emergency room at once.  NOTE: This medicine is only for you. Do not share this medicine with others.  What if I miss a dose?  It is important not to miss your dose. Call your doctor or health care professional if you are unable to keep an appointment.  What may interact with this medicine?  This medicine may also interact with   the following medications:  -cyclosporine  -dexamethasone  -diazepam  -ketoconazole  -medicines to increase blood counts like filgrastim, pegfilgrastim, sargramostim  -other chemotherapy drugs like cisplatin, doxorubicin, epirubicin, etoposide, teniposide, vincristine  -quinidine  -testosterone  -vaccines  -verapamil  Talk to your doctor or health care professional before taking any of these medicines:  -acetaminophen  -aspirin  -ibuprofen  -ketoprofen  -naproxen  This list may not describe all possible interactions. Give your health care provider a list of all the medicines, herbs, non-prescription drugs, or dietary supplements you use. Also tell them if you smoke, drink alcohol, or use illegal drugs. Some items may interact with your medicine.  What should I watch for while using this medicine?  Your condition will be monitored carefully while you are receiving this medicine. You will  need important blood work done while you are taking this medicine.  This drug may make you feel generally unwell. This is not uncommon, as chemotherapy can affect healthy cells as well as cancer cells. Report any side effects. Continue your course of treatment even though you feel ill unless your doctor tells you to stop.  In some cases, you may be given additional medicines to help with side effects. Follow all directions for their use.  Call your doctor or health care professional for advice if you get a fever, chills or sore throat, or other symptoms of a cold or flu. Do not treat yourself. This drug decreases your body's ability to fight infections. Try to avoid being around people who are sick.  This medicine may increase your risk to bruise or bleed. Call your doctor or health care professional if you notice any unusual bleeding.  Be careful brushing and flossing your teeth or using a toothpick because you may get an infection or bleed more easily. If you have any dental work done, tell your dentist you are receiving this medicine.  Avoid taking products that contain aspirin, acetaminophen, ibuprofen, naproxen, or ketoprofen unless instructed by your doctor. These medicines may hide a fever.  Do not become pregnant while taking this medicine. Women should inform their doctor if they wish to become pregnant or think they might be pregnant. There is a potential for serious side effects to an unborn child. Talk to your health care professional or pharmacist for more information. Do not breast-feed an infant while taking this medicine.  Men are advised not to father a child while receiving this medicine.  What side effects may I notice from receiving this medicine?  Side effects that you should report to your doctor or health care professional as soon as possible:  -allergic reactions like skin rash, itching or hives, swelling of the face, lips, or tongue  -low blood counts - This drug may decrease the number of  white blood cells, red blood cells and platelets. You may be at increased risk for infections and bleeding.  -signs of infection - fever or chills, cough, sore throat, pain or difficulty passing urine  -signs of decreased platelets or bleeding - bruising, pinpoint red spots on the skin, black, tarry stools, nosebleeds  -signs of decreased red blood cells - unusually weak or tired, fainting spells, lightheadedness  -breathing problems  -changes in vision  -chest pain  -high or low blood pressure  -mouth sores  -nausea and vomiting  -pain, swelling, redness or irritation at the injection site  -pain, tingling, numbness in the hands or feet  -slow or irregular heartbeat  -swelling of the ankle, feet, hands

## 2012-09-01 ENCOUNTER — Other Ambulatory Visit: Payer: Self-pay | Admitting: Hematology & Oncology

## 2012-09-01 DIAGNOSIS — C259 Malignant neoplasm of pancreas, unspecified: Secondary | ICD-10-CM

## 2012-09-07 ENCOUNTER — Encounter (HOSPITAL_COMMUNITY): Payer: BC Managed Care – PPO

## 2012-09-11 ENCOUNTER — Ambulatory Visit (HOSPITAL_BASED_OUTPATIENT_CLINIC_OR_DEPARTMENT_OTHER): Payer: BC Managed Care – PPO | Admitting: Hematology & Oncology

## 2012-09-11 ENCOUNTER — Other Ambulatory Visit (HOSPITAL_BASED_OUTPATIENT_CLINIC_OR_DEPARTMENT_OTHER): Payer: BC Managed Care – PPO | Admitting: Lab

## 2012-09-11 ENCOUNTER — Ambulatory Visit: Payer: BC Managed Care – PPO

## 2012-09-11 VITALS — BP 117/72 | HR 84 | Temp 98.2°F | Resp 18 | Ht 73.0 in | Wt 205.0 lb

## 2012-09-11 DIAGNOSIS — C259 Malignant neoplasm of pancreas, unspecified: Secondary | ICD-10-CM

## 2012-09-11 LAB — CBC WITH DIFFERENTIAL (CANCER CENTER ONLY)
BASO%: 0.4 % (ref 0.0–2.0)
EOS%: 1.3 % (ref 0.0–7.0)
LYMPH%: 12.7 % — ABNORMAL LOW (ref 14.0–48.0)
MCHC: 33.9 g/dL (ref 32.0–35.9)
MCV: 98 fL (ref 82–98)
MONO#: 1.3 10*3/uL — ABNORMAL HIGH (ref 0.1–0.9)
MONO%: 16.2 % — ABNORMAL HIGH (ref 0.0–13.0)
Platelets: 138 10*3/uL — ABNORMAL LOW (ref 145–400)
RDW: 15.7 % (ref 11.1–15.7)
WBC: 7.9 10*3/uL (ref 4.0–10.0)

## 2012-09-11 LAB — CMP (CANCER CENTER ONLY)
AST: 131 U/L — ABNORMAL HIGH (ref 11–38)
BUN, Bld: 12 mg/dL (ref 7–22)
Calcium: 9.4 mg/dL (ref 8.0–10.3)
Chloride: 112 mEq/L — ABNORMAL HIGH (ref 98–108)
Creat: 0.8 mg/dl (ref 0.6–1.2)
Glucose, Bld: 116 mg/dL (ref 73–118)

## 2012-09-11 MED ORDER — PANCRELIPASE (LIP-PROT-AMYL) 24000-76000 UNITS PO CPEP: 1.0000 | ORAL_CAPSULE | Freq: Four times a day (QID) | ORAL | Status: DC

## 2012-09-11 NOTE — Progress Notes (Signed)
This office note has been dictated.

## 2012-09-12 LAB — CANCER ANTIGEN 19-9: CA 19-9: 2802 U/mL — ABNORMAL HIGH (ref <35.0)
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
## 2012-09-14 ENCOUNTER — Telehealth: Payer: Self-pay | Admitting: Internal Medicine

## 2012-09-14 ENCOUNTER — Ambulatory Visit (HOSPITAL_BASED_OUTPATIENT_CLINIC_OR_DEPARTMENT_OTHER)
Admission: RE | Admit: 2012-09-14 | Discharge: 2012-09-14 | Disposition: A | Discharge status: Home / Self Care | Payer: BC Managed Care – PPO | Source: Ambulatory Visit | Attending: Hematology & Oncology | Admitting: Hematology & Oncology

## 2012-09-14 ENCOUNTER — Encounter (HOSPITAL_BASED_OUTPATIENT_CLINIC_OR_DEPARTMENT_OTHER): Payer: Self-pay

## 2012-09-14 ENCOUNTER — Other Ambulatory Visit: Payer: Self-pay

## 2012-09-14 ENCOUNTER — Other Ambulatory Visit: Payer: Self-pay | Admitting: Medical

## 2012-09-14 DIAGNOSIS — K828 Other specified diseases of gallbladder: Secondary | ICD-10-CM | POA: Insufficient documentation

## 2012-09-14 DIAGNOSIS — R599 Enlarged lymph nodes, unspecified: Secondary | ICD-10-CM | POA: Insufficient documentation

## 2012-09-14 DIAGNOSIS — C259 Malignant neoplasm of pancreas, unspecified: Secondary | ICD-10-CM

## 2012-09-14 DIAGNOSIS — K869 Disease of pancreas, unspecified: Secondary | ICD-10-CM | POA: Insufficient documentation

## 2012-09-14 DIAGNOSIS — K8689 Other specified diseases of pancreas: Secondary | ICD-10-CM | POA: Insufficient documentation

## 2012-09-14 DIAGNOSIS — Z8547 Personal history of malignant neoplasm of testis: Secondary | ICD-10-CM | POA: Insufficient documentation

## 2012-09-14 DIAGNOSIS — Z9221 Personal history of antineoplastic chemotherapy: Secondary | ICD-10-CM | POA: Insufficient documentation

## 2012-09-14 MED ORDER — IOHEXOL 300 MG/ML  SOLN
100.0000 mL | Freq: Once | INTRAMUSCULAR | Status: AC | PRN
  Administered 2012-09-14: 100 mL via INTRAVENOUS

## 2012-09-14 NOTE — Progress Notes (Signed)
I received a call from Dr. Ty Hilts in radiology discussing the patient's recent CT scan. Dr. Ty Hilts was highly concerned that the patient was having biliary obstruction.  I called out to Dr. Marvell Fuller office with [facility].  I spoke with his nurse, Cordelia Pen who informed me that Dr. [name] was out of town,however she would have one of his partners review the CT scan.  I received a call back from [place] who states that the overall plan is to bring the patient in this Friday to have blood work including LFTs, PT/INR and then schedule an ERCP with stent next Tuesday.  I did try to give Mr. Vetsch a call, however I was unable to get in touch with him.  I will give him a call back.  If he is symptomatic we will get him over to GI sooner for his procedure.

## 2012-09-14 NOTE — Telephone Encounter (Signed)
I spoke with Eunice Blase from Dr. Gustavo Lah office she received a call from radiology CT scan from today shows near obstruction of bile duct  Patient is asymptomatic at this time, he denies nausea, pain, itching, jaundice or fever.  Dr. Rhea Belton reviewed CT scan and notes.  Per Dr. Rhea Belton patient will need ERCP/metal stent placement with MAC next week when Dr. [name] returns, and PT/INR and LFT's on Friday of this week.  Patient is advised of all the above he is aware to call for any changes in the above symptoms.  He is scheduled for [facility] 09/22/12 1:30 he is aware to arrive at 12:00.  I will mail instructions to his home, but they were reviewed with him over the phone and he verbalized understanding. Eunice Blase updated on plans for ERCP next week.

## 2012-09-15 ENCOUNTER — Encounter (HOSPITAL_COMMUNITY): Payer: Self-pay | Admitting: *Deleted

## 2012-09-15 ENCOUNTER — Other Ambulatory Visit: Payer: Self-pay

## 2012-09-15 DIAGNOSIS — C259 Malignant neoplasm of pancreas, unspecified: Secondary | ICD-10-CM

## 2012-09-16 ENCOUNTER — Ambulatory Visit (INDEPENDENT_AMBULATORY_CARE_PROVIDER_SITE_OTHER): Payer: BC Managed Care – PPO | Admitting: Physician Assistant

## 2012-09-16 ENCOUNTER — Encounter: Payer: Self-pay | Admitting: *Deleted

## 2012-09-16 ENCOUNTER — Other Ambulatory Visit (INDEPENDENT_AMBULATORY_CARE_PROVIDER_SITE_OTHER): Payer: BC Managed Care – PPO

## 2012-09-16 VITALS — BP 110/70 | HR 80 | Ht 73.0 in | Wt 209.4 lb

## 2012-09-16 DIAGNOSIS — C801 Malignant (primary) neoplasm, unspecified: Secondary | ICD-10-CM

## 2012-09-16 DIAGNOSIS — C7800 Secondary malignant neoplasm of unspecified lung: Secondary | ICD-10-CM

## 2012-09-16 DIAGNOSIS — K831 Obstruction of bile duct: Secondary | ICD-10-CM

## 2012-09-16 DIAGNOSIS — C259 Malignant neoplasm of pancreas, unspecified: Secondary | ICD-10-CM

## 2012-09-16 LAB — PROTIME-INR
INR: 1.2 ratio — ABNORMAL HIGH (ref 0.8–1.0)
Prothrombin Time: 12.2 s (ref 10.2–12.4)

## 2012-09-16 LAB — HEPATIC FUNCTION PANEL
ALT: 325 U/L — ABNORMAL HIGH (ref 0–53)
AST: 169 U/L — ABNORMAL HIGH (ref 0–37)
Albumin: 3.7 g/dL (ref 3.5–5.2)
Total Bilirubin: 6.2 mg/dL — ABNORMAL HIGH (ref 0.3–1.2)
Total Protein: 7.2 g/dL (ref 6.0–8.3)

## 2012-09-16 NOTE — Progress Notes (Signed)
Subjective:    Patient ID: Austin Escobar, male    DOB: 09/26/1955, 57 y.o.   MRN: [id number]  HPI  Austin Escobar is a pleasant 57-year-old white male known to Dr. Gessner who unfortunately was diagnosed with a metastatic pancreatic adenocarcinoma within the past few months. He is being treated by Dr. Ennever  and has completed 2 cycles of Gemzar and Abraxane. He underwent followup CT of the abdomen and pelvis for staging on 09/14/2012 and  was noted to have  interval decrease in volume of the mediastinal lymphadenopathy and no evidence of disease progression. The mass in the pancreatic head appears about the same size measuring 3.6 x 2.9 cm however there is now evidence of pancreatic ductal dilation and intra-and extrahepatic biliary ductal dilation as well as distention of the gallbladder felt secondary to slight enlargement of the pancreatic mass. Patient was initially to be scheduled for ERCP with stent placement next week with Dr. Gessner however the patient called and says that he has been having some upper abdominal pain after eating just over the past several days and feels that his procedure may need to be done sooner. He describes a tightness in the lower chest and epigastric area after eating he has not had any nausea or vomiting he does have a feeling of early satiety. He also has noticed increased abdominal pain with lying on his left side. Has not had any fever or chills. He had labs done on 09/11/2012, total bilirubin was 0.9 alkaline phosphatase 99 AST of 131 and ALT of 89 Labs were done before his office visit today and bilirubin is now 6.2 direct bilirubin 4.2 alkaline phosphatase 233 AST of 169 ALT of 325 her time 12.2 INR 1.2    Review of Systems  Constitutional: Positive for appetite change.  HENT: Negative.   Eyes: Negative.   Cardiovascular: Negative.   Gastrointestinal: Positive for abdominal pain.  Endocrine: Negative.   Genitourinary: Negative.   Allergic/Immunologic: Negative.    Neurological: Negative.   Hematological: Negative.   Psychiatric/Behavioral: Negative.    Outpatient Prescriptions Prior to Visit  Medication Sig Dispense Refill  . ASTAXANTHIN PO Take 2 capsules by mouth daily.       . bifidobacterium infantis (ALIGN) capsule Take 3 capsules by mouth daily.       . Cream Base (PCCA LIPODERM BASE) CREA Apply 10 mg topically daily. 10 mg daily rubbed into hairless area on body, hips      . dexamethasone (DECADRON) 4 MG tablet Take 1 tablet (4 mg total) by mouth 2 (two) times daily with a meal. Begin taking this one day after chemotherapy and take for 4 days for nausea  20 tablet  3  . dicyclomine (BENTYL) 10 MG capsule Take 10 mg by mouth as needed (bladder spasm).       . diltiazem (DILACOR XR) 240 MG 24 hr capsule Take 1 capsule (240 mg total) by mouth every morning.  30 capsule  3  . fish oil-omega-3 fatty acids 1000 MG capsule Take 2 g by mouth daily.      . Hyaluronic Acid-Vitamin C (HYALURONIC ACID PO) Take 4 tablets by mouth daily.       . LORazepam (ATIVAN) 1 MG tablet Take 1 tablet (1 mg total) by mouth every 8 (eight) hours. A needed for nausea or anxiety  30 tablet  2  . Multiple Vitamin (MULTIVITAMIN WITH MINERALS) TABS Take 4 tablets by mouth daily.       . ondansetron (ZOFRAN)   8 MG tablet Take 8 mg by mouth 2 (two) times daily. Take as needed for Nausea or Vomiting.      . Pancrelipase, Lip-Prot-Amyl, 24000 UNITS CPEP Take 1 capsule (24,000 Units total) by mouth 4 (four) times daily. Take 1 prior to each meal  180 capsule  6  . prochlorperazine (COMPAZINE) 10 MG tablet Take 1 tablet (10 mg total) by mouth every 6 (six) hours as needed. For nausea  30 tablet  3  . Pyridoxine HCl (VITAMIN B-6 CR PO) Take by mouth 2 (two) times daily.      . tadalafil (CIALIS) 20 MG tablet Take 20 mg by mouth daily as needed.       No facility-administered medications prior to visit.   No Known Allergies Patient Active Problem List   Diagnosis Date Noted  .  Pancreatic cancer 06/02/2012  . LLQ pain 05/29/2012  . Polycythemia, secondary 05/29/2012  . Personal history of colonic adenomas 04/29/2012  . Cellulitis and abscess of trunk   . LOW BACK PAIN   . ED (erectile dysfunction)   . Asthma   . A-fib 03/24/2012  . HTN (hypertension) 03/24/2012   family history includes Coronary artery disease in his father; Diabetes in his father; Hyperlipidemia in his father; Kidney disease in his father; Liver disease in his father; and Stroke in his father.  There is no history of Colon cancer, and Esophageal cancer, and Rectal cancer, and Stomach cancer, .     History  Substance Use Topics  . Smoking status: Never Smoker   . Smokeless tobacco: Never Used  . Alcohol Use: No     Comment: occasional    Objective:   Physical Exam well-developed white male in no acute distress blood pressure 110/70 pulse 80 height 6 foot 1 weight 209. HEENT; nontraumatic normocephalic EOMI PERRLA sclera are icteric, Neck; supple no JVD, Cardiovascular; regular rate and rhythm with S1-S2 no murmur or gallop, Pulmonary; clear bilaterally, Abdomen; soft basically nontender there is no palpable mass or hepatosplenomegaly bowel sounds are active, Extremities ;no clubbing cyanosis or edema his skin appears mildly jaundiced. Psych; mood and affect normal and appropriate        Assessment & Plan:  #1 57-year-old white male with metastatic pancreatic cancer now presenting with malignant biliary obstruction and new onset of jaundice. Patient has completed 2 cycles of chemotherapy and had undergone repeat CT scan for restaging earlier this week and was noted to have intra-and extrahepatic biliary ductal dilation, distention of the gallbladder and slight increase in size and the pancreatic mass.  #2 history of atrial fibrillation #3 hypertension #4 history of adenomatous colon polyps  Plan; patient is scheduled for ERCP with metal stent placement with Dr. Kaplan tomorrow 09/17/2012  in the OR a [place]. Procedure was discussed in detail with the patient and he is agreeable to proceed..Risks and benefits were discussed including 5-6% risk of pancreatitis Patient is scheduled to undergo another cycle of chemotherapy on 09/18/2012-will notify Dr.Ennever as this may need to be delayed. 

## 2012-09-17 ENCOUNTER — Encounter (HOSPITAL_COMMUNITY): Payer: Self-pay | Admitting: Certified Registered Nurse Anesthetist

## 2012-09-17 ENCOUNTER — Ambulatory Visit (HOSPITAL_COMMUNITY): Payer: BC Managed Care – PPO | Admitting: Certified Registered Nurse Anesthetist

## 2012-09-17 ENCOUNTER — Other Ambulatory Visit: Payer: Self-pay | Admitting: *Deleted

## 2012-09-17 ENCOUNTER — Ambulatory Visit (HOSPITAL_COMMUNITY)
Admission: RE | Admit: 2012-09-17 | Discharge: 2012-09-17 | Disposition: A | Discharge status: Home / Self Care | Payer: BC Managed Care – PPO | Source: Ambulatory Visit | Attending: Gastroenterology | Admitting: Gastroenterology

## 2012-09-17 ENCOUNTER — Telehealth: Payer: Self-pay | Admitting: Hematology & Oncology

## 2012-09-17 ENCOUNTER — Other Ambulatory Visit: Payer: Self-pay

## 2012-09-17 ENCOUNTER — Encounter (HOSPITAL_COMMUNITY)
Admission: RE | Disposition: A | Discharge status: Home / Self Care | Payer: Self-pay | Source: Ambulatory Visit | Attending: Gastroenterology

## 2012-09-17 ENCOUNTER — Encounter (HOSPITAL_COMMUNITY): Payer: Self-pay | Admitting: *Deleted

## 2012-09-17 ENCOUNTER — Ambulatory Visit (HOSPITAL_COMMUNITY): Payer: BC Managed Care – PPO

## 2012-09-17 DIAGNOSIS — I10 Essential (primary) hypertension: Secondary | ICD-10-CM | POA: Insufficient documentation

## 2012-09-17 DIAGNOSIS — Z860100 Personal history of colon polyps, unspecified: Secondary | ICD-10-CM | POA: Insufficient documentation

## 2012-09-17 DIAGNOSIS — Z79899 Other long term (current) drug therapy: Secondary | ICD-10-CM | POA: Insufficient documentation

## 2012-09-17 DIAGNOSIS — C801 Malignant (primary) neoplasm, unspecified: Secondary | ICD-10-CM | POA: Insufficient documentation

## 2012-09-17 DIAGNOSIS — C250 Malignant neoplasm of head of pancreas: Secondary | ICD-10-CM | POA: Insufficient documentation

## 2012-09-17 DIAGNOSIS — N529 Male erectile dysfunction, unspecified: Secondary | ICD-10-CM | POA: Insufficient documentation

## 2012-09-17 DIAGNOSIS — I4891 Unspecified atrial fibrillation: Secondary | ICD-10-CM | POA: Insufficient documentation

## 2012-09-17 DIAGNOSIS — C259 Malignant neoplasm of pancreas, unspecified: Secondary | ICD-10-CM

## 2012-09-17 DIAGNOSIS — J45909 Unspecified asthma, uncomplicated: Secondary | ICD-10-CM | POA: Insufficient documentation

## 2012-09-17 DIAGNOSIS — D751 Secondary polycythemia: Secondary | ICD-10-CM | POA: Insufficient documentation

## 2012-09-17 DIAGNOSIS — C7800 Secondary malignant neoplasm of unspecified lung: Secondary | ICD-10-CM

## 2012-09-17 DIAGNOSIS — K831 Obstruction of bile duct: Secondary | ICD-10-CM

## 2012-09-17 HISTORY — PX: ERCP: SHX5425

## 2012-09-17 SURGERY — ERCP, WITH INTERVENTION IF INDICATED
Anesthesia: General

## 2012-09-17 SURGERY — ERCP, WITH INTERVENTION IF INDICATED
Anesthesia: Moderate Sedation

## 2012-09-17 MED ORDER — SUCCINYLCHOLINE CHLORIDE 20 MG/ML IJ SOLN
INTRAMUSCULAR | Status: DC | PRN
  Administered 2012-09-17: 100 mg via INTRAVENOUS

## 2012-09-17 MED ORDER — MIDAZOLAM HCL 5 MG/5ML IJ SOLN
INTRAMUSCULAR | Status: DC | PRN
  Administered 2012-09-17 (×2): 2 mg via INTRAVENOUS

## 2012-09-17 MED ORDER — LIDOCAINE HCL (CARDIAC) 20 MG/ML IV SOLN
INTRAVENOUS | Status: DC | PRN
  Administered 2012-09-17: 100 mg via INTRAVENOUS

## 2012-09-17 MED ORDER — SODIUM CHLORIDE 0.9 % IV SOLN: INTRAVENOUS | Status: DC

## 2012-09-17 MED ORDER — CIPROFLOXACIN IN D5W 400 MG/200ML IV SOLN
INTRAVENOUS | Status: AC
  Filled 2012-09-17: qty 200

## 2012-09-17 MED ORDER — PROPOFOL 10 MG/ML IV BOLUS
INTRAVENOUS | Status: DC | PRN
  Administered 2012-09-17: 20 mg via INTRAVENOUS
  Administered 2012-09-17: 40 mg via INTRAVENOUS
  Administered 2012-09-17: 200 mg via INTRAVENOUS
  Administered 2012-09-17: 50 mg via INTRAVENOUS

## 2012-09-17 MED ORDER — GLUCAGON HCL (RDNA) 1 MG IJ SOLR
INTRAMUSCULAR | Status: AC
  Filled 2012-09-17: qty 2

## 2012-09-17 MED ORDER — ONDANSETRON HCL 4 MG/2ML IJ SOLN
INTRAMUSCULAR | Status: DC | PRN
  Administered 2012-09-17: 4 mg via INTRAVENOUS

## 2012-09-17 MED ORDER — EPHEDRINE SULFATE 50 MG/ML IJ SOLN
INTRAMUSCULAR | Status: DC | PRN
  Administered 2012-09-17 (×2): 10 mg via INTRAVENOUS
  Administered 2012-09-17: 5 mg via INTRAVENOUS

## 2012-09-17 MED ORDER — LACTATED RINGERS IV SOLN: INTRAVENOUS | Status: DC

## 2012-09-17 MED ORDER — LACTATED RINGERS IV SOLN
INTRAVENOUS | Status: DC
  Administered 2012-09-17: 1000 mL via INTRAVENOUS

## 2012-09-17 MED ORDER — PHENYLEPHRINE HCL 10 MG/ML IJ SOLN
INTRAMUSCULAR | Status: DC | PRN
  Administered 2012-09-17 (×5): 80 ug via INTRAVENOUS

## 2012-09-17 MED ORDER — PROMETHAZINE HCL 25 MG/ML IJ SOLN: 6.2500 mg | INTRAMUSCULAR | Status: DC | PRN

## 2012-09-17 MED ORDER — FENTANYL CITRATE 0.05 MG/ML IJ SOLN
INTRAMUSCULAR | Status: DC | PRN
  Administered 2012-09-17 (×2): 50 ug via INTRAVENOUS

## 2012-09-17 MED ORDER — CIPROFLOXACIN IN D5W 400 MG/200ML IV SOLN
400.0000 mg | Freq: Once | INTRAVENOUS | Status: AC
  Administered 2012-09-17: 400 mg via INTRAVENOUS
  Filled 2012-09-17: qty 200

## 2012-09-17 MED ORDER — DEXAMETHASONE SODIUM PHOSPHATE 10 MG/ML IJ SOLN
INTRAMUSCULAR | Status: DC | PRN
  Administered 2012-09-17: 10 mg via INTRAVENOUS

## 2012-09-17 NOTE — Op Note (Addendum)
[facility] ,   ERCP PROCEDURE REPORT  PATIENT: Austin Escobar, Austin Escobar.  MR# :[id number] BIRTHDATE: 1955-09-04  GENDER: Male ENDOSCOPIST: Louis Meckel, MD REFERRED BY: PROCEDURE DATE:  09/17/2012 PROCEDURE:   ERCP with stent placement ASA CLASS:   Class III INDICATIONS:bile duct stricture. MEDICATIONS: Cipro 400 mg IV and MAC sedation, administered by CRNA  TOPICAL ANESTHETIC:  DESCRIPTION OF PROCEDURE:   After the risks benefits and alternatives of the procedure were thoroughly explained, informed consent was obtained.  The PENTAX [id number]  endoscope was introduced through the mouth  and advanced to the second portion of the duodenum .  1.  J shaped stomach.  Duodenal bulb could not be intubated until patient was placed into the lateral position. CBD was selectively cannulated with a 0.87mm guidewire.  Injection demonstrated a 3mm stricture in the lower 1/3 of the CBD with dilitation of the duct proximal to the stricture.  A minimum amount of dye was injected above the stricture. A 10Fr 6cm covered biliary stent was placed across the stricture. Prompt emptying of bile was noted. 2.  J shaped stomach.  Duodenal bulb could not be intubated until patient was placed into the lateral position. CBD was selectively cannulated with a 0.24mm guidewire.  Injection demonstrated a 3mm stricture in the lower 1/3 of the CBD with dilitation of the duct proximal to the stricture.  A minimum amount of dye was injected above the stricture. A 10Fr 6cm covered metal  biliary stent was placed across the stricture.  Prompt emptying of bile was noted. The scope was then completely withdrawn from the patient and the procedure terminated.     COMPLICATIONS:  ENDOSCOPIC IMPRESSION: 1.   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 [name] in 2-3 weeks    _______________________________ eSigned:  Louis Meckel,  MD 09/17/2012 2:46 PM   [id number] [name], MD Stan Head, MD Zelphia Cairo MD

## 2012-09-17 NOTE — Progress Notes (Signed)
Pt having ERCP tomorrow. To move chemo appts out 1 week per Dr [name]. To see him prior to chemo on 7/25. Gave dates to scheduling for next 2 cycles.

## 2012-09-17 NOTE — Interval H&P Note (Signed)
History and Physical Interval Note:  09/17/2012 1:13 PM  Austin Escobar  has presented today for surgery, with the diagnosis of ercp  The various methods of treatment have been discussed with the patient and family. After consideration of risks, benefits and other options for treatment, the patient has consented to  Procedure(s): ENDOSCOPIC RETROGRADE CHOLANGIOPANCREATOGRAPHY (ERCP) (N/A) as a surgical intervention .  The patient's history has been reviewed, patient examined, no change in status, stable for surgery.  I have reviewed the patient's chart and labs.  Questions were answered to the patient's satisfaction.    The recent H&P (dated *09/16/12**) was reviewed, the patient was examined and there is no change in the patients condition since that H&P was completed.   Melvia Heaps  09/17/2012, 1:14 PM    Melvia Heaps

## 2012-09-17 NOTE — H&P (View-Only) (Signed)
Subjective:    Patient ID: Austin Escobar, male    DOB: 1955-11-17, 57 y.o.   MRN: [id number]  HPI  Austin Escobar is a pleasant 57 year old white male known to Dr. [name] who unfortunately was diagnosed with a metastatic pancreatic adenocarcinoma within the past few months. He is being treated by Dr. [name]  and has completed 2 cycles of Gemzar and Abraxane. He underwent followup CT of the abdomen and pelvis for staging on 09/14/2012 and  was noted to have  interval decrease in volume of the mediastinal lymphadenopathy and no evidence of disease progression. The mass in the pancreatic head appears about the same size measuring 3.6 x 2.9 cm however there is now evidence of pancreatic ductal dilation and intra-and extrahepatic biliary ductal dilation as well as distention of the gallbladder felt secondary to slight enlargement of the pancreatic mass. Patient was initially to be scheduled for ERCP with stent placement next week with Dr. [name] however the patient called and says that he has been having some upper abdominal pain after eating just over the past several days and feels that his procedure may need to be done sooner. He describes a tightness in the lower chest and epigastric area after eating he has not had any nausea or vomiting he does have a feeling of early satiety. He also has noticed increased abdominal pain with lying on his left side. Has not had any fever or chills. He had labs done on 09/11/2012, total bilirubin was 0.9 alkaline phosphatase 99 AST of 131 and ALT of 89 Labs were done before his office visit today and bilirubin is now 6.2 direct bilirubin 4.2 alkaline phosphatase 233 AST of 169 ALT of 325 her time 12.2 INR 1.2    Review of Systems  Constitutional: Positive for appetite change.  HENT: Negative.   Eyes: Negative.   Cardiovascular: Negative.   Gastrointestinal: Positive for abdominal pain.  Endocrine: Negative.   Genitourinary: Negative.   Allergic/Immunologic: Negative.    Neurological: Negative.   Hematological: Negative.   Psychiatric/Behavioral: Negative.    Outpatient Prescriptions Prior to Visit  Medication Sig Dispense Refill  . ASTAXANTHIN PO Take 2 capsules by mouth daily.       . bifidobacterium infantis (ALIGN) capsule Take 3 capsules by mouth daily.       . Cream Base (PCCA LIPODERM BASE) CREA Apply 10 mg topically daily. 10 mg daily rubbed into hairless area on body, hips      . dexamethasone (DECADRON) 4 MG tablet Take 1 tablet (4 mg total) by mouth 2 (two) times daily with a meal. Begin taking this one day after chemotherapy and take for 4 days for nausea  20 tablet  3  . dicyclomine (BENTYL) 10 MG capsule Take 10 mg by mouth as needed (bladder spasm).       Marland Kitchen diltiazem (DILACOR XR) 240 MG 24 hr capsule Take 1 capsule (240 mg total) by mouth every morning.  30 capsule  3  . fish oil-omega-3 fatty acids 1000 MG capsule Take 2 g by mouth daily.      Marland Kitchen Hyaluronic Acid-Vitamin C (HYALURONIC ACID PO) Take 4 tablets by mouth daily.       Marland Kitchen LORazepam (ATIVAN) 1 MG tablet Take 1 tablet (1 mg total) by mouth every 8 (eight) hours. A needed for nausea or anxiety  30 tablet  2  . Multiple Vitamin (MULTIVITAMIN WITH MINERALS) TABS Take 4 tablets by mouth daily.       . ondansetron (ZOFRAN)  8 MG tablet Take 8 mg by mouth 2 (two) times daily. Take as needed for Nausea or Vomiting.      . Pancrelipase, Lip-Prot-Amyl, 24000 UNITS CPEP Take 1 capsule (24,000 Units total) by mouth 4 (four) times daily. Take 1 prior to each meal  180 capsule  6  . prochlorperazine (COMPAZINE) 10 MG tablet Take 1 tablet (10 mg total) by mouth every 6 (six) hours as needed. For nausea  30 tablet  3  . Pyridoxine HCl (VITAMIN B-6 CR PO) Take by mouth 2 (two) times daily.      . tadalafil (CIALIS) 20 MG tablet Take 20 mg by mouth daily as needed.       No facility-administered medications prior to visit.   No Known Allergies Patient Active Problem List   Diagnosis Date Noted  .  Pancreatic cancer 06/02/2012  . LLQ pain 05/29/2012  . Polycythemia, secondary 05/29/2012  . Personal history of colonic adenomas 04/29/2012  . Cellulitis and abscess of trunk   . LOW BACK PAIN   . ED (erectile dysfunction)   . Asthma   . A-fib 03/24/2012  . HTN (hypertension) 03/24/2012   family history includes Coronary artery disease in his father; Diabetes in his father; Hyperlipidemia in his father; Kidney disease in his father; Liver disease in his father; and Stroke in his father.  There is no history of Colon cancer, and Esophageal cancer, and Rectal cancer, and Stomach cancer, .     History  Substance Use Topics  . Smoking status: Never Smoker   . Smokeless tobacco: Never Used  . Alcohol Use: No     Comment: occasional    Objective:   Physical Exam well-developed white male in no acute distress blood pressure 110/70 pulse 80 height 6 foot 1 weight 209. HEENT; nontraumatic normocephalic EOMI PERRLA sclera are icteric, Neck; supple no JVD, Cardiovascular; regular rate and rhythm with S1-S2 no murmur or gallop, Pulmonary; clear bilaterally, Abdomen; soft basically nontender there is no palpable mass or hepatosplenomegaly bowel sounds are active, Extremities ;no clubbing cyanosis or edema his skin appears mildly jaundiced. Psych; mood and affect normal and appropriate        Assessment & Plan:  #15 57 year old white male with metastatic pancreatic cancer now presenting with malignant biliary obstruction and new onset of jaundice. Patient has completed 2 cycles of chemotherapy and had undergone repeat CT scan for restaging earlier this week and was noted to have intra-and extrahepatic biliary ductal dilation, distention of the gallbladder and slight increase in size and the pancreatic mass.  #2 history of atrial fibrillation #3 hypertension #4 history of adenomatous colon polyps  Plan; patient is scheduled for ERCP with metal stent placement with Dr. [name] tomorrow 09/17/2012  in the OR a [facility]. Procedure was discussed in detail with the patient and he is agreeable to proceed..Risks and benefits were discussed including 5-6% risk of pancreatitis Patient is scheduled to undergo another cycle of chemotherapy on 09/18/2012-will notify Dr.Ennever as this may need to be delayed.

## 2012-09-17 NOTE — Anesthesia Postprocedure Evaluation (Signed)
Anesthesia Post Note  Patient: Austin Escobar  Procedure(s) Performed: Procedure(s) (LRB): ENDOSCOPIC RETROGRADE CHOLANGIOPANCREATOGRAPHY (ERCP) (N/A)  Anesthesia type: General  Patient location: PACU  Post pain: Pain level controlled  Post assessment: Post-op Vital signs reviewed  Last Vitals:  Filed Vitals:   09/17/12 1556  BP: 107/65  Pulse: 61  Temp: 36.4 C  Resp: 14    Post vital signs: Reviewed  Level of consciousness: sedated  Complications: No apparent anesthesia complications

## 2012-09-17 NOTE — Anesthesia Preprocedure Evaluation (Signed)
Anesthesia Evaluation  Patient identified by MRN, date of birth, ID band Patient awake    Reviewed: Allergy & Precautions, H&P , NPO status , Patient's Chart, lab work & pertinent test results  Airway Mallampati: II TM Distance: >3 FB Neck ROM: Full    Dental  (+) Teeth Intact and Dental Advisory Given   Pulmonary asthma ,  breath sounds clear to auscultation  Pulmonary exam normal       Cardiovascular hypertension, Pt. on medications + dysrhythmias Atrial Fibrillation Rhythm:Regular Rate:Normal     Neuro/Psych negative neurological ROS  negative psych ROS   GI/Hepatic negative GI ROS, Neg liver ROS,   Endo/Other  negative endocrine ROSHx of pancreatic CA  Renal/GU negative Renal ROS  negative genitourinary   Musculoskeletal negative musculoskeletal ROS (+)   Abdominal   Peds negative pediatric ROS (+)  Hematology negative hematology ROS (+)   Anesthesia Other Findings   Reproductive/Obstetrics                           Anesthesia Physical Anesthesia Plan  ASA: II  Anesthesia Plan: General   Post-op Pain Management:    Induction: Intravenous  Airway Management Planned: Oral ETT  Additional Equipment:   Intra-op Plan:   Post-operative Plan: Extubation in OR  Informed Consent: I have reviewed the patients History and Physical, chart, labs and discussed the procedure including the risks, benefits and alternatives for the proposed anesthesia with the patient or authorized representative who has indicated his/her understanding and acceptance.   Dental advisory given  Plan Discussed with: CRNA  Anesthesia Plan Comments:         Anesthesia Quick Evaluation

## 2012-09-17 NOTE — Preoperative (Signed)
Beta Blockers   Reason not to administer Beta Blockers:Not Applicable 

## 2012-09-17 NOTE — Telephone Encounter (Signed)
Per in basket left message with 7-18 cx and 7-25 appointment and to get schedule

## 2012-09-17 NOTE — Transfer of Care (Signed)
Immediate Anesthesia Transfer of Care Note  Patient: Austin Escobar  Procedure(s) Performed: Procedure(s) (LRB): ENDOSCOPIC RETROGRADE CHOLANGIOPANCREATOGRAPHY (ERCP) (N/A)  Patient Location: PACU  Anesthesia Type: General  Level of Consciousness: sedated, patient cooperative and responds to stimulaton  Airway & Oxygen Therapy: Patient Spontanous Breathing and Patient connected to face mask oxgen  Post-op Assessment: Report given to PACU RN and Post -op Vital signs reviewed and stable  Post vital signs: Reviewed and stable  Complications: No apparent anesthesia complications

## 2012-09-18 ENCOUNTER — Other Ambulatory Visit: Payer: BC Managed Care – PPO | Admitting: Lab

## 2012-09-18 ENCOUNTER — Encounter (HOSPITAL_COMMUNITY): Payer: Self-pay | Admitting: Gastroenterology

## 2012-09-18 ENCOUNTER — Ambulatory Visit: Payer: BC Managed Care – PPO

## 2012-09-18 NOTE — Progress Notes (Signed)
Reviewed and agree with management. Decari Duggar D. Zakeya Junker, M.D., FACG  

## 2012-09-21 ENCOUNTER — Telehealth: Payer: Self-pay | Admitting: *Deleted

## 2012-09-21 DIAGNOSIS — C259 Malignant neoplasm of pancreas, unspecified: Secondary | ICD-10-CM

## 2012-09-21 DIAGNOSIS — K831 Obstruction of bile duct: Secondary | ICD-10-CM

## 2012-09-21 NOTE — Telephone Encounter (Signed)
Per ERCP on 09/17/12, pt needs repeat lft's in 1 week and OV with Dr [name] in 2-3 weeks.

## 2012-09-22 ENCOUNTER — Ambulatory Visit (HOSPITAL_COMMUNITY)
Admission: RE | Admit: 2012-09-22 | Payer: BC Managed Care – PPO | Source: Ambulatory Visit | Admitting: Internal Medicine

## 2012-09-22 ENCOUNTER — Encounter (HOSPITAL_COMMUNITY): Admission: RE | Payer: Self-pay | Source: Ambulatory Visit

## 2012-09-22 SURGERY — ERCP, WITH INTERVENTION IF INDICATED
Anesthesia: Monitor Anesthesia Care

## 2012-09-22 NOTE — Telephone Encounter (Signed)
Spoke with Austin Escobar and he will have labs done at the [facility] on 09/25/12. He will f/u with Dr [name] on 09/29/12 at 10:15am. Letter mailed to Austin Escobar

## 2012-09-25 ENCOUNTER — Ambulatory Visit: Payer: BC Managed Care – PPO

## 2012-09-25 ENCOUNTER — Ambulatory Visit (HOSPITAL_BASED_OUTPATIENT_CLINIC_OR_DEPARTMENT_OTHER): Payer: BC Managed Care – PPO

## 2012-09-25 ENCOUNTER — Other Ambulatory Visit: Payer: BC Managed Care – PPO | Admitting: Lab

## 2012-09-25 ENCOUNTER — Ambulatory Visit (HOSPITAL_BASED_OUTPATIENT_CLINIC_OR_DEPARTMENT_OTHER): Payer: BC Managed Care – PPO | Admitting: Hematology & Oncology

## 2012-09-25 ENCOUNTER — Telehealth: Payer: Self-pay | Admitting: *Deleted

## 2012-09-25 ENCOUNTER — Other Ambulatory Visit (HOSPITAL_BASED_OUTPATIENT_CLINIC_OR_DEPARTMENT_OTHER): Payer: BC Managed Care – PPO | Admitting: Lab

## 2012-09-25 ENCOUNTER — Telehealth: Payer: Self-pay

## 2012-09-25 VITALS — BP 113/68 | HR 79 | Temp 97.7°F | Resp 18 | Ht 72.0 in | Wt 199.0 lb

## 2012-09-25 DIAGNOSIS — C250 Malignant neoplasm of head of pancreas: Secondary | ICD-10-CM

## 2012-09-25 DIAGNOSIS — C778 Secondary and unspecified malignant neoplasm of lymph nodes of multiple regions: Secondary | ICD-10-CM

## 2012-09-25 DIAGNOSIS — C259 Malignant neoplasm of pancreas, unspecified: Secondary | ICD-10-CM

## 2012-09-25 DIAGNOSIS — Z5111 Encounter for antineoplastic chemotherapy: Secondary | ICD-10-CM

## 2012-09-25 LAB — CBC WITH DIFFERENTIAL (CANCER CENTER ONLY)
BASO#: 0 10*3/uL (ref 0.0–0.2)
BASO%: 0.3 % (ref 0.0–2.0)
Eosinophils Absolute: 0.2 10*3/uL (ref 0.0–0.5)
HCT: 41.2 % (ref 38.7–49.9)
HGB: 13.9 g/dL (ref 13.0–17.1)
LYMPH#: 0.9 10*3/uL (ref 0.9–3.3)
MONO#: 0.8 10*3/uL (ref 0.1–0.9)
NEUT%: 77.3 % (ref 40.0–80.0)
RBC: 4.16 10*6/uL — ABNORMAL LOW (ref 4.20–5.70)
WBC: 8.7 10*3/uL (ref 4.0–10.0)

## 2012-09-25 LAB — CMP (CANCER CENTER ONLY)
ALT(SGPT): 72 U/L — ABNORMAL HIGH (ref 10–47)
BUN, Bld: 14 mg/dL (ref 7–22)
CO2: 26 mEq/L (ref 18–33)
Calcium: 9.1 mg/dL (ref 8.0–10.3)
Chloride: 108 mEq/L (ref 98–108)
Creat: 0.8 mg/dl (ref 0.6–1.2)

## 2012-09-25 MED ORDER — SODIUM CHLORIDE 0.9 % IV SOLN
Freq: Once | INTRAVENOUS | Status: AC
  Administered 2012-09-25: 09:00:00 via INTRAVENOUS

## 2012-09-25 MED ORDER — ONDANSETRON 8 MG/50ML IVPB (CHCC)
8.0000 mg | Freq: Once | INTRAVENOUS | Status: AC
  Administered 2012-09-25: 8 mg via INTRAVENOUS

## 2012-09-25 MED ORDER — SODIUM CHLORIDE 0.9 % IV SOLN
800.0000 mg/m2 | Freq: Once | INTRAVENOUS | Status: AC
  Administered 2012-09-25: 1824 mg via INTRAVENOUS
  Filled 2012-09-25: qty 48

## 2012-09-25 MED ORDER — DEXAMETHASONE SODIUM PHOSPHATE 10 MG/ML IJ SOLN
10.0000 mg | Freq: Once | INTRAMUSCULAR | Status: AC
  Administered 2012-09-25: 10 mg via INTRAVENOUS

## 2012-09-25 MED ORDER — PACLITAXEL PROTEIN-BOUND CHEMO INJECTION 100 MG
125.0000 mg/m2 | Freq: Once | INTRAVENOUS | Status: AC
  Administered 2012-09-25: 275 mg via INTRAVENOUS
  Filled 2012-09-25: qty 55

## 2012-09-25 NOTE — Patient Instructions (Addendum)
Gemcitabine injection  What is this medicine?  GEMCITABINE (jem SIT a been) is a chemotherapy drug. This medicine is used to treat many types of cancer like breast cancer, lung cancer, pancreatic cancer, and ovarian cancer.  This medicine may be used for other purposes; ask your health care provider or pharmacist if you have questions.  What should I tell my health care provider before I take this medicine?  They need to know if you have any of these conditions:  -blood disorders  -infection  -kidney disease  -liver disease  -recent or ongoing radiation therapy  -an unusual or allergic reaction to gemcitabine, other chemotherapy, other medicines, foods, dyes, or preservatives  -pregnant or trying to get pregnant  -breast-feeding  How should I use this medicine?  This drug is given as an infusion into a vein. It is administered in a hospital or clinic by a specially trained health care professional.  Talk to your pediatrician regarding the use of this medicine in children. Special care may be needed.  Overdosage: If you think you have taken too much of this medicine contact a poison control center or emergency room at once.  NOTE: This medicine is only for you. Do not share this medicine with others.  What if I miss a dose?  It is important not to miss your dose. Call your doctor or health care professional if you are unable to keep an appointment.  What may interact with this medicine?  -medicines to increase blood counts like filgrastim, pegfilgrastim, sargramostim  -some other chemotherapy drugs like cisplatin  -vaccines  Talk to your doctor or health care professional before taking any of these medicines:  -acetaminophen  -aspirin  -ibuprofen  -ketoprofen  -naproxen  This list may not describe all possible interactions. Give your health care provider a list of all the medicines, herbs, non-prescription drugs, or dietary supplements you use. Also tell them if you smoke, drink alcohol, or use illegal drugs. Some  items may interact with your medicine.  What should I watch for while using this medicine?  Visit your doctor for checks on your progress. This drug may make you feel generally unwell. This is not uncommon, as chemotherapy can affect healthy cells as well as cancer cells. Report any side effects. Continue your course of treatment even though you feel ill unless your doctor tells you to stop.  In some cases, you may be given additional medicines to help with side effects. Follow all directions for their use.  Call your doctor or health care professional for advice if you get a fever, chills or sore throat, or other symptoms of a cold or flu. Do not treat yourself. This drug decreases your body's ability to fight infections. Try to avoid being around people who are sick.  This medicine may increase your risk to bruise or bleed. Call your doctor or health care professional if you notice any unusual bleeding.  Be careful brushing and flossing your teeth or using a toothpick because you may get an infection or bleed more easily. If you have any dental work done, tell your dentist you are receiving this medicine.  Avoid taking products that contain aspirin, acetaminophen, ibuprofen, naproxen, or ketoprofen unless instructed by your doctor. These medicines may hide a fever.  Women should inform their doctor if they wish to become pregnant or think they might be pregnant. There is a potential for serious side effects to an unborn child. Talk to your health care professional or pharmacist for   more information. Do not breast-feed an infant while taking this medicine.  What side effects may I notice from receiving this medicine?  Side effects that you should report to your doctor or health care professional as soon as possible:  -allergic reactions like skin rash, itching or hives, swelling of the face, lips, or tongue  -low blood counts - this medicine may decrease the number of white blood cells, red blood cells and  platelets. You may be at increased risk for infections and bleeding.  -signs of infection - fever or chills, cough, sore throat, pain or difficulty passing urine  -signs of decreased platelets or bleeding - bruising, pinpoint red spots on the skin, black, tarry stools, blood in the urine  -signs of decreased red blood cells - unusually weak or tired, fainting spells, lightheadedness  -breathing problems  -chest pain  -mouth sores  -nausea and vomiting  -pain, swelling, redness at site where injected  -pain, tingling, numbness in the hands or feet  -stomach pain  -swelling of ankles, feet, hands  -unusual bleeding  Side effects that usually do not require medical attention (report to your doctor or health care professional if they continue or are bothersome):  -constipation  -diarrhea  -hair loss  -loss of appetite  -stomach upset  This list may not describe all possible side effects. Call your doctor for medical advice about side effects. You may report side effects to FDA at 1-800-FDA-1088.  Where should I keep my medicine?  This drug is given in a hospital or clinic and will not be stored at home.  NOTE: This sheet is a summary. It may not cover all possible information. If you have questions about this medicine, talk to your doctor, pharmacist, or health care provider.   2013, [employer]. (06/30/2007 6:45:54 PM)  Nanoparticle Albumin-Bound Paclitaxel injection  What is this medicine?  NANOPARTICLE ALBUMIN-BOUND PACLITAXEL (Na no PAHR ti kuhl al BYOO muhn-bound PAK li TAX el) is a chemotherapy drug. It targets fast dividing cells, like cancer cells, and causes these cells to die. This medicine is used to treat advanced breast cancer and advanced lung cancer.  This medicine may be used for other purposes; ask your health care provider or pharmacist if you have questions.  What should I tell my health care provider before I take this medicine?  They need to know if you have any of these  conditions:  -kidney disease  -liver disease  -low blood counts, like low platelets, red blood cells, or white blood cells  -recent or ongoing radiation therapy  -an unusual or allergic reaction to paclitaxel, albumin, other chemotherapy, other medicines, foods, dyes, or preservatives  -pregnant or trying to get pregnant  -breast-feeding  How should I use this medicine?  This drug is given as an infusion into a vein. It is administered in a hospital or clinic by a specially trained health care professional.  Talk to your pediatrician regarding the use of this medicine in children. Special care may be needed.  Overdosage: If you think you have taken too much of this medicine contact a poison control center or emergency room at once.  NOTE: This medicine is only for you. Do not share this medicine with others.  What if I miss a dose?  It is important not to miss your dose. Call your doctor or health care professional if you are unable to keep an appointment.  What may interact with this medicine?  This medicine may also interact with   the following medications:  -cyclosporine  -dexamethasone  -diazepam  -ketoconazole  -medicines to increase blood counts like filgrastim, pegfilgrastim, sargramostim  -other chemotherapy drugs like cisplatin, doxorubicin, epirubicin, etoposide, teniposide, vincristine  -quinidine  -testosterone  -vaccines  -verapamil  Talk to your doctor or health care professional before taking any of these medicines:  -acetaminophen  -aspirin  -ibuprofen  -ketoprofen  -naproxen  This list may not describe all possible interactions. Give your health care provider a list of all the medicines, herbs, non-prescription drugs, or dietary supplements you use. Also tell them if you smoke, drink alcohol, or use illegal drugs. Some items may interact with your medicine.  What should I watch for while using this medicine?  Your condition will be monitored carefully while you are receiving this medicine. You will  need important blood work done while you are taking this medicine.  This drug may make you feel generally unwell. This is not uncommon, as chemotherapy can affect healthy cells as well as cancer cells. Report any side effects. Continue your course of treatment even though you feel ill unless your doctor tells you to stop.  In some cases, you may be given additional medicines to help with side effects. Follow all directions for their use.  Call your doctor or health care professional for advice if you get a fever, chills or sore throat, or other symptoms of a cold or flu. Do not treat yourself. This drug decreases your body's ability to fight infections. Try to avoid being around people who are sick.  This medicine may increase your risk to bruise or bleed. Call your doctor or health care professional if you notice any unusual bleeding.  Be careful brushing and flossing your teeth or using a toothpick because you may get an infection or bleed more easily. If you have any dental work done, tell your dentist you are receiving this medicine.  Avoid taking products that contain aspirin, acetaminophen, ibuprofen, naproxen, or ketoprofen unless instructed by your doctor. These medicines may hide a fever.  Do not become pregnant while taking this medicine. Women should inform their doctor if they wish to become pregnant or think they might be pregnant. There is a potential for serious side effects to an unborn child. Talk to your health care professional or pharmacist for more information. Do not breast-feed an infant while taking this medicine.  Men are advised not to father a child while receiving this medicine.  What side effects may I notice from receiving this medicine?  Side effects that you should report to your doctor or health care professional as soon as possible:  -allergic reactions like skin rash, itching or hives, swelling of the face, lips, or tongue  -low blood counts - This drug may decrease the number of  white blood cells, red blood cells and platelets. You may be at increased risk for infections and bleeding.  -signs of infection - fever or chills, cough, sore throat, pain or difficulty passing urine  -signs of decreased platelets or bleeding - bruising, pinpoint red spots on the skin, black, tarry stools, nosebleeds  -signs of decreased red blood cells - unusually weak or tired, fainting spells, lightheadedness  -breathing problems  -changes in vision  -chest pain  -high or low blood pressure  -mouth sores  -nausea and vomiting  -pain, swelling, redness or irritation at the injection site  -pain, tingling, numbness in the hands or feet  -slow or irregular heartbeat  -swelling of the ankle, feet, hands

## 2012-09-25 NOTE — Progress Notes (Signed)
This office note has been dictated.

## 2012-09-25 NOTE — Telephone Encounter (Signed)
Message copied by Annett Fabian on Fri Sep 25, 2012  4:43 PM ------      Message from: Iva Boop      Created: Fri Sep 25, 2012  2:35 PM       I called him and he feels ok      We can cancel the appointment      ----- Message -----         From: Rossie Muskrat, RN         Sent: 09/25/2012   1:25 PM           To: Iva Boop, MD            Labs look much better.  Do you want me to try and feel out if he would like to cancel?      ----- Message -----         From: Rossie Muskrat, RN         Sent: 09/23/2012   5:00 PM           To: Rossie Muskrat, RN            Look for labs on Friday, may not need appt on Tuesday with [name]             ------

## 2012-09-25 NOTE — Telephone Encounter (Signed)
Appointment for 09/29/12 canceled

## 2012-09-25 NOTE — Telephone Encounter (Signed)
Dr [name], I routed labs from today to ou. Pt has a f/u with you on 09/29/12. Thanks.

## 2012-09-25 NOTE — Telephone Encounter (Signed)
Message copied by Florene Glen on Fri Sep 25, 2012  9:36 AM ------      Message from: Florene Glen      Created: Tue Sep 22, 2012  9:07 AM       Look for labs from [facility] and forward to Dr [name], ------

## 2012-09-26 NOTE — Progress Notes (Signed)
CC:   Austin Escobar. Austin Pais, DO  DIAGNOSIS:  Metastatic pancreatic cancer.  CURRENT THERAPY:  Patient status post 2 cycles of Gemzar/Abraxane.  INTERIM HISTORY:  Austin Escobar comes in for his followup.  He actually required stenting of his biliary duct.  When we did a CT scan, it did show that he had a response with lymphadenopathy.  However, the primary pancreatic mass was minimally enlarged.  However, it was proximal to the biliary duct and was causing a stricture.  On July 17th, he underwent ERCP with bile stent placement.  This was very effective.  He is feeling well.  He has had no problems pain wise.  There was a little bit of discomfort after the procedure.  His last CA-19-9 was 2800.  His appetite is good.  He still has some loose stools but not as bad.  He has not noted any cough or shortness of breath.  He has had no change in bowel or bladder habits.  He has had no leg swelling.  Overall, his performance status is ECOG 1.  PHYSICAL EXAMINATION:  General:  This is a well-developed, well- nourished white gentleman in no obvious distress.  Vital Signs:  Show a temperature of 97.7, pulse 79, respiratory rate 18, blood pressure 113/68.  Weight is 199.  Head and Neck:  Shows a normocephalic, atraumatic skull.  There are no ocular or oral lesions.  There are no palpable cervical or supraclavicular lymph nodes.  Lungs:  Clear bilaterally.  Cardiac:  Regular rate and rhythm with a normal S1 and S2. There are no murmurs, rubs, or bruits.  Abdomen:  Soft.  He has good bowel sounds.  There is no guarding or rebound tenderness.  There is no palpable hepatosplenomegaly.  Extremities:  Show no clubbing, cyanosis, or edema.  Neurological:  Shows no focal neurological deficits.  LABORATORY STUDIES:  White cell count is 8.7.  Hemoglobin 13.9, hematocrit 44.2.  Platelet count is 235.  IMPRESSION:  Austin Escobar is a 57 year old gentleman with metastatic pancreatic cancer.  Again, there is a  response by CT scan.  As such, I think that we need to continue with the Gemzar and Abraxane.  He has tolerated it well.  I am checking his liver tests today.  He certainly does not look jaundiced, though.  We should be seeing some normalization of his liver function.  I think that we are going to have to only give him day 1 and day 8 of treatment.  We will have to make his cycles 21 days now.  I just do not think his bone marrow can handle day 15 of each cycle of treatment.  We will plan for 2 more cycles of therapy and then reassess with our scans.  Hopefully, we can do a PET scan, but again his insurance company is really being strict about this.    ______________________________ Austin Escobar, M.D. PRE/MEDQ  D:  09/25/2012  T:  09/26/2012  Job:  [id number]

## 2012-09-29 ENCOUNTER — Ambulatory Visit: Payer: BC Managed Care – PPO | Admitting: Internal Medicine

## 2012-10-01 ENCOUNTER — Other Ambulatory Visit: Payer: Self-pay | Admitting: *Deleted

## 2012-10-01 MED ORDER — DILTIAZEM HCL ER 240 MG PO CP24: 240.0000 mg | ORAL_CAPSULE | Freq: Every morning | ORAL | Status: DC

## 2012-10-02 ENCOUNTER — Other Ambulatory Visit (HOSPITAL_BASED_OUTPATIENT_CLINIC_OR_DEPARTMENT_OTHER): Payer: BC Managed Care – PPO | Admitting: Lab

## 2012-10-02 ENCOUNTER — Ambulatory Visit (HOSPITAL_BASED_OUTPATIENT_CLINIC_OR_DEPARTMENT_OTHER): Payer: BC Managed Care – PPO

## 2012-10-02 VITALS — BP 113/71 | HR 79 | Temp 98.7°F | Resp 20 | Wt 201.0 lb

## 2012-10-02 DIAGNOSIS — C250 Malignant neoplasm of head of pancreas: Secondary | ICD-10-CM

## 2012-10-02 DIAGNOSIS — C778 Secondary and unspecified malignant neoplasm of lymph nodes of multiple regions: Secondary | ICD-10-CM

## 2012-10-02 DIAGNOSIS — Z5111 Encounter for antineoplastic chemotherapy: Secondary | ICD-10-CM

## 2012-10-02 DIAGNOSIS — C259 Malignant neoplasm of pancreas, unspecified: Secondary | ICD-10-CM

## 2012-10-02 LAB — CBC WITH DIFFERENTIAL (CANCER CENTER ONLY)
Eosinophils Absolute: 0.1 10*3/uL (ref 0.0–0.5)
HCT: 35.9 % — ABNORMAL LOW (ref 38.7–49.9)
LYMPH%: 28.7 % (ref 14.0–48.0)
MCV: 100 fL — ABNORMAL HIGH (ref 82–98)
MONO#: 0.4 10*3/uL (ref 0.1–0.9)
NEUT%: 56.7 % (ref 40.0–80.0)
RBC: 3.59 10*6/uL — ABNORMAL LOW (ref 4.20–5.70)
WBC: 3.4 10*3/uL — ABNORMAL LOW (ref 4.0–10.0)

## 2012-10-02 MED ORDER — DEXAMETHASONE SODIUM PHOSPHATE 10 MG/ML IJ SOLN
10.0000 mg | Freq: Once | INTRAMUSCULAR | Status: AC
  Administered 2012-10-02: 10 mg via INTRAVENOUS

## 2012-10-02 MED ORDER — PACLITAXEL PROTEIN-BOUND CHEMO INJECTION 100 MG
125.0000 mg/m2 | Freq: Once | INTRAVENOUS | Status: AC
  Administered 2012-10-02: 275 mg via INTRAVENOUS
  Filled 2012-10-02: qty 55

## 2012-10-02 MED ORDER — SODIUM CHLORIDE 0.9 % IV SOLN
Freq: Once | INTRAVENOUS | Status: AC
  Administered 2012-10-02: 13:00:00 via INTRAVENOUS

## 2012-10-02 MED ORDER — SODIUM CHLORIDE 0.9 % IV SOLN
800.0000 mg/m2 | Freq: Once | INTRAVENOUS | Status: AC
  Administered 2012-10-02: 1824 mg via INTRAVENOUS
  Filled 2012-10-02: qty 48

## 2012-10-02 MED ORDER — ONDANSETRON 8 MG/50ML IVPB (CHCC)
8.0000 mg | Freq: Once | INTRAVENOUS | Status: AC
  Administered 2012-10-02: 8 mg via INTRAVENOUS

## 2012-10-02 NOTE — Patient Instructions (Addendum)
Gemcitabine injection  What is this medicine?  GEMCITABINE (jem SIT a been) is a chemotherapy drug. This medicine is used to treat many types of cancer like breast cancer, lung cancer, pancreatic cancer, and ovarian cancer.  This medicine may be used for other purposes; ask your health care provider or pharmacist if you have questions.  What should I tell my health care provider before I take this medicine?  They need to know if you have any of these conditions:  -blood disorders  -infection  -kidney disease  -liver disease  -recent or ongoing radiation therapy  -an unusual or allergic reaction to gemcitabine, other chemotherapy, other medicines, foods, dyes, or preservatives  -pregnant or trying to get pregnant  -breast-feeding  How should I use this medicine?  This drug is given as an infusion into a vein. It is administered in a hospital or clinic by a specially trained health care professional.  Talk to your pediatrician regarding the use of this medicine in children. Special care may be needed.  Overdosage: If you think you have taken too much of this medicine contact a poison control center or emergency room at once.  NOTE: This medicine is only for you. Do not share this medicine with others.  What if I miss a dose?  It is important not to miss your dose. Call your doctor or health care professional if you are unable to keep an appointment.  What may interact with this medicine?  -medicines to increase blood counts like filgrastim, pegfilgrastim, sargramostim  -some other chemotherapy drugs like cisplatin  -vaccines  Talk to your doctor or health care professional before taking any of these medicines:  -acetaminophen  -aspirin  -ibuprofen  -ketoprofen  -naproxen  This list may not describe all possible interactions. Give your health care provider a list of all the medicines, herbs, non-prescription drugs, or dietary supplements you use. Also tell them if you smoke, drink alcohol, or use illegal drugs. Some  items may interact with your medicine.  What should I watch for while using this medicine?  Visit your doctor for checks on your progress. This drug may make you feel generally unwell. This is not uncommon, as chemotherapy can affect healthy cells as well as cancer cells. Report any side effects. Continue your course of treatment even though you feel ill unless your doctor tells you to stop.  In some cases, you may be given additional medicines to help with side effects. Follow all directions for their use.  Call your doctor or health care professional for advice if you get a fever, chills or sore throat, or other symptoms of a cold or flu. Do not treat yourself. This drug decreases your body's ability to fight infections. Try to avoid being around people who are sick.  This medicine may increase your risk to bruise or bleed. Call your doctor or health care professional if you notice any unusual bleeding.  Be careful brushing and flossing your teeth or using a toothpick because you may get an infection or bleed more easily. If you have any dental work done, tell your dentist you are receiving this medicine.  Avoid taking products that contain aspirin, acetaminophen, ibuprofen, naproxen, or ketoprofen unless instructed by your doctor. These medicines may hide a fever.  Women should inform their doctor if they wish to become pregnant or think they might be pregnant. There is a potential for serious side effects to an unborn child. Talk to your health care professional or pharmacist for   reactions like skin rash, itching or hives, swelling of the face, lips, or tongue -low blood counts - this medicine may decrease the number of white blood cells, red blood cells and  platelets. You may be at increased risk for infections and bleeding. -signs of infection - fever or chills, cough, sore throat, pain or difficulty passing urine -signs of decreased platelets or bleeding - bruising, pinpoint red spots on the skin, black, tarry stools, blood in the urine -signs of decreased red blood cells - unusually weak or tired, fainting spells, lightheadedness -breathing problems -chest pain -mouth sores -nausea and vomiting -pain, swelling, redness at site where injected -pain, tingling, numbness in the hands or feet -stomach pain -swelling of ankles, feet, hands -unusual bleeding Side effects that usually do not require medical attention (report to your doctor or health care professional if they continue or are bothersome): -constipation -diarrhea -hair loss -loss of appetite -stomach upset This list may not describe all possible side effects. Call your doctor for medical advice about side effects. You may report side effects to FDA at 1-800-FDA-1088. Where should I keep my medicine? This drug is given in a hospital or clinic and will not be stored at home. NOTE: This sheet is a summary. It may not cover all possible information. If you have questions about this medicine, talk to your doctor, pharmacist, or health care provider.  2013, [employer]. (06/30/2007 6:45:54 PM)  Nanoparticle Albumin-Bound Paclitaxel injection What is this medicine? NANOPARTICLE ALBUMIN-BOUND PACLITAXEL (Na no PAHR ti kuhl al BYOO muhn-bound PAK li TAX el) is a chemotherapy drug. It targets fast dividing cells, like cancer cells, and causes these cells to die. This medicine is used to treat advanced breast cancer and advanced lung cancer. This medicine may be used for other purposes; ask your health care provider or pharmacist if you have questions. What should I tell my health care provider before I take this medicine? They need to know if you have any of these  conditions: -kidney disease -liver disease -low blood counts, like low platelets, red blood cells, or white blood cells -recent or ongoing radiation therapy -an unusual or allergic reaction to paclitaxel, albumin, other chemotherapy, other medicines, foods, dyes, or preservatives -pregnant or trying to get pregnant -breast-feeding How should I use this medicine? This drug is given as an infusion into a vein. It is administered in a hospital or clinic by a specially trained health care professional. Talk to your pediatrician regarding the use of this medicine in children. Special care may be needed. Overdosage: If you think you have taken too much of this medicine contact a poison control center or emergency room at once. NOTE: This medicine is only for you. Do not share this medicine with others. What if I miss a dose? It is important not to miss your dose. Call your doctor or health care professional if you are unable to keep an appointment. What may interact with this medicine? This medicine may also interact with the following medications: -cyclosporine -dexamethasone -diazepam -ketoconazole -medicines to increase blood counts like filgrastim, pegfilgrastim, sargramostim -other chemotherapy drugs like cisplatin, doxorubicin, epirubicin, etoposide, teniposide, vincristine -quinidine -testosterone -vaccines -verapamil Talk to your doctor or health care professional before taking any of these medicines: -acetaminophen -aspirin -ibuprofen -ketoprofen -naproxen This list may not describe all possible interactions. Give your health care provider a list of all the medicines, herbs, non-prescription drugs, or dietary supplements you use. Also tell them if you smoke, drink alcohol, or use illegal  drugs. Some items may interact with your medicine. What should I watch for while using this medicine? Your condition will be monitored carefully while you are receiving this medicine. You will  need important blood work done while you are taking this medicine. This drug may make you feel generally unwell. This is not uncommon, as chemotherapy can affect healthy cells as well as cancer cells. Report any side effects. Continue your course of treatment even though you feel ill unless your doctor tells you to stop. In some cases, you may be given additional medicines to help with side effects. Follow all directions for their use. Call your doctor or health care professional for advice if you get a fever, chills or sore throat, or other symptoms of a cold or flu. Do not treat yourself. This drug decreases your body's ability to fight infections. Try to avoid being around people who are sick. This medicine may increase your risk to bruise or bleed. Call your doctor or health care professional if you notice any unusual bleeding. Be careful brushing and flossing your teeth or using a toothpick because you may get an infection or bleed more easily. If you have any dental work done, tell your dentist you are receiving this medicine. Avoid taking products that contain aspirin, acetaminophen, ibuprofen, naproxen, or ketoprofen unless instructed by your doctor. These medicines may hide a fever. Do not become pregnant while taking this medicine. Women should inform their doctor if they wish to become pregnant or think they might be pregnant. There is a potential for serious side effects to an unborn child. Talk to your health care professional or pharmacist for more information. Do not breast-feed an infant while taking this medicine. Men are advised not to father a child while receiving this medicine. What side effects may I notice from receiving this medicine? Side effects that you should report to your doctor or health care professional as soon as possible: -allergic reactions like skin rash, itching or hives, swelling of the face, lips, or tongue -low blood counts - This drug may decrease the number of  white blood cells, red blood cells and platelets. You may be at increased risk for infections and bleeding. -signs of infection - fever or chills, cough, sore throat, pain or difficulty passing urine -signs of decreased platelets or bleeding - bruising, pinpoint red spots on the skin, black, tarry stools, nosebleeds -signs of decreased red blood cells - unusually weak or tired, fainting spells, lightheadedness -breathing problems -changes in vision -chest pain -high or low blood pressure -mouth sores -nausea and vomiting -pain, swelling, redness or irritation at the injection site -pain, tingling, numbness in the hands or feet -slow or irregular heartbeat -swelling of the ankle, feet, hands Side effects that usually do not require medical attention (report to your doctor or health care professional if they continue or are bothersome): -aches, pains -changes in the color of fingernails -diarrhea -hair loss -loss of appetite This list may not describe all possible side effects. Call your doctor for medical advice about side effects. You may report side effects to FDA at 1-800-FDA-1088. Where should I keep my medicine? This drug is given in a hospital or clinic and will not be stored at home. NOTE: This sheet is a summary. It may not cover all possible information. If you have questions about this medicine, talk to your doctor, pharmacist, or health care provider.  2013, [employer] Standard. (12/24/2010 4:13:49 PM)

## 2012-10-09 ENCOUNTER — Ambulatory Visit: Payer: BC Managed Care – PPO

## 2012-10-09 ENCOUNTER — Other Ambulatory Visit: Payer: BC Managed Care – PPO | Admitting: Lab

## 2012-10-14 ENCOUNTER — Ambulatory Visit: Payer: BC Managed Care – PPO | Admitting: Internal Medicine

## 2012-10-15 ENCOUNTER — Other Ambulatory Visit: Payer: BC Managed Care – PPO | Admitting: Lab

## 2012-10-15 ENCOUNTER — Ambulatory Visit: Payer: BC Managed Care – PPO | Admitting: Hematology & Oncology

## 2012-10-15 ENCOUNTER — Ambulatory Visit: Payer: BC Managed Care – PPO

## 2012-10-16 ENCOUNTER — Ambulatory Visit: Payer: BC Managed Care – PPO

## 2012-10-16 ENCOUNTER — Other Ambulatory Visit (HOSPITAL_BASED_OUTPATIENT_CLINIC_OR_DEPARTMENT_OTHER): Payer: BC Managed Care – PPO | Admitting: Lab

## 2012-10-16 ENCOUNTER — Ambulatory Visit (HOSPITAL_BASED_OUTPATIENT_CLINIC_OR_DEPARTMENT_OTHER): Payer: BC Managed Care – PPO | Admitting: Hematology & Oncology

## 2012-10-16 ENCOUNTER — Ambulatory Visit (HOSPITAL_BASED_OUTPATIENT_CLINIC_OR_DEPARTMENT_OTHER): Payer: BC Managed Care – PPO

## 2012-10-16 ENCOUNTER — Ambulatory Visit: Payer: BC Managed Care – PPO | Admitting: Hematology & Oncology

## 2012-10-16 VITALS — BP 119/79 | HR 77 | Temp 98.2°F | Resp 16 | Ht 72.0 in | Wt 200.0 lb

## 2012-10-16 DIAGNOSIS — Z5111 Encounter for antineoplastic chemotherapy: Secondary | ICD-10-CM

## 2012-10-16 DIAGNOSIS — C778 Secondary and unspecified malignant neoplasm of lymph nodes of multiple regions: Secondary | ICD-10-CM

## 2012-10-16 DIAGNOSIS — C250 Malignant neoplasm of head of pancreas: Secondary | ICD-10-CM

## 2012-10-16 DIAGNOSIS — C259 Malignant neoplasm of pancreas, unspecified: Secondary | ICD-10-CM

## 2012-10-16 DIAGNOSIS — R197 Diarrhea, unspecified: Secondary | ICD-10-CM

## 2012-10-16 LAB — CMP (CANCER CENTER ONLY)
ALT(SGPT): 21 U/L (ref 10–47)
Albumin: 3.4 g/dL (ref 3.3–5.5)
CO2: 23 mEq/L (ref 18–33)
Calcium: 9 mg/dL (ref 8.0–10.3)
Chloride: 109 mEq/L — ABNORMAL HIGH (ref 98–108)
Sodium: 135 mEq/L (ref 128–145)
Total Protein: 6.7 g/dL (ref 6.4–8.1)

## 2012-10-16 LAB — CBC WITH DIFFERENTIAL (CANCER CENTER ONLY)
BASO%: 0.6 % (ref 0.0–2.0)
HCT: 36.7 % — ABNORMAL LOW (ref 38.7–49.9)
LYMPH#: 0.7 10*3/uL — ABNORMAL LOW (ref 0.9–3.3)
MONO#: 0.6 10*3/uL (ref 0.1–0.9)
NEUT%: 70 % (ref 40.0–80.0)
Platelets: 298 10*3/uL (ref 145–400)
RDW: 14.9 % (ref 11.1–15.7)
WBC: 4.9 10*3/uL (ref 4.0–10.0)

## 2012-10-16 LAB — CANCER ANTIGEN 19-9: CA 19-9: 8947.3 U/mL — ABNORMAL HIGH (ref <35.0)

## 2012-10-16 MED ORDER — SODIUM CHLORIDE 0.9 % IV SOLN
800.0000 mg/m2 | Freq: Once | INTRAVENOUS | Status: AC
  Administered 2012-10-16: 1824 mg via INTRAVENOUS
  Filled 2012-10-16: qty 47.97

## 2012-10-16 MED ORDER — ONDANSETRON 8 MG/50ML IVPB (CHCC)
8.0000 mg | Freq: Once | INTRAVENOUS | Status: AC
  Administered 2012-10-16: 8 mg via INTRAVENOUS

## 2012-10-16 MED ORDER — DEXAMETHASONE SODIUM PHOSPHATE 10 MG/ML IJ SOLN
10.0000 mg | Freq: Once | INTRAMUSCULAR | Status: AC
  Administered 2012-10-16: 10 mg via INTRAVENOUS

## 2012-10-16 MED ORDER — PACLITAXEL PROTEIN-BOUND CHEMO INJECTION 100 MG
125.0000 mg/m2 | Freq: Once | INTRAVENOUS | Status: AC
  Administered 2012-10-16: 275 mg via INTRAVENOUS
  Filled 2012-10-16: qty 55

## 2012-10-16 NOTE — Patient Instructions (Addendum)
[facility] Discharge Instructions for Patients Receiving Chemotherapy  Today you received the following chemotherapy agents Abraxan, Gemzar  To help prevent nausea and vomiting after your treatment, we encourage you to take your nausea medication    If you develop nausea and vomiting that is not controlled by your nausea medication, call the clinic.   BELOW ARE SYMPTOMS THAT SHOULD BE REPORTED IMMEDIATELY:  *FEVER GREATER THAN 100.5 F  *CHILLS WITH OR WITHOUT FEVER  NAUSEA AND VOMITING THAT IS NOT CONTROLLED WITH YOUR NAUSEA MEDICATION  *UNUSUAL SHORTNESS OF BREATH  *UNUSUAL BRUISING OR BLEEDING  TENDERNESS IN MOUTH AND THROAT WITH OR WITHOUT PRESENCE OF ULCERS  *URINARY PROBLEMS  *BOWEL PROBLEMS  UNUSUAL RASH Items with * indicate a potential emergency and should be followed up as soon as possible.  Feel free to call the clinic you have any questions or concerns. The clinic phone number is [phone].

## 2012-10-16 NOTE — Progress Notes (Signed)
This office note has been dictated.

## 2012-10-17 NOTE — Progress Notes (Signed)
DIAGNOSIS:  Metastatic pancreatic cancer.  CURRENT THERAPY:  Patient is status post 3 cycles of Gemzar/Abraxane.  INTERIM HISTORY:  Mr. Bury comes in for his followup.  He is doing quite well.  His biliary stent is working quite nicely.  He has not had any abdominal pain.  He has had no problems with recurrent jaundice.  He is eating well.  His diarrhea seems to be getting better.  He has had no leg swelling.  He has had no rashes.  He has had no cough or shortness of breath.  PHYSICAL EXAM:  General:  This is a well-developed, well-nourished white gentleman in no obvious distress.  Vital Signs:  Show a temperature of 98.2, pulse 77, respiratory rate 16, blood pressure 119/79.  Weight is 200 pounds.  Head and Neck:  Show a normocephalic, atraumatic skull. There are no ocular or oral lesions.  There are no palpable cervical or supraclavicular lymph nodes.  Lungs:  Clear bilaterally.  Cardiac: Regular rate and rhythm with a normal S1 and S2.  There are no murmurs, rubs, or bruits.  Abdomen:  Soft.  He has good bowel sounds.  There is no fluid wave.  There is no palpable hepatosplenomegaly.  His liver edge might be palpable with deep inspiration.  Extremities:  Show no clubbing, cyanosis, or edema.  He has good range of motion of his joints.  Neurological:  Shows no focal neurological deficits.  LABORATORY STUDIES:  White cell count is 4.9.  Hemoglobin 12.3, hematocrit 36.7, platelet count 298.  Bilirubin 0.8.  Albumin 3.4.  IMPRESSION:  Mr. Sole is a 57 year old gentleman with metastatic pancreatic cancer.  We are starting his 4th cycle of chemotherapy today.  We will plan for a followup CT scan after this cycle.  We will plan for a followup in early September, after Labor Day.    ______________________________ Josph Macho, M.D. PRE/MEDQ  D:  10/16/2012  T:  10/17/2012  Job:  [id number]

## 2012-10-21 ENCOUNTER — Other Ambulatory Visit: Payer: Self-pay | Admitting: *Deleted

## 2012-10-21 DIAGNOSIS — C259 Malignant neoplasm of pancreas, unspecified: Secondary | ICD-10-CM

## 2012-10-21 DIAGNOSIS — R509 Fever, unspecified: Secondary | ICD-10-CM

## 2012-10-21 MED ORDER — AMOXICILLIN-POT CLAVULANATE 875-125 MG PO TABS: 1.0000 | ORAL_TABLET | Freq: Two times a day (BID) | ORAL | Status: DC

## 2012-10-21 NOTE — Telephone Encounter (Signed)
Temp last evening. Low grade this am. Instructed to go to the ER. Pt refused to go. Fiance called back this morning wanting to speak with Dr [name]. Gave him the message but in the meantime to start Augmentin BID x 10 days. He will call later

## 2012-10-23 ENCOUNTER — Other Ambulatory Visit (HOSPITAL_BASED_OUTPATIENT_CLINIC_OR_DEPARTMENT_OTHER): Payer: BC Managed Care – PPO | Admitting: Lab

## 2012-10-23 ENCOUNTER — Ambulatory Visit (HOSPITAL_BASED_OUTPATIENT_CLINIC_OR_DEPARTMENT_OTHER): Payer: BC Managed Care – PPO | Admitting: Hematology & Oncology

## 2012-10-23 ENCOUNTER — Ambulatory Visit (HOSPITAL_BASED_OUTPATIENT_CLINIC_OR_DEPARTMENT_OTHER): Payer: BC Managed Care – PPO

## 2012-10-23 VITALS — BP 104/71 | HR 91 | Temp 98.7°F | Resp 18 | Ht 74.0 in | Wt 189.0 lb

## 2012-10-23 DIAGNOSIS — C259 Malignant neoplasm of pancreas, unspecified: Secondary | ICD-10-CM

## 2012-10-23 DIAGNOSIS — C250 Malignant neoplasm of head of pancreas: Secondary | ICD-10-CM

## 2012-10-23 DIAGNOSIS — Z5111 Encounter for antineoplastic chemotherapy: Secondary | ICD-10-CM

## 2012-10-23 DIAGNOSIS — C778 Secondary and unspecified malignant neoplasm of lymph nodes of multiple regions: Secondary | ICD-10-CM

## 2012-10-23 DIAGNOSIS — R197 Diarrhea, unspecified: Secondary | ICD-10-CM

## 2012-10-23 LAB — CMP (CANCER CENTER ONLY)
Albumin: 3.3 g/dL (ref 3.3–5.5)
Alkaline Phosphatase: 84 U/L (ref 26–84)
BUN, Bld: 14 mg/dL (ref 7–22)
Creat: 1 mg/dl (ref 0.6–1.2)
Glucose, Bld: 120 mg/dL — ABNORMAL HIGH (ref 73–118)
Potassium: 3.6 mEq/L (ref 3.3–4.7)

## 2012-10-23 LAB — CBC WITH DIFFERENTIAL (CANCER CENTER ONLY)
BASO#: 0 10*3/uL (ref 0.0–0.2)
EOS%: 0.5 % (ref 0.0–7.0)
Eosinophils Absolute: 0 10*3/uL (ref 0.0–0.5)
HCT: 37.7 % — ABNORMAL LOW (ref 38.7–49.9)
HGB: 12.7 g/dL — ABNORMAL LOW (ref 13.0–17.1)
LYMPH%: 11.6 % — ABNORMAL LOW (ref 14.0–48.0)
MCH: 33.4 pg (ref 28.0–33.4)
MCHC: 33.7 g/dL (ref 32.0–35.9)
MCV: 99 fL — ABNORMAL HIGH (ref 82–98)
MONO%: 13.7 % — ABNORMAL HIGH (ref 0.0–13.0)
NEUT#: 3.1 10*3/uL (ref 1.5–6.5)
NEUT%: 74 % (ref 40.0–80.0)
RBC: 3.8 10*6/uL — ABNORMAL LOW (ref 4.20–5.70)

## 2012-10-23 MED ORDER — SODIUM CHLORIDE 0.9 % IV SOLN
800.0000 mg/m2 | Freq: Once | INTRAVENOUS | Status: AC
  Administered 2012-10-23: 1824 mg via INTRAVENOUS
  Filled 2012-10-23: qty 47.97

## 2012-10-23 MED ORDER — ONDANSETRON 8 MG/50ML IVPB (CHCC)
8.0000 mg | Freq: Once | INTRAVENOUS | Status: AC
  Administered 2012-10-23: 8 mg via INTRAVENOUS

## 2012-10-23 MED ORDER — SODIUM CHLORIDE 0.9 % IV SOLN
Freq: Once | INTRAVENOUS | Status: AC
  Administered 2012-10-23: 11:00:00 via INTRAVENOUS

## 2012-10-23 MED ORDER — PACLITAXEL PROTEIN-BOUND CHEMO INJECTION 100 MG
125.0000 mg/m2 | Freq: Once | INTRAVENOUS | Status: AC
  Administered 2012-10-23: 275 mg via INTRAVENOUS
  Filled 2012-10-23: qty 55

## 2012-10-23 MED ORDER — DEXAMETHASONE SODIUM PHOSPHATE 10 MG/ML IJ SOLN
10.0000 mg | Freq: Once | INTRAMUSCULAR | Status: AC
  Administered 2012-10-23: 10 mg via INTRAVENOUS

## 2012-10-23 NOTE — Progress Notes (Signed)
This office note has been dictated.

## 2012-10-23 NOTE — Patient Instructions (Addendum)
[facility] Discharge Instructions for Patients Receiving Chemotherapy  Today you received the following chemotherapy agents Gemzar and Abraxane.  To help prevent nausea and vomiting after your treatment, we encourage you to take your nausea medication.   If you develop nausea and vomiting that is not controlled by your nausea medication, call the clinic.   BELOW ARE SYMPTOMS THAT SHOULD BE REPORTED IMMEDIATELY:  *FEVER GREATER THAN 100.5 F  *CHILLS WITH OR WITHOUT FEVER  NAUSEA AND VOMITING THAT IS NOT CONTROLLED WITH YOUR NAUSEA MEDICATION  *UNUSUAL SHORTNESS OF BREATH  *UNUSUAL BRUISING OR BLEEDING  TENDERNESS IN MOUTH AND THROAT WITH OR WITHOUT PRESENCE OF ULCERS  *URINARY PROBLEMS  *BOWEL PROBLEMS  UNUSUAL RASH Items with * indicate a potential emergency and should be followed up as soon as possible.  Feel free to call the clinic you have any questions or concerns. The clinic phone number is [phone].    

## 2012-10-24 NOTE — Progress Notes (Signed)
DIAGNOSIS:  Metastatic pancreatic cancer.  CURRENT THERAPY:  Patient is status post 3 cycles of Gemzar/Abraxane.  INTERIM HISTORY:  Austin Escobar comes in for followup.  He actually started his 4th cycle of Gemzar/Abraxane last week.  His fiancee called saying that he was not doing too well.  A couple of days afterward, he really was feeling tired.  He was not eating well.  He was having a temperature.  I went ahead and put him on some Augmentin.  I was worried that he had a stent in, and this may have been infected.  A couple of days later, he felt a whole lot better.  Currently, he feels great.  He has no abdominal pain.  He has lost some weight since we last saw him.  He lost 11 pounds actually.  Unfortunately, his last CA-19-9 is going up.  It was 8950 when we saw him.  A month ago, it was 2800.  He will be due for scans after this 4th cycle.  He is on pancreatic enzyme replacement.  This is improving his diarrhea a little bit.  PHYSICAL EXAM:  General:  This is a well-developed, well-nourished white gentleman in no obvious distress.  Vital Signs:  Show a temperature of 98.7, pulse 91, respiratory rate 18, blood pressure 104/71.  Weight is 189.  Head and Neck:  Show a normocephalic, atraumatic skull.  There are no ocular or oral lesions.  There are no palpable cervical or supraclavicular lymph nodes.  Lungs:  Clear bilaterally.  Cardiac: Regular rate and rhythm with a normal S1 and S2.  There are no murmurs, rubs, or bruits.  Abdomen:  Soft.  He has good bowel sounds.  There is no fluid wave.  There is no palpable abdominal mass.  There is no palpable hepatosplenomegaly.  Extremities:  Show no clubbing, cyanosis, or edema.  Neurological:  Shows no focal neurological deficits.  Skin: No rashes, ecchymosis, or petechia.  LABORATORY:  Shows a white cell count of 4.2, hemoglobin 12.7, hematocrit 37.7, platelet count 186.  IMPRESSION:  Austin Escobar is a very nice 57 year old  gentleman with metastatic pancreatic cancer.  We will have to see what his CAT scan looks like after this cycle. Again, with his CA-19-9 going up we have to really be cautious here.  We will plan to get him back to see [place] in another couple weeks or so.  I think if we do find progressive disease, we will have to switch him to FOLFIRINOX.    ______________________________ Josph Macho, M.D. PRE/MEDQ  D:  10/23/2012  T:  10/24/2012  Job:  [id number]

## 2012-11-03 ENCOUNTER — Encounter (HOSPITAL_BASED_OUTPATIENT_CLINIC_OR_DEPARTMENT_OTHER): Payer: Self-pay

## 2012-11-03 ENCOUNTER — Ambulatory Visit (HOSPITAL_BASED_OUTPATIENT_CLINIC_OR_DEPARTMENT_OTHER)
Admission: RE | Admit: 2012-11-03 | Discharge: 2012-11-03 | Disposition: A | Discharge status: Home / Self Care | Payer: BC Managed Care – PPO | Source: Ambulatory Visit | Attending: Hematology & Oncology | Admitting: Hematology & Oncology

## 2012-11-03 DIAGNOSIS — C259 Malignant neoplasm of pancreas, unspecified: Secondary | ICD-10-CM

## 2012-11-03 DIAGNOSIS — Z79899 Other long term (current) drug therapy: Secondary | ICD-10-CM | POA: Insufficient documentation

## 2012-11-03 DIAGNOSIS — K7689 Other specified diseases of liver: Secondary | ICD-10-CM | POA: Insufficient documentation

## 2012-11-03 MED ORDER — IOHEXOL 300 MG/ML  SOLN
100.0000 mL | Freq: Once | INTRAMUSCULAR | Status: AC | PRN
  Administered 2012-11-03: 100 mL via INTRAVENOUS

## 2012-11-06 ENCOUNTER — Other Ambulatory Visit: Payer: Self-pay | Admitting: *Deleted

## 2012-11-06 ENCOUNTER — Ambulatory Visit: Payer: BC Managed Care – PPO

## 2012-11-06 ENCOUNTER — Ambulatory Visit (HOSPITAL_BASED_OUTPATIENT_CLINIC_OR_DEPARTMENT_OTHER): Payer: BC Managed Care – PPO | Admitting: Hematology & Oncology

## 2012-11-06 ENCOUNTER — Other Ambulatory Visit (HOSPITAL_BASED_OUTPATIENT_CLINIC_OR_DEPARTMENT_OTHER): Payer: BC Managed Care – PPO | Admitting: Lab

## 2012-11-06 VITALS — BP 125/85 | HR 18 | Temp 97.9°F | Resp 18 | Ht 73.0 in | Wt 192.0 lb

## 2012-11-06 DIAGNOSIS — C259 Malignant neoplasm of pancreas, unspecified: Secondary | ICD-10-CM

## 2012-11-06 DIAGNOSIS — R197 Diarrhea, unspecified: Secondary | ICD-10-CM

## 2012-11-06 DIAGNOSIS — C787 Secondary malignant neoplasm of liver and intrahepatic bile duct: Secondary | ICD-10-CM

## 2012-11-06 DIAGNOSIS — C250 Malignant neoplasm of head of pancreas: Secondary | ICD-10-CM

## 2012-11-06 LAB — CBC WITH DIFFERENTIAL (CANCER CENTER ONLY)
BASO%: 0.6 % (ref 0.0–2.0)
LYMPH#: 0.9 10*3/uL (ref 0.9–3.3)
LYMPH%: 13.3 % — ABNORMAL LOW (ref 14.0–48.0)
MCV: 100 fL — ABNORMAL HIGH (ref 82–98)
MONO#: 0.8 10*3/uL (ref 0.1–0.9)
NEUT#: 4.7 10*3/uL (ref 1.5–6.5)
Platelets: 254 10*3/uL (ref 145–400)
RBC: 3.83 10*6/uL — ABNORMAL LOW (ref 4.20–5.70)
RDW: 14.5 % (ref 11.1–15.7)
WBC: 6.4 10*3/uL (ref 4.0–10.0)

## 2012-11-06 LAB — CMP (CANCER CENTER ONLY)
ALT(SGPT): 31 U/L (ref 10–47)
AST: 33 U/L (ref 11–38)
Calcium: 8.9 mg/dL (ref 8.0–10.3)
Chloride: 104 mEq/L (ref 98–108)
Creat: 0.6 mg/dl (ref 0.6–1.2)
Sodium: 135 mEq/L (ref 128–145)
Total Protein: 6.8 g/dL (ref 6.4–8.1)

## 2012-11-06 MED ORDER — DIPHENOXYLATE-ATROPINE 2.5-0.025 MG PO TABS: 1.0000 | ORAL_TABLET | Freq: Four times a day (QID) | ORAL | Status: DC | PRN

## 2012-11-06 MED ORDER — LIDOCAINE-PRILOCAINE 2.5-2.5 % EX CREA: TOPICAL_CREAM | CUTANEOUS | Status: DC | PRN

## 2012-11-06 NOTE — Patient Instructions (Signed)
Irinotecan injection What is this medicine? IRINOTECAN (ir in oh TEE kan ) is a chemotherapy drug. It is used to treat colon and rectal cancer. This medicine may be used for other purposes; ask your health care provider or pharmacist if you have questions. What should I tell my health care provider before I take this medicine? They need to know if you have any of these conditions: -blood disorders -dehydration -diarrhea -infection (especially a virus infection such as chickenpox, cold sores, or herpes) -liver disease -low blood counts, like low white cell, platelet, or red cell counts -recent or ongoing radiation therapy -an unusual or allergic reaction to irinotecan, sorbitol, other chemotherapy, other medicines, foods, dyes, or preservatives -pregnant or trying to get pregnant -breast-feeding How should I use this medicine? This drug is given as an infusion into a vein. It is administered in a hospital or clinic by a specially trained health care professional. Talk to your pediatrician regarding the use of this medicine in children. Special care may be needed. Overdosage: If you think you have taken too much of this medicine contact a poison control center or emergency room at once. NOTE: This medicine is only for you. Do not share this medicine with others. What if I miss a dose? It is important not to miss your dose. Call your doctor or health care professional if you are unable to keep an appointment. What may interact with this medicine? Do not take this medicine with any of the following medications: -atazanavir -ketoconazole -St. John's Wort This medicine may also interact with the following medications: -dexamethasone -diuretics -laxatives -medicines for seizures like carbamazepine, mephobarbital, phenobarbital, phenytoin, primidone -medicines to increase blood counts like filgrastim, pegfilgrastim, sargramostim -prochlorperazine -vaccines This list may not describe all  possible interactions. Give your health care provider a list of all the medicines, herbs, non-prescription drugs, or dietary supplements you use. Also tell them if you smoke, drink alcohol, or use illegal drugs. Some items may interact with your medicine. What should I watch for while using this medicine? Your condition will be monitored carefully while you are receiving this medicine. You will need important blood work done while you are taking this medicine. This drug may make you feel generally unwell. This is not uncommon, as chemotherapy can affect healthy cells as well as cancer cells. Report any side effects. Continue your course of treatment even though you feel ill unless your doctor tells you to stop. In some cases, you may be given additional medicines to help with side effects. Follow all directions for their use. You may get drowsy or dizzy. Do not drive, use machinery, or do anything that needs mental alertness until you know how this medicine affects you. Do not stand or sit up quickly, especially if you are an older patient. This reduces the risk of dizzy or fainting spells. Call your doctor or health care professional for advice if you get a fever, chills or sore throat, or other symptoms of a cold or flu. Do not treat yourself. This drug decreases your body's ability to fight infections. Try to avoid being around people who are sick. This medicine may increase your risk to bruise or bleed. Call your doctor or health care professional if you notice any unusual bleeding. Be careful brushing and flossing your teeth or using a toothpick because you may get an infection or bleed more easily. If you have any dental work done, tell your dentist you are receiving this medicine. Avoid taking products that contain  aspirin, acetaminophen, ibuprofen, naproxen, or ketoprofen unless instructed by your doctor. These medicines may hide a fever. Do not become pregnant while taking this medicine. Women  should inform their doctor if they wish to become pregnant or think they might be pregnant. There is a potential for serious side effects to an unborn child. Talk to your health care professional or pharmacist for more information. Do not breast-feed an infant while taking this medicine. What side effects may I notice from receiving this medicine? Side effects that you should report to your doctor or health care professional as soon as possible: -allergic reactions like skin rash, itching or hives, swelling of the face, lips, or tongue -low blood counts - this medicine may decrease the number of white blood cells, red blood cells and platelets. You may be at increased risk for infections and bleeding. -signs of infection - fever or chills, cough, sore throat, pain or difficulty passing urine -signs of decreased platelets or bleeding - bruising, pinpoint red spots on the skin, black, tarry stools, blood in the urine -signs of decreased red blood cells - unusually weak or tired, fainting spells, lightheadedness -breathing problems -chest pain -diarrhea -feeling faint or lightheaded, falls -flushing, runny nose, sweating during infusion -mouth sores or pain -pain, swelling, redness or irritation where injected -pain, swelling, warmth in the leg -pain, tingling, numbness in the hands or feet -problems with balance, talking, walking -stomach cramps, pain -trouble passing urine or change in the amount of urine -vomiting as to be unable to hold down drinks or food -yellowing of the eyes or skin Side effects that usually do not require medical attention (report to your doctor or health care professional if they continue or are bothersome): -constipation -hair loss -headache -loss of appetite -nausea, vomiting -stomach upset This list may not describe all possible side effects. Call your doctor for medical advice about side effects. You may report side effects to FDA at 1-800-FDA-1088. Where  should I keep my medicine? This drug is given in a hospital or clinic and will not be stored at home. NOTE: This sheet is a summary. It may not cover all possible information. If you have questions about this medicine, talk to your doctor, pharmacist, or health care provider.  2012, [employer]. (07/07/2007 4:29:12 PM)Oxaliplatin Injection What is this medicine? OXALIPLATIN (ox AL i PLA tin) is a chemotherapy drug. It targets fast dividing cells, like cancer cells, and causes these cells to die. This medicine is used to treat cancers of the colon and rectum, and many other cancers. This medicine may be used for other purposes; ask your health care provider or pharmacist if you have questions. What should I tell my health care provider before I take this medicine? They need to know if you have any of these conditions: -kidney disease -an unusual or allergic reaction to oxaliplatin, other chemotherapy, other medicines, foods, dyes, or preservatives -pregnant or trying to get pregnant -breast-feeding How should I use this medicine? This drug is given as an infusion into a vein. It is administered in a hospital or clinic by a specially trained health care professional. Talk to your pediatrician regarding the use of this medicine in children. Special care may be needed. Overdosage: If you think you have taken too much of this medicine contact a poison control center or emergency room at once. NOTE: This medicine is only for you. Do not share this medicine with others. What if I miss a dose? It is important not to miss  a dose. Call your doctor or health care professional if you are unable to keep an appointment. What may interact with this medicine? -medicines to increase blood counts like filgrastim, pegfilgrastim, sargramostim -probenecid -some antibiotics like amikacin, gentamicin, neomycin, polymyxin B, streptomycin, tobramycin -zalcitabine Talk to your doctor or health care  professional before taking any of these medicines: -acetaminophen -aspirin -ibuprofen -ketoprofen -naproxen This list may not describe all possible interactions. Give your health care provider a list of all the medicines, herbs, non-prescription drugs, or dietary supplements you use. Also tell them if you smoke, drink alcohol, or use illegal drugs. Some items may interact with your medicine. What should I watch for while using this medicine? Your condition will be monitored carefully while you are receiving this medicine. You will need important blood work done while you are taking this medicine. This medicine can make you more sensitive to cold. Do not drink cold drinks or use ice. Cover exposed skin before coming in contact with cold temperatures or cold objects. When out in cold weather wear warm clothing and cover your mouth and nose to warm the air that goes into your lungs. Tell your doctor if you get sensitive to the cold. This drug may make you feel generally unwell. This is not uncommon, as chemotherapy can affect healthy cells as well as cancer cells. Report any side effects. Continue your course of treatment even though you feel ill unless your doctor tells you to stop. In some cases, you may be given additional medicines to help with side effects. Follow all directions for their use. Call your doctor or health care professional for advice if you get a fever, chills or sore throat, or other symptoms of a cold or flu. Do not treat yourself. This drug decreases your body's ability to fight infections. Try to avoid being around people who are sick. This medicine may increase your risk to bruise or bleed. Call your doctor or health care professional if you notice any unusual bleeding. Be careful brushing and flossing your teeth or using a toothpick because you may get an infection or bleed more easily. If you have any dental work done, tell your dentist you are receiving this medicine. Avoid  taking products that contain aspirin, acetaminophen, ibuprofen, naproxen, or ketoprofen unless instructed by your doctor. These medicines may hide a fever. Do not become pregnant while taking this medicine. Women should inform their doctor if they wish to become pregnant or think they might be pregnant. There is a potential for serious side effects to an unborn child. Talk to your health care professional or pharmacist for more information. Do not breast-feed an infant while taking this medicine. Call your doctor or health care professional if you get diarrhea. Do not treat yourself. What side effects may I notice from receiving this medicine? Side effects that you should report to your doctor or health care professional as soon as possible: -allergic reactions like skin rash, itching or hives, swelling of the face, lips, or tongue -low blood counts - This drug may decrease the number of white blood cells, red blood cells and platelets. You may be at increased risk for infections and bleeding. -signs of infection - fever or chills, cough, sore throat, pain or difficulty passing urine -signs of decreased platelets or bleeding - bruising, pinpoint red spots on the skin, black, tarry stools, nosebleeds -signs of decreased red blood cells - unusually weak or tired, fainting spells, lightheadedness -breathing problems -chest pain, pressure -cough -diarrhea -jaw tightness -  mouth sores -nausea and vomiting -pain, swelling, redness or irritation at the injection site -pain, tingling, numbness in the hands or feet -problems with balance, talking, walking -redness, blistering, peeling or loosening of the skin, including inside the mouth -trouble passing urine or change in the amount of urine Side effects that usually do not require medical attention (report to your doctor or health care professional if they continue or are bothersome): -changes in vision -constipation -hair loss -loss of  appetite -metallic taste in the mouth or changes in taste -stomach pain This list may not describe all possible side effects. Call your doctor for medical advice about side effects. You may report side effects to FDA at 1-800-FDA-1088. Where should I keep my medicine? This drug is given in a hospital or clinic and will not be stored at home. NOTE: This sheet is a summary. It may not cover all possible information. If you have questions about this medicine, talk to your doctor, pharmacist, or health care provider.  2012, [employer] Standard. (09/15/2007 5:22:47 PM)Fluorouracil, 5-FU injection What is this medicine? FLUOROURACIL, 5-FU (flure oh YOOR a sil) is a chemotherapy drug. It slows the growth of cancer cells. This medicine is used to treat many types of cancer like breast cancer, colon or rectal cancer, pancreatic cancer, and stomach cancer. This medicine may be used for other purposes; ask your health care provider or pharmacist if you have questions. What should I tell my health care provider before I take this medicine? They need to know if you have any of these conditions: -blood disorders -dihydropyrimidine dehydrogenase (DPD) deficiency -infection (especially a virus infection such as chickenpox, cold sores, or herpes) -kidney disease -liver disease -malnourished, poor nutrition -recent or ongoing radiation therapy -an unusual or allergic reaction to fluorouracil, other chemotherapy, other medicines, foods, dyes, or preservatives -pregnant or trying to get pregnant -breast-feeding How should I use this medicine? This drug is given as an infusion or injection into a vein. It is administered in a hospital or clinic by a specially trained health care professional. Talk to your pediatrician regarding the use of this medicine in children. Special care may be needed. Overdosage: If you think you have taken too much of this medicine contact a poison control center or emergency room  at once. NOTE: This medicine is only for you. Do not share this medicine with others. What if I miss a dose? It is important not to miss your dose. Call your doctor or health care professional if you are unable to keep an appointment. What may interact with this medicine? -allopurinol -cimetidine -dapsone -digoxin -hydroxyurea -leucovorin -levamisole -medicines for seizures like ethotoin, fosphenytoin, phenytoin -medicines to increase blood counts like filgrastim, pegfilgrastim, sargramostim -medicines that treat or prevent blood clots like warfarin, enoxaparin, and dalteparin -methotrexate -metronidazole -pyrimethamine -some other chemotherapy drugs like busulfan, cisplatin, estramustine, vinblastine -trimethoprim -trimetrexate -vaccines Talk to your doctor or health care professional before taking any of these medicines: -acetaminophen -aspirin -ibuprofen -ketoprofen -naproxen This list may not describe all possible interactions. Give your health care provider a list of all the medicines, herbs, non-prescription drugs, or dietary supplements you use. Also tell them if you smoke, drink alcohol, or use illegal drugs. Some items may interact with your medicine. What should I watch for while using this medicine? Visit your doctor for checks on your progress. This drug may make you feel generally unwell. This is not uncommon, as chemotherapy can affect healthy cells as well as cancer cells. Report any side effects.  Continue your course of treatment even though you feel ill unless your doctor tells you to stop. In some cases, you may be given additional medicines to help with side effects. Follow all directions for their use. Call your doctor or health care professional for advice if you get a fever, chills or sore throat, or other symptoms of a cold or flu. Do not treat yourself. This drug decreases your body's ability to fight infections. Try to avoid being around people who are  sick. This medicine may increase your risk to bruise or bleed. Call your doctor or health care professional if you notice any unusual bleeding. Be careful brushing and flossing your teeth or using a toothpick because you may get an infection or bleed more easily. If you have any dental work done, tell your dentist you are receiving this medicine. Avoid taking products that contain aspirin, acetaminophen, ibuprofen, naproxen, or ketoprofen unless instructed by your doctor. These medicines may hide a fever. Do not become pregnant while taking this medicine. Women should inform their doctor if they wish to become pregnant or think they might be pregnant. There is a potential for serious side effects to an unborn child. Talk to your health care professional or pharmacist for more information. Do not breast-feed an infant while taking this medicine. Men should inform their doctor if they wish to father a child. This medicine may lower sperm counts. Do not treat diarrhea with over the counter products. Contact your doctor if you have diarrhea that lasts more than 2 days or if it is severe and watery. This medicine can make you more sensitive to the sun. Keep out of the sun. If you cannot avoid being in the sun, wear protective clothing and use sunscreen. Do not use sun lamps or tanning beds/booths. What side effects may I notice from receiving this medicine? Side effects that you should report to your doctor or health care professional as soon as possible: -allergic reactions like skin rash, itching or hives, swelling of the face, lips, or tongue -low blood counts - this medicine may decrease the number of white blood cells, red blood cells and platelets. You may be at increased risk for infections and bleeding. -signs of infection - fever or chills, cough, sore throat, pain or difficulty passing urine -signs of decreased platelets or bleeding - bruising, pinpoint red spots on the skin, black, tarry stools,  blood in the urine -signs of decreased red blood cells - unusually weak or tired, fainting spells, lightheadedness -breathing problems -changes in vision -chest pain -mouth sores -nausea and vomiting -pain, swelling, redness at site where injected -pain, tingling, numbness in the hands or feet -redness, swelling, or sores on hands or feet -stomach pain -unusual bleeding Side effects that usually do not require medical attention (report to your doctor or health care professional if they continue or are bothersome): -changes in finger or toe nails -diarrhea -dry or itchy skin -hair loss -headache -loss of appetite -sensitivity of eyes to the light -stomach upset -unusually teary eyes This list may not describe all possible side effects. Call your doctor for medical advice about side effects. You may report side effects to FDA at 1-800-FDA-1088. Where should I keep my medicine? This drug is given in a hospital or clinic and will not be stored at home. NOTE: This sheet is a summary. It may not cover all possible information. If you have questions about this medicine, talk to your doctor, pharmacist, or health care provider.  2012,  [employer]. (06/24/2007 1:53:16 PM)

## 2012-11-06 NOTE — Progress Notes (Signed)
This office note has been dictated.

## 2012-11-06 NOTE — Progress Notes (Signed)
Patient seen by Dr. [name], will cancel treatment today. New plan to be ordered.

## 2012-11-07 LAB — PREALBUMIN: Prealbumin: 29.4 mg/dL (ref 17.0–34.0)

## 2012-11-07 LAB — CANCER ANTIGEN 19-9: CA 19-9: 21467.7 U/mL — ABNORMAL HIGH (ref <35.0)

## 2012-11-07 NOTE — Progress Notes (Signed)
CC:   [name]. [name], DO  DIAGNOSIS:  Metastatic pancreatic cancer - progressed.  CURRENT THERAPY:  Patient to start second-line therapy with FOLFIRINOX next week.  INTERIM HISTORY:  [name] comes in for his followup.  Unfortunately, we have documented progressive disease.  We did go ahead and do scans on him.  They were done on September 2nd.  The scans unfortunately did show progression into his liver.  There is a stable pancreatic head mass measuring 3 x 3.2 cm.  There was a low-density lesion on the body of the pancreas measuring 2.3 x 1.9 cm.  He did have some low density lesions in the liver that were new.  No fluid was noted.  No lymphadenopathy was appreciated.  His CA-19-9 has been going up.  Recently, it was 13,400.  He has had some chronic diarrhea as a baseline issue.  He is on Creon. He is on Librarian, academic. I will go ahead and get him on some Lomotil. He has had no pain.  He has had no problems with the biliary stent that was placed. He has had no cough.  He has had no leg swelling. He has had no rashes. There is no fever, bleeding, visual disturbances, headache.  Overall, performance status, he is ECOG 1.  PHYSICAL EXAMINATION:  General:  This is a well-developed, well- nourished white gentleman in no obvious distress.  Vital Signs:  Show temperature of 97.9, pulse 82, respiratory rate 18, blood pressure 125/85.  Weight is 192 pounds. Head and Neck:  Exam shows a normocephalic, atraumatic skull.  There are no ocular or oral lesions. There are no palpable cervical or supraclavicular lymph nodes.  Lungs: Clear bilaterally.  Cardiac:  Regular rate and rhythm with a normal S1, S2.  There are no murmurs, rubs or bruits.  Abdomen:  Soft.  He has good bowel sounds.  There is no fluid wave.  There is no guarding or rebound tenderness.  There is no palpable hepatosplenomegaly.  Extremities: Show no clubbing, cyanosis or edema. Neurological:  Exam shows no focal neurological  deficits.  LABORATORY STUDIES:  White cell count is 6.4, hemoglobin 12.8, hematocrit 38.2, platelet count 254.  IMPRESSION:  Austin Escobar is a very nice 57 year old gentleman with metastatic pancreatic cancer.  Unfortunately, he is progressing. He is still in good shape.  I think that second-line therapy now with FOLFIRINOX would be appropriate for him.  He will need to have a Port-A-Cath placed.  We will have to see about the Port-A-Cath being placed on the 9th of September.  I want to try and get treatment going on him on the 10th.  We will possibly make some dosage adjustments when we see how his blood counts hold up.  I would plan for 3 cycles of treatment and then repeat a CT scan to see how things look.  I spent a good hour or so with Mr. Oquinn and his fiance. We gave them information sheets about the chemotherapy.  We will plan see him back on the 24th, which would be his second cycle of treatment.    ______________________________ Josph Macho, M.D. PRE/MEDQ  D:  11/06/2012  T:  11/07/2012  Job:  [id number]

## 2012-11-09 ENCOUNTER — Encounter (HOSPITAL_COMMUNITY): Payer: Self-pay | Admitting: Pharmacy Technician

## 2012-11-09 ENCOUNTER — Other Ambulatory Visit: Payer: Self-pay | Admitting: Radiology

## 2012-11-10 ENCOUNTER — Other Ambulatory Visit: Payer: Self-pay | Admitting: Hematology & Oncology

## 2012-11-10 ENCOUNTER — Ambulatory Visit (HOSPITAL_COMMUNITY)
Admission: RE | Admit: 2012-11-10 | Discharge: 2012-11-10 | Disposition: A | Discharge status: Home / Self Care | Payer: BC Managed Care – PPO | Source: Ambulatory Visit | Attending: Hematology & Oncology | Admitting: Hematology & Oncology

## 2012-11-10 ENCOUNTER — Encounter (HOSPITAL_COMMUNITY): Payer: Self-pay

## 2012-11-10 DIAGNOSIS — C259 Malignant neoplasm of pancreas, unspecified: Secondary | ICD-10-CM | POA: Insufficient documentation

## 2012-11-10 DIAGNOSIS — I4891 Unspecified atrial fibrillation: Secondary | ICD-10-CM | POA: Insufficient documentation

## 2012-11-10 DIAGNOSIS — Z79899 Other long term (current) drug therapy: Secondary | ICD-10-CM | POA: Insufficient documentation

## 2012-11-10 DIAGNOSIS — I10 Essential (primary) hypertension: Secondary | ICD-10-CM | POA: Insufficient documentation

## 2012-11-10 LAB — PROTIME-INR: INR: 1.09 (ref 0.00–1.49)

## 2012-11-10 MED ORDER — CEFAZOLIN SODIUM-DEXTROSE 2-3 GM-% IV SOLR
2.0000 g | INTRAVENOUS | Status: AC
  Administered 2012-11-10: 2 g via INTRAVENOUS
  Filled 2012-11-10: qty 50

## 2012-11-10 MED ORDER — LIDOCAINE HCL 1 % IJ SOLN
INTRAMUSCULAR | Status: AC
  Filled 2012-11-10: qty 20

## 2012-11-10 MED ORDER — FENTANYL CITRATE 0.05 MG/ML IJ SOLN
INTRAMUSCULAR | Status: AC
  Filled 2012-11-10: qty 6

## 2012-11-10 MED ORDER — SODIUM CHLORIDE 0.9 % IV SOLN
INTRAVENOUS | Status: DC
  Administered 2012-11-10: 20 mL/h via INTRAVENOUS

## 2012-11-10 MED ORDER — MIDAZOLAM HCL 2 MG/2ML IJ SOLN
INTRAMUSCULAR | Status: AC
  Filled 2012-11-10: qty 6

## 2012-11-10 MED ORDER — MIDAZOLAM HCL 2 MG/2ML IJ SOLN
INTRAMUSCULAR | Status: AC | PRN
  Administered 2012-11-10: 1 mg via INTRAVENOUS
  Administered 2012-11-10: 0.5 mg via INTRAVENOUS
  Administered 2012-11-10: 1 mg via INTRAVENOUS
  Administered 2012-11-10: 0.5 mg via INTRAVENOUS

## 2012-11-10 MED ORDER — HEPARIN SOD (PORK) LOCK FLUSH 100 UNIT/ML IV SOLN
INTRAVENOUS | Status: AC | PRN
  Administered 2012-11-10: 500 [IU]

## 2012-11-10 MED ORDER — LIDOCAINE-EPINEPHRINE (PF) 2 %-1:200000 IJ SOLN
INTRAMUSCULAR | Status: AC
  Filled 2012-11-10: qty 20

## 2012-11-10 MED ORDER — FENTANYL CITRATE 0.05 MG/ML IJ SOLN
INTRAMUSCULAR | Status: AC | PRN
  Administered 2012-11-10 (×2): 25 ug via INTRAVENOUS
  Administered 2012-11-10: 100 ug via INTRAVENOUS

## 2012-11-10 NOTE — H&P (Signed)
Agree with PA note.  Signed,  Heath K. McCullough, MD [facility]  
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
## 2012-11-10 NOTE — Procedures (Signed)
Interventional Radiology Procedure Note  Procedure: Placement of a right IJ approach single lumen PowerPort.  Tip is positioned at the superior cavoatrial junction and catheter is ready for immediate use.  Complications: No immediate Recommendations:  - Ok to shower tomorrow - Do not submerge for 7 days - Routine line care   Signed,  Esthela Brandner K. Calianna Kim, MD Vascular & Interventional Radiologist [facility]  

## 2012-11-11 ENCOUNTER — Other Ambulatory Visit: Payer: Self-pay | Admitting: *Deleted

## 2012-11-11 ENCOUNTER — Ambulatory Visit (HOSPITAL_BASED_OUTPATIENT_CLINIC_OR_DEPARTMENT_OTHER): Payer: BC Managed Care – PPO

## 2012-11-11 VITALS — BP 90/50 | HR 75 | Temp 98.4°F | Resp 18

## 2012-11-11 DIAGNOSIS — C778 Secondary and unspecified malignant neoplasm of lymph nodes of multiple regions: Secondary | ICD-10-CM

## 2012-11-11 DIAGNOSIS — C259 Malignant neoplasm of pancreas, unspecified: Secondary | ICD-10-CM

## 2012-11-11 DIAGNOSIS — C250 Malignant neoplasm of head of pancreas: Secondary | ICD-10-CM

## 2012-11-11 DIAGNOSIS — Z5111 Encounter for antineoplastic chemotherapy: Secondary | ICD-10-CM

## 2012-11-11 MED ORDER — SODIUM CHLORIDE 0.9 % IV SOLN
2400.0000 mg/m2 | INTRAVENOUS | Status: DC
  Administered 2012-11-11: 5100 mg via INTRAVENOUS
  Filled 2012-11-11: qty 102

## 2012-11-11 MED ORDER — LEUCOVORIN CALCIUM INJECTION 350 MG
400.0000 mg/m2 | Freq: Once | INTRAVENOUS | Status: AC
  Administered 2012-11-11: 848 mg via INTRAVENOUS
  Filled 2012-11-11: qty 42.4

## 2012-11-11 MED ORDER — IRINOTECAN HCL CHEMO INJECTION 100 MG/5ML
162.0000 mg/m2 | Freq: Once | INTRAVENOUS | Status: AC
  Administered 2012-11-11: 344 mg via INTRAVENOUS
  Filled 2012-11-11: qty 17.2

## 2012-11-11 MED ORDER — ALTEPLASE 2 MG IJ SOLR
2.0000 mg | Freq: Once | INTRAMUSCULAR | Status: DC | PRN
  Filled 2012-11-11: qty 2

## 2012-11-11 MED ORDER — SODIUM CHLORIDE 0.9 % IJ SOLN
3.0000 mL | INTRAMUSCULAR | Status: DC | PRN
  Filled 2012-11-11: qty 10

## 2012-11-11 MED ORDER — DEXAMETHASONE SODIUM PHOSPHATE 20 MG/5ML IJ SOLN
20.0000 mg | Freq: Once | INTRAMUSCULAR | Status: AC
  Administered 2012-11-11: 20 mg via INTRAVENOUS
  Filled 2012-11-11: qty 5

## 2012-11-11 MED ORDER — FLUOROURACIL CHEMO INJECTION 2.5 GM/50ML
400.0000 mg/m2 | Freq: Once | INTRAVENOUS | Status: AC
  Administered 2012-11-11: 850 mg via INTRAVENOUS
  Filled 2012-11-11: qty 17

## 2012-11-11 MED ORDER — SODIUM CHLORIDE 0.9 % IJ SOLN
10.0000 mL | INTRAMUSCULAR | Status: DC | PRN
  Filled 2012-11-11: qty 10

## 2012-11-11 MED ORDER — HEPARIN SOD (PORK) LOCK FLUSH 100 UNIT/ML IV SOLN
500.0000 [IU] | Freq: Once | INTRAVENOUS | Status: DC | PRN
  Filled 2012-11-11: qty 5

## 2012-11-11 MED ORDER — DEXTROSE 5 % IV SOLN
Freq: Once | INTRAVENOUS | Status: AC
  Administered 2012-11-11: 09:00:00 via INTRAVENOUS

## 2012-11-11 MED ORDER — HEPARIN SOD (PORK) LOCK FLUSH 100 UNIT/ML IV SOLN
250.0000 [IU] | Freq: Once | INTRAVENOUS | Status: DC | PRN
  Filled 2012-11-11: qty 5

## 2012-11-11 MED ORDER — DEXTROSE 5 % IV SOLN
85.0000 mg/m2 | Freq: Once | INTRAVENOUS | Status: AC
  Administered 2012-11-11: 180 mg via INTRAVENOUS
  Filled 2012-11-11: qty 36

## 2012-11-11 MED ORDER — LORAZEPAM 0.5 MG PO TABS
0.5000 mg | ORAL_TABLET | Freq: Once | ORAL | Status: AC
  Administered 2012-11-11: 0.5 mg via SUBLINGUAL

## 2012-11-11 MED ORDER — ONDANSETRON 16 MG/50ML IVPB (CHCC)
16.0000 mg | Freq: Once | INTRAVENOUS | Status: AC
  Administered 2012-11-11: 16 mg via INTRAVENOUS

## 2012-11-11 MED ORDER — ATROPINE SULFATE 1 MG/ML IJ SOLN
0.5000 mg | Freq: Once | INTRAMUSCULAR | Status: AC | PRN
  Administered 2012-11-11: 0.5 mg via INTRAVENOUS

## 2012-11-11 NOTE — Patient Instructions (Addendum)
[facility] Discharge Instructions for Patients Receiving Chemotherapy  Today you received the following chemotherapy agents Oxaliplatin, Irinotecan, 5FU, Leucovorin  To help prevent nausea and vomiting after your treatment, we encourage you to take your nausea medication   1) Zofran  (Ondansetron) 8 mg by mouth one in am and one in pm starting the day after chemotherapy.  Take this for 3 days in a row then twice daily as needed for moderate nausea.  2) Compazine  (Prochlorperazine) 10 mg by mouth every 6 hours as needed for nausea or vomiting. May start the day of chemo and take with other medications.   3) Decadron  (Dexamethasone 4 mg)  Take 2 tablets by mouth twice daily with a meal starting the day after chemotherapy.  Take this for 3 days then stop.    4) Ativan (Lorazepam)- take 1 mg tablet by mouth or under tongue every 8 hours for nausea, anxiety, or sleep.  May start the day of chemo and take with other medications.   If you develop nausea and vomiting that is not controlled by your nausea medication, call the clinic [phone] If it is after clinic hours your family physician or the after hours number for the clinic or go to the Emergency Department.   BELOW ARE SYMPTOMS THAT SHOULD BE REPORTED IMMEDIATELY:  *FEVER GREATER THAN 100.5 F  *CHILLS WITH OR WITHOUT FEVER  NAUSEA AND VOMITING THAT IS NOT CONTROLLED WITH YOUR NAUSEA MEDICATION  *UNUSUAL SHORTNESS OF BREATH  *UNUSUAL BRUISING OR BLEEDING  TENDERNESS IN MOUTH AND THROAT WITH OR WITHOUT PRESENCE OF ULCERS  *URINARY PROBLEMS  *BOWEL PROBLEMS  UNUSUAL RASH Items with * indicate a potential emergency and should be followed up as soon as possible.  One of the nurses will contact you 24 hours after your treatment. Please let the nurse know about any problems that you may have experienced. Feel free to call the clinic you have any questions or concerns. The clinic phone number is [phone]   I  have been informed and understand all the instructions given to me. I know to contact the clinic, my physician, or go to the Emergency Department if any problems should occur. I do not have any questions at this time, but understand that I may call the clinic during office hours   should I have any questions or need assistance in obtaining follow up care.    __________________________________________  _____________  __________ Signature of Patient or Authorized Representative            Date                   Time    __________________________________________ Nurse's Signature

## 2012-11-13 ENCOUNTER — Encounter: Payer: Self-pay | Admitting: Hematology & Oncology

## 2012-11-13 ENCOUNTER — Ambulatory Visit: Payer: BC Managed Care – PPO

## 2012-11-13 ENCOUNTER — Ambulatory Visit: Payer: BC Managed Care – PPO | Admitting: Hematology & Oncology

## 2012-11-13 ENCOUNTER — Other Ambulatory Visit: Payer: BC Managed Care – PPO | Admitting: Lab

## 2012-11-13 ENCOUNTER — Ambulatory Visit (HOSPITAL_BASED_OUTPATIENT_CLINIC_OR_DEPARTMENT_OTHER): Payer: BC Managed Care – PPO

## 2012-11-13 VITALS — BP 117/68 | HR 64 | Temp 98.6°F | Resp 20

## 2012-11-13 DIAGNOSIS — Z452 Encounter for adjustment and management of vascular access device: Secondary | ICD-10-CM

## 2012-11-13 DIAGNOSIS — C250 Malignant neoplasm of head of pancreas: Secondary | ICD-10-CM

## 2012-11-13 DIAGNOSIS — C259 Malignant neoplasm of pancreas, unspecified: Secondary | ICD-10-CM

## 2012-11-13 MED ORDER — HEPARIN SOD (PORK) LOCK FLUSH 100 UNIT/ML IV SOLN
500.0000 [IU] | Freq: Once | INTRAVENOUS | Status: AC | PRN
  Administered 2012-11-13: 500 [IU]
  Filled 2012-11-13: qty 5

## 2012-11-13 MED ORDER — SODIUM CHLORIDE 0.9 % IJ SOLN
10.0000 mL | INTRAMUSCULAR | Status: DC | PRN
  Administered 2012-11-13: 10 mL
  Filled 2012-11-13: qty 10

## 2012-11-13 NOTE — Patient Instructions (Addendum)
Implanted Port Instructions  An implanted port is a central line that has a round shape and is placed under the skin. It is used for long-term IV (intravenous) access for:  · Medicine.  · Fluids.  · Liquid nutrition, such as TPN (total parenteral nutrition).  · Blood samples.  Ports can be placed:  · In the chest area just below the collarbone (this is the most common place.)  · In the arms.  · In the belly (abdomen) area.  · In the legs.  PARTS OF THE PORT  A port has 2 main parts:  · The reservoir. The reservoir is round, disc-shaped, and will be a small, raised area under your skin.  · The reservoir is the part where a needle is inserted (accessed) to either give medicines or to draw blood.  · The catheter. The catheter is a long, slender tube that extends from the reservoir. The catheter is placed into a large vein.  · Medicine that is inserted into the reservoir goes into the catheter and then into the vein.  INSERTION OF THE PORT  · The port is surgically placed in either an operating room or in a procedural area (interventional radiology).  · Medicine may be given to help you relax during the procedure.  · The skin where the port will be inserted is numbed (local anesthetic).  · 1 or 2 small cuts (incisions) will be made in the skin to insert the port.  · The port can be used after it has been inserted.  INCISION SITE CARE  · The incision site may have small adhesive strips on it. This helps keep the incision site closed. Sometimes, no adhesive strips are placed. Instead of adhesive strips, a special kind of surgical glue is used to keep the incision closed.  · If adhesive strips were placed on the incision sites, do not take them off. They will fall off on their own.  · The incision site may be sore for 1 to 2 days. Pain medicine can help.  · Do not get the incision site wet. Bathe or shower as directed by your caregiver.  · The incision site should heal in 5 to 7 days. A small scar may form after the  incision has healed.  ACCESSING THE PORT  Special steps must be taken to access the port:  · Before the port is accessed, a numbing cream can be placed on the skin. This helps numb the skin over the port site.  · A sterile technique is used to access the port.  · The port is accessed with a needle. Only "non-coring" port needles should be used to access the port. Once the port is accessed, a blood return should be checked. This helps ensure the port is in the vein and is not clogged (clotted).  · If your caregiver believes your port should remain accessed, a clear (transparent) bandage will be placed over the needle site. The bandage and needle will need to be changed every week or as directed by your caregiver.  · Keep the bandage covering the needle clean and dry. Do not get it wet. Follow your caregiver's instructions on how to take a shower or bath when the port is accessed.  · If your port does not need to stay accessed, no bandage is needed over the port.  FLUSHING THE PORT  Flushing the port keeps it from getting clogged. How often the port is flushed depends on:  · If a   constant infusion is running. If a constant infusion is running, the port may not need to be flushed.  · If intermittent medicines are given.  · If the port is not being used.  For intermittent medicines:  · The port will need to be flushed:  · After medicines have been given.  · After blood has been drawn.  · As part of routine maintenance.  · A port is normally flushed with:  · Normal saline.  · Heparin.  · Follow your caregiver's advice on how often, how much, and the type of flush to use on your port.  IMPORTANT PORT INFORMATION  · Tell your caregiver if you are allergic to heparin.  · After your port is placed, you will get a manufacturer's information card. The card has information about your port. Keep this card with you at all times.  · There are many types of ports available. Know what kind of port you have.  · In case of an  emergency, it may be helpful to wear a medical alert bracelet. This can help alert health care workers that you have a port.  · The port can stay in for as long as your caregiver believes it is necessary.  · When it is time for the port to come out, surgery will be done to remove it. The surgery will be similar to how the port was put in.  · If you are in the hospital or clinic:  · Your port will be taken care of and flushed by a nurse.  · If you are at home:  · A home health care nurse may give medicines and take care of the port.  · You or a family member can get special training and directions for giving medicine and taking care of the port at home.  SEEK IMMEDIATE MEDICAL CARE IF:   · Your port does not flush or you are unable to get a blood return.  · New drainage or pus is coming from the incision.  · A bad smell is coming from the incision site.  · You develop swelling or increased redness at the incision site.  · You develop increased swelling or pain at the port site.  · You develop swelling or pain in the surrounding skin near the port.  · You have an oral temperature above 102° F (38.9° C), not controlled by medicine.  MAKE SURE YOU:   · Understand these instructions.  · Will watch your condition.  · Will get help right away if you are not doing well or get worse.  Document Released: 02/18/2005 Document Revised: 05/13/2011 Document Reviewed: 05/12/2008  [employer]® Patient Information ©2014 [employer].

## 2012-11-18 MED ORDER — ONDANSETRON HCL 8 MG PO TABS: ORAL_TABLET | ORAL | Status: DC

## 2012-11-18 MED ORDER — DEXAMETHASONE 4 MG PO TABS: ORAL_TABLET | ORAL | Status: DC

## 2012-11-18 MED ORDER — LORAZEPAM 1 MG PO TABS: 1.0000 mg | ORAL_TABLET | Freq: Three times a day (TID) | ORAL | Status: DC

## 2012-11-18 NOTE — Telephone Encounter (Signed)
Antiemetics were called in for the pt. Instructions were provided on how to take them prior to leaving the treatment room.

## 2012-11-25 ENCOUNTER — Ambulatory Visit (HOSPITAL_BASED_OUTPATIENT_CLINIC_OR_DEPARTMENT_OTHER): Payer: BC Managed Care – PPO | Admitting: Hematology & Oncology

## 2012-11-25 ENCOUNTER — Ambulatory Visit (HOSPITAL_BASED_OUTPATIENT_CLINIC_OR_DEPARTMENT_OTHER): Payer: BC Managed Care – PPO

## 2012-11-25 ENCOUNTER — Other Ambulatory Visit (HOSPITAL_BASED_OUTPATIENT_CLINIC_OR_DEPARTMENT_OTHER): Payer: BC Managed Care – PPO | Admitting: Lab

## 2012-11-25 VITALS — BP 115/74 | HR 78 | Temp 98.2°F | Resp 18 | Ht 73.0 in | Wt 190.0 lb

## 2012-11-25 DIAGNOSIS — C259 Malignant neoplasm of pancreas, unspecified: Secondary | ICD-10-CM

## 2012-11-25 DIAGNOSIS — C250 Malignant neoplasm of head of pancreas: Secondary | ICD-10-CM

## 2012-11-25 DIAGNOSIS — C778 Secondary and unspecified malignant neoplasm of lymph nodes of multiple regions: Secondary | ICD-10-CM

## 2012-11-25 DIAGNOSIS — Z5111 Encounter for antineoplastic chemotherapy: Secondary | ICD-10-CM

## 2012-11-25 LAB — CMP (CANCER CENTER ONLY)
Albumin: 3.2 g/dL — ABNORMAL LOW (ref 3.3–5.5)
BUN, Bld: 11 mg/dL (ref 7–22)
Calcium: 9.4 mg/dL (ref 8.0–10.3)
Chloride: 106 mEq/L (ref 98–108)
Glucose, Bld: 143 mg/dL — ABNORMAL HIGH (ref 73–118)
Potassium: 4.4 mEq/L (ref 3.3–4.7)

## 2012-11-25 LAB — CBC WITH DIFFERENTIAL (CANCER CENTER ONLY)
BASO#: 0 10*3/uL (ref 0.0–0.2)
Eosinophils Absolute: 0.1 10*3/uL (ref 0.0–0.5)
HCT: 38.7 % (ref 38.7–49.9)
HGB: 12.6 g/dL — ABNORMAL LOW (ref 13.0–17.1)
LYMPH#: 0.6 10*3/uL — ABNORMAL LOW (ref 0.9–3.3)
MCH: 32.6 pg (ref 28.0–33.4)
NEUT#: 3.6 10*3/uL (ref 1.5–6.5)
NEUT%: 72.3 % (ref 40.0–80.0)
RBC: 3.87 10*6/uL — ABNORMAL LOW (ref 4.20–5.70)

## 2012-11-25 MED ORDER — SODIUM CHLORIDE 0.9 % IJ SOLN
3.0000 mL | INTRAMUSCULAR | Status: DC | PRN
  Filled 2012-11-25: qty 10

## 2012-11-25 MED ORDER — SODIUM CHLORIDE 0.9 % IV SOLN
2400.0000 mg/m2 | INTRAVENOUS | Status: DC
  Administered 2012-11-25: 5100 mg via INTRAVENOUS
  Filled 2012-11-25: qty 102

## 2012-11-25 MED ORDER — HEPARIN SOD (PORK) LOCK FLUSH 100 UNIT/ML IV SOLN
500.0000 [IU] | Freq: Once | INTRAVENOUS | Status: DC | PRN
  Filled 2012-11-25: qty 5

## 2012-11-25 MED ORDER — SODIUM CHLORIDE 0.9 % IJ SOLN
10.0000 mL | INTRAMUSCULAR | Status: DC | PRN
  Filled 2012-11-25: qty 10

## 2012-11-25 MED ORDER — DEXAMETHASONE SODIUM PHOSPHATE 20 MG/5ML IJ SOLN
20.0000 mg | Freq: Once | INTRAMUSCULAR | Status: AC
  Administered 2012-11-25: 20 mg via INTRAVENOUS

## 2012-11-25 MED ORDER — ATROPINE SULFATE 1 MG/ML IJ SOLN
INTRAMUSCULAR | Status: AC
  Filled 2012-11-25: qty 1

## 2012-11-25 MED ORDER — FLUOROURACIL CHEMO INJECTION 2.5 GM/50ML
400.0000 mg/m2 | Freq: Once | INTRAVENOUS | Status: AC
  Administered 2012-11-25: 850 mg via INTRAVENOUS
  Filled 2012-11-25: qty 17

## 2012-11-25 MED ORDER — ALTEPLASE 2 MG IJ SOLR
2.0000 mg | Freq: Once | INTRAMUSCULAR | Status: DC | PRN
  Filled 2012-11-25: qty 2

## 2012-11-25 MED ORDER — ONDANSETRON 16 MG/50ML IVPB (CHCC)
16.0000 mg | Freq: Once | INTRAVENOUS | Status: AC
  Administered 2012-11-25: 16 mg via INTRAVENOUS

## 2012-11-25 MED ORDER — ATROPINE SULFATE 1 MG/ML IJ SOLN
0.5000 mg | Freq: Once | INTRAMUSCULAR | Status: AC | PRN
  Administered 2012-11-25: 14:00:00 via INTRAVENOUS

## 2012-11-25 MED ORDER — IRINOTECAN HCL CHEMO INJECTION 100 MG/5ML
160.0000 mg/m2 | Freq: Once | INTRAVENOUS | Status: AC
  Administered 2012-11-25: 340 mg via INTRAVENOUS
  Filled 2012-11-25: qty 17

## 2012-11-25 MED ORDER — DEXAMETHASONE SODIUM PHOSPHATE 20 MG/5ML IJ SOLN
INTRAMUSCULAR | Status: AC
  Filled 2012-11-25: qty 5

## 2012-11-25 MED ORDER — LEUCOVORIN CALCIUM INJECTION 350 MG
400.0000 mg/m2 | Freq: Once | INTRAVENOUS | Status: AC
  Administered 2012-11-25: 848 mg via INTRAVENOUS
  Filled 2012-11-25: qty 42.4

## 2012-11-25 MED ORDER — ONDANSETRON 16 MG/50ML IVPB (CHCC)
INTRAVENOUS | Status: AC
  Filled 2012-11-25: qty 16

## 2012-11-25 MED ORDER — HEPARIN SOD (PORK) LOCK FLUSH 100 UNIT/ML IV SOLN
250.0000 [IU] | Freq: Once | INTRAVENOUS | Status: DC | PRN
  Filled 2012-11-25: qty 5

## 2012-11-25 MED ORDER — DEXTROSE 5 % IV SOLN
Freq: Once | INTRAVENOUS | Status: AC
  Administered 2012-11-25: 11:00:00 via INTRAVENOUS

## 2012-11-25 MED ORDER — OXALIPLATIN CHEMO INJECTION 100 MG/20ML
85.0000 mg/m2 | Freq: Once | INTRAVENOUS | Status: AC
  Administered 2012-11-25: 180 mg via INTRAVENOUS
  Filled 2012-11-25: qty 36

## 2012-11-25 NOTE — Progress Notes (Signed)
This office note has been dictated.

## 2012-11-25 NOTE — Patient Instructions (Signed)
[facility] Discharge Instructions for Patients Receiving Chemotherapy  Today you received the following chemotherapy agents Oxaliplatin, Irinotecan, 5FU, Leucovorin  To help prevent nausea and vomiting after your treatment, we encourage you to take your nausea medication   1) Zofran  (Ondansetron) 8 mg by mouth one in am and one in pm starting the day after chemotherapy.  Take this for 3 days in a row then twice daily as needed for moderate nausea.  2) Compazine  (Prochlorperazine) 10 mg by mouth every 6 hours as needed for nausea or vomiting. May start the day of chemo and take with other medications.   3) Decadron  (Dexamethasone 4 mg)  Take 2 tablets by mouth twice daily with a meal starting the day after chemotherapy.  Take this for 3 days then stop.    4) Ativan (Lorazepam)- take 1 mg tablet by mouth or under tongue every 8 hours for nausea, anxiety, or sleep.  May start the day of chemo and take with other medications.   If you develop nausea and vomiting that is not controlled by your nausea medication, call the clinic [phone] If it is after clinic hours your family physician or the after hours number for the clinic or go to the Emergency Department.   BELOW ARE SYMPTOMS THAT SHOULD BE REPORTED IMMEDIATELY:  *FEVER GREATER THAN 100.5 F  *CHILLS WITH OR WITHOUT FEVER  NAUSEA AND VOMITING THAT IS NOT CONTROLLED WITH YOUR NAUSEA MEDICATION  *UNUSUAL SHORTNESS OF BREATH  *UNUSUAL BRUISING OR BLEEDING  TENDERNESS IN MOUTH AND THROAT WITH OR WITHOUT PRESENCE OF ULCERS  *URINARY PROBLEMS  *BOWEL PROBLEMS  UNUSUAL RASH Items with * indicate a potential emergency and should be followed up as soon as possible.  One of the nurses will contact you 24 hours after your treatment. Please let the nurse know about any problems that you may have experienced. Feel free to call the clinic you have any questions or concerns. The clinic phone number is [phone]   I  have been informed and understand all the instructions given to me. I know to contact the clinic, my physician, or go to the Emergency Department if any problems should occur. I do not have any questions at this time, but understand that I may call the clinic during office hours   should I have any questions or need assistance in obtaining follow up care.    __________________________________________  _____________  __________ Signature of Patient or Authorized Representative            Date                   Time    __________________________________________ Nurse's Signature    

## 2012-11-26 LAB — CANCER ANTIGEN 19-9: CA 19-9: 44006.2 U/mL — ABNORMAL HIGH (ref <35.0)

## 2012-11-27 ENCOUNTER — Ambulatory Visit (HOSPITAL_BASED_OUTPATIENT_CLINIC_OR_DEPARTMENT_OTHER): Payer: BC Managed Care – PPO

## 2012-11-27 ENCOUNTER — Ambulatory Visit: Payer: BC Managed Care – PPO | Admitting: Hematology & Oncology

## 2012-11-27 ENCOUNTER — Ambulatory Visit: Payer: BC Managed Care – PPO

## 2012-11-27 ENCOUNTER — Other Ambulatory Visit: Payer: BC Managed Care – PPO | Admitting: Lab

## 2012-11-27 DIAGNOSIS — C259 Malignant neoplasm of pancreas, unspecified: Secondary | ICD-10-CM

## 2012-11-27 MED ORDER — HEPARIN SOD (PORK) LOCK FLUSH 100 UNIT/ML IV SOLN
500.0000 [IU] | Freq: Once | INTRAVENOUS | Status: AC | PRN
  Administered 2012-11-27: 500 [IU]
  Filled 2012-11-27: qty 5

## 2012-11-27 MED ORDER — SODIUM CHLORIDE 0.9 % IJ SOLN
10.0000 mL | INTRAMUSCULAR | Status: DC | PRN
  Administered 2012-11-27: 10 mL
  Filled 2012-11-27: qty 10

## 2012-11-27 NOTE — Patient Instructions (Signed)
Fluorouracil, 5-FU injection What is this medicine? FLUOROURACIL, 5-FU (flure oh YOOR a sil) is a chemotherapy drug. It slows the growth of cancer cells. This medicine is used to treat many types of cancer like breast cancer, colon or rectal cancer, pancreatic cancer, and stomach cancer. This medicine may be used for other purposes; ask your health care provider or pharmacist if you have questions. What should I tell my health care provider before I take this medicine? They need to know if you have any of these conditions: -blood disorders -dihydropyrimidine dehydrogenase (DPD) deficiency -infection (especially a virus infection such as chickenpox, cold sores, or herpes) -kidney disease -liver disease -malnourished, poor nutrition -recent or ongoing radiation therapy -an unusual or allergic reaction to fluorouracil, other chemotherapy, other medicines, foods, dyes, or preservatives -pregnant or trying to get pregnant -breast-feeding How should I use this medicine? This drug is given as an infusion or injection into a vein. It is administered in a hospital or clinic by a specially trained health care professional. Talk to your pediatrician regarding the use of this medicine in children. Special care may be needed. Overdosage: If you think you have taken too much of this medicine contact a poison control center or emergency room at once. NOTE: This medicine is only for you. Do not share this medicine with others. What if I miss a dose? It is important not to miss your dose. Call your doctor or health care professional if you are unable to keep an appointment. What may interact with this medicine? -allopurinol -cimetidine -dapsone -digoxin -hydroxyurea -leucovorin -levamisole -medicines for seizures like ethotoin, fosphenytoin, phenytoin -medicines to increase blood counts like filgrastim, pegfilgrastim, sargramostim -medicines that treat or prevent blood clots like warfarin,  enoxaparin, and dalteparin -methotrexate -metronidazole -pyrimethamine -some other chemotherapy drugs like busulfan, cisplatin, estramustine, vinblastine -trimethoprim -trimetrexate -vaccines Talk to your doctor or health care professional before taking any of these medicines: -acetaminophen -aspirin -ibuprofen -ketoprofen -naproxen This list may not describe all possible interactions. Give your health care provider a list of all the medicines, herbs, non-prescription drugs, or dietary supplements you use. Also tell them if you smoke, drink alcohol, or use illegal drugs. Some items may interact with your medicine. What should I watch for while using this medicine? Visit your doctor for checks on your progress. This drug may make you feel generally unwell. This is not uncommon, as chemotherapy can affect healthy cells as well as cancer cells. Report any side effects. Continue your course of treatment even though you feel ill unless your doctor tells you to stop. In some cases, you may be given additional medicines to help with side effects. Follow all directions for their use. Call your doctor or health care professional for advice if you get a fever, chills or sore throat, or other symptoms of a cold or flu. Do not treat yourself. This drug decreases your body's ability to fight infections. Try to avoid being around people who are sick. This medicine may increase your risk to bruise or bleed. Call your doctor or health care professional if you notice any unusual bleeding. Be careful brushing and flossing your teeth or using a toothpick because you may get an infection or bleed more easily. If you have any dental work done, tell your dentist you are receiving this medicine. Avoid taking products that contain aspirin, acetaminophen, ibuprofen, naproxen, or ketoprofen unless instructed by your doctor. These medicines may hide a fever. Do not become pregnant while taking this medicine. Women should    inform their doctor if they wish to become pregnant or think they might be pregnant. There is a potential for serious side effects to an unborn child. Talk to your health care professional or pharmacist for more information. Do not breast-feed an infant while taking this medicine. Men should inform their doctor if they wish to father a child. This medicine may lower sperm counts. Do not treat diarrhea with over the counter products. Contact your doctor if you have diarrhea that lasts more than 2 days or if it is severe and watery. This medicine can make you more sensitive to the sun. Keep out of the sun. If you cannot avoid being in the sun, wear protective clothing and use sunscreen. Do not use sun lamps or tanning beds/booths. What side effects may I notice from receiving this medicine? Side effects that you should report to your doctor or health care professional as soon as possible: -allergic reactions like skin rash, itching or hives, swelling of the face, lips, or tongue -low blood counts - this medicine may decrease the number of white blood cells, red blood cells and platelets. You may be at increased risk for infections and bleeding. -signs of infection - fever or chills, cough, sore throat, pain or difficulty passing urine -signs of decreased platelets or bleeding - bruising, pinpoint red spots on the skin, black, tarry stools, blood in the urine -signs of decreased red blood cells - unusually weak or tired, fainting spells, lightheadedness -breathing problems -changes in vision -chest pain -mouth sores -nausea and vomiting -pain, swelling, redness at site where injected -pain, tingling, numbness in the hands or feet -redness, swelling, or sores on hands or feet -stomach pain -unusual bleeding Side effects that usually do not require medical attention (report to your doctor or health care professional if they continue or are bothersome): -changes in finger or toe  nails -diarrhea -dry or itchy skin -hair loss -headache -loss of appetite -sensitivity of eyes to the light -stomach upset -unusually teary eyes This list may not describe all possible side effects. Call your doctor for medical advice about side effects. You may report side effects to FDA at 1-800-FDA-1088. Where should I keep my medicine? This drug is given in a hospital or clinic and will not be stored at home. NOTE: This sheet is a summary. It may not cover all possible information. If you have questions about this medicine, talk to your doctor, pharmacist, or health care provider.  2012, [employer]. (06/24/2007 1:53:16 PM)

## 2012-12-01 NOTE — Progress Notes (Signed)
DIAGNOSIS:  Metastatic pancreatic cancer.  CURRENT THERAPY:  The patient is status post cycle 1 of FOLFIRINOX.  INTERIM HISTORY:  Austin Escobar comes in for his followup.  He tolerated his FOLFIRINOX incredibly well.  He looks incredibly fit.  One would never think that he had a problem despite looking at him.  He had very few side effects.  He had no nausea or vomiting.  His diarrhea seems to be getting better.  He is on Creon.  He has not noted any leg swelling.  He has had no rashes.  There has been no cough or shortness breath.  There has been no mouth sores.  He has had no visual issues.  He has had no headache.  He has had no swallowing difficulties.  Overall, his performance status is ECOG 1.  PHYSICAL EXAMINATION:  General:  This is a well-developed, well- nourished white gentleman in no obvious distress.  Vital signs: Temperature of 98.2, pulse 78, respiratory rate 18, blood pressure 115/74.  Weight is 190 pounds.  Head and neck:  Normocephalic, atraumatic skull.  There are no ocular or oral lesions.  There are no palpable cervical or supraclavicular lymph nodes.  Lungs:  Clear bilaterally.  Cardiac:  Regular rate and rhythm with a normal S1 and S2. There are no murmurs, rubs or bruits.  Abdomen:  Soft.  He has good bowel sounds.  There is no palpable abdominal mass.  There is no palpable hepatosplenomegaly.  Extremities:  No clubbing, cyanosis or edema.  He has good strength in his legs.  He has good range of motion of his joints.  No venous cord is noted in his legs.  Skin:  No rashes, ecchymosis, or petechia.  Neurological:  No focal neurological deficits.  LABORATORY STUDIES:  White cell count is 5, hemoglobin 12.6, hematocrit 38.7, platelet count 100,000.  IMPRESSION:  Austin Escobar is a very nice 57 year old gentleman with metastatic pancreatic cancer.  We tried him initially on Gemzar/Abraxane.  He progressed on this.  We now have him on FOLFIRINOX. I am just glad  that he is tolerating the treatment well.  He has really done well.  Side effects are minimal, if any.  We will go ahead and treat him today.  I will plan for a followup CT scan after his 3rd cycle of treatment.  We will check his CA19-9 with his next cycle of therapy.  I will see him back in 2 weeks.    ______________________________ Josph Macho, M.D. PRE/MEDQ  D:  11/25/2012  T:  12/01/2012  Job:  [id number]

## 2012-12-09 ENCOUNTER — Ambulatory Visit (HOSPITAL_BASED_OUTPATIENT_CLINIC_OR_DEPARTMENT_OTHER): Payer: BC Managed Care – PPO

## 2012-12-09 ENCOUNTER — Other Ambulatory Visit (HOSPITAL_BASED_OUTPATIENT_CLINIC_OR_DEPARTMENT_OTHER): Payer: BC Managed Care – PPO | Admitting: Lab

## 2012-12-09 ENCOUNTER — Ambulatory Visit (HOSPITAL_BASED_OUTPATIENT_CLINIC_OR_DEPARTMENT_OTHER): Payer: BC Managed Care – PPO | Admitting: Hematology & Oncology

## 2012-12-09 VITALS — BP 132/79 | HR 62 | Temp 98.3°F | Resp 18 | Ht 71.0 in | Wt 188.0 lb

## 2012-12-09 DIAGNOSIS — C259 Malignant neoplasm of pancreas, unspecified: Secondary | ICD-10-CM

## 2012-12-09 DIAGNOSIS — R197 Diarrhea, unspecified: Secondary | ICD-10-CM

## 2012-12-09 DIAGNOSIS — Z5111 Encounter for antineoplastic chemotherapy: Secondary | ICD-10-CM

## 2012-12-09 LAB — CBC WITH DIFFERENTIAL (CANCER CENTER ONLY)
BASO%: 0.5 % (ref 0.0–2.0)
EOS%: 1 % (ref 0.0–7.0)
HCT: 36.7 % — ABNORMAL LOW (ref 38.7–49.9)
LYMPH%: 20.9 % (ref 14.0–48.0)
MCH: 31.8 pg (ref 28.0–33.4)
MCV: 97 fL (ref 82–98)
MONO#: 0.6 10*3/uL (ref 0.1–0.9)
NEUT#: 2.4 10*3/uL (ref 1.5–6.5)
NEUT%: 61.6 % (ref 40.0–80.0)
Platelets: 119 10*3/uL — ABNORMAL LOW (ref 145–400)
RDW: 14.8 % (ref 11.1–15.7)
WBC: 3.9 10*3/uL — ABNORMAL LOW (ref 4.0–10.0)

## 2012-12-09 MED ORDER — DEXTROSE 5 % IV SOLN
400.0000 mg/m2 | Freq: Once | INTRAVENOUS | Status: AC
  Administered 2012-12-09: 848 mg via INTRAVENOUS
  Filled 2012-12-09: qty 42.4

## 2012-12-09 MED ORDER — ATROPINE SULFATE 1 MG/ML IJ SOLN
0.5000 mg | Freq: Once | INTRAMUSCULAR | Status: AC | PRN
  Administered 2012-12-09: 1 mg via INTRAVENOUS

## 2012-12-09 MED ORDER — ATROPINE SULFATE 1 MG/ML IJ SOLN
INTRAMUSCULAR | Status: AC
  Filled 2012-12-09: qty 1

## 2012-12-09 MED ORDER — SODIUM CHLORIDE 0.9 % IJ SOLN
10.0000 mL | INTRAMUSCULAR | Status: DC | PRN
Start: 2012-12-09 — End: 2012-12-09
  Filled 2012-12-09: qty 10

## 2012-12-09 MED ORDER — DEXAMETHASONE SODIUM PHOSPHATE 20 MG/5ML IJ SOLN
20.0000 mg | Freq: Once | INTRAMUSCULAR | Status: AC
  Administered 2012-12-09: 20 mg via INTRAVENOUS

## 2012-12-09 MED ORDER — SODIUM CHLORIDE 0.9 % IV SOLN
2400.0000 mg/m2 | INTRAVENOUS | Status: DC
  Administered 2012-12-09: 5100 mg via INTRAVENOUS
  Filled 2012-12-09: qty 102

## 2012-12-09 MED ORDER — DEXTROSE 5 % IV SOLN
Freq: Once | INTRAVENOUS | Status: AC
  Administered 2012-12-09: 10:00:00 via INTRAVENOUS

## 2012-12-09 MED ORDER — IRINOTECAN HCL CHEMO INJECTION 100 MG/5ML
160.0000 mg/m2 | Freq: Once | INTRAVENOUS | Status: AC
  Administered 2012-12-09: 340 mg via INTRAVENOUS
  Filled 2012-12-09: qty 5.67

## 2012-12-09 MED ORDER — FLUOROURACIL CHEMO INJECTION 2.5 GM/50ML
400.0000 mg/m2 | Freq: Once | INTRAVENOUS | Status: AC
  Administered 2012-12-09: 850 mg via INTRAVENOUS
  Filled 2012-12-09: qty 17

## 2012-12-09 MED ORDER — DEXAMETHASONE SODIUM PHOSPHATE 20 MG/5ML IJ SOLN
INTRAMUSCULAR | Status: AC
  Filled 2012-12-09: qty 5

## 2012-12-09 MED ORDER — ONDANSETRON 16 MG/50ML IVPB (CHCC)
16.0000 mg | Freq: Once | INTRAVENOUS | Status: AC
  Administered 2012-12-09: 16 mg via INTRAVENOUS

## 2012-12-09 MED ORDER — HEPARIN SOD (PORK) LOCK FLUSH 100 UNIT/ML IV SOLN
500.0000 [IU] | Freq: Once | INTRAVENOUS | Status: DC | PRN
  Filled 2012-12-09: qty 5

## 2012-12-09 MED ORDER — ONDANSETRON 16 MG/50ML IVPB (CHCC)
INTRAVENOUS | Status: AC
  Filled 2012-12-09: qty 16

## 2012-12-09 MED ORDER — OXALIPLATIN CHEMO INJECTION 100 MG/20ML
85.0000 mg/m2 | Freq: Once | INTRAVENOUS | Status: AC
  Administered 2012-12-09: 180 mg via INTRAVENOUS
  Filled 2012-12-09: qty 36

## 2012-12-09 NOTE — Progress Notes (Signed)
This office note has been dictated.

## 2012-12-09 NOTE — Patient Instructions (Signed)
 [facility] Center Discharge Instructions for Patients Receiving Chemotherapy  Today you received the following chemotherapy agents Oxaliplatin, Irinotecan, 5FU, Leucovorin  To help prevent nausea and vomiting after your treatment, we encourage you to take your nausea medication   1) Zofran  (Ondansetron) 8 mg by mouth one in am and one in pm starting the day after chemotherapy.  Take this for 3 days in a row then twice daily as needed for moderate nausea.  2) Compazine  (Prochlorperazine) 10 mg by mouth every 6 hours as needed for nausea or vomiting. May start the day of chemo and take with other medications.   3) Decadron  (Dexamethasone 4 mg)  Take 2 tablets by mouth twice daily with a meal starting the day after chemotherapy.  Take this for 3 days then stop.    4) Ativan (Lorazepam)- take 1 mg tablet by mouth or under tongue every 8 hours for nausea, anxiety, or sleep.  May start the day of chemo and take with other medications.   If you develop nausea and vomiting that is not controlled by your nausea medication, call the clinic [phone] If it is after clinic hours your family physician or the after hours number for the clinic or go to the Emergency Department.   BELOW ARE SYMPTOMS THAT SHOULD BE REPORTED IMMEDIATELY:  *FEVER GREATER THAN 100.5 F  *CHILLS WITH OR WITHOUT FEVER  NAUSEA AND VOMITING THAT IS NOT CONTROLLED WITH YOUR NAUSEA MEDICATION  *UNUSUAL SHORTNESS OF BREATH  *UNUSUAL BRUISING OR BLEEDING  TENDERNESS IN MOUTH AND THROAT WITH OR WITHOUT PRESENCE OF ULCERS  *URINARY PROBLEMS  *BOWEL PROBLEMS  UNUSUAL RASH Items with * indicate a potential emergency and should be followed up as soon as possible.  One of the nurses will contact you 24 hours after your treatment. Please let the nurse know about any problems that you may have experienced. Feel free to call the clinic you have any questions or concerns. The clinic phone number is [phone]   I  have been informed and understand all the instructions given to me. I know to contact the clinic, my physician, or go to the Emergency Department if any problems should occur. I do not have any questions at this time, but understand that I may call the clinic during office hours   should I have any questions or need assistance in obtaining follow up care.    __________________________________________  _____________  __________ Signature of Patient or Authorized Representative            Date                   Time    __________________________________________ Nurse's Signature

## 2012-12-10 LAB — COMPREHENSIVE METABOLIC PANEL
ALT: 26 U/L (ref 0–53)
Alkaline Phosphatase: 87 U/L (ref 39–117)
CO2: 24 mEq/L (ref 19–32)
Potassium: 4.1 mEq/L (ref 3.5–5.3)
Sodium: 142 mEq/L (ref 135–145)
Total Bilirubin: 0.4 mg/dL (ref 0.3–1.2)
Total Protein: 6.2 g/dL (ref 6.0–8.3)

## 2012-12-10 NOTE — Progress Notes (Signed)
DIAGNOSIS:  Metastatic pancreatic cancer.  CURRENT THERAPY:  Status post 2 cycles of FOLFIRINOX.  INTERIM HISTORY:  Austin Escobar comes in for his followup.  He is doing well.  He has actually tolerated chemotherapy quite nicely.  He still has some diarrhea.  This is helped a little bit by the Creon.  He has had no abdominal pain.  He has had no leg swelling.  There have been no rashes.  Unfortunately, his last CA-19-9 was still on the way up.  He has had a little bit of cough.  It has nonproductive.  He has had no headache.  He has had no fevers, sweats or chills.  Overall, his performance status is ECOG 1.  PHYSICAL EXAMINATION:  GENERAL:  This is a well-developed, well- nourished white gentleman in no obvious distress.  VITAL SIGNS: Temperature of 98.3, pulse 63, respiratory rate 18, blood pressure 132/79.  Weight is 188 pounds.  HEAD AND NECK EXAM:  Normocephalic, atraumatic skull.  There are no ocular or oral lesions.  There are no palpable cervical or supraclavicular lymph nodes.  LUNGS:  Clear bilaterally.  CARDIAC EXAM:  Regular rate and rhythm with a normal S1 and S2.  There are no murmurs, rubs or bruits.  ABDOMEN:  Soft.  He has good bowel sounds.  There is no fluid wave.  There is no palpable abdominal mass.  There is no palpable hepatosplenomegaly.  EXTREMITIES: No clubbing, cyanosis or edema.  NEUROLOGICAL EXAM:  No focal neurological deficits.  LABORATORY STUDIES:  White cell count is 3.8, hemoglobin 12, hematocrit 36.7, platelet count 119.  IMPRESSION:  Austin Escobar is a very nice 57 year old gentleman with metastatic pancreatic cancer.  He progressed on first-line therapy with Abraxane/Gemzar.  We now have him on FOLFIRINOX.  He will get his 3rd cycle today.  Hopefully, we will see him back in 2 weeks and his CT scan will be better.  If not, then clearly, will have to be looking at __________ protocol through clinical trials.  We will go and get the scan  set up the week of October 20th.  We will get him back afterwards and hopefully continue on chemotherapy with FOLFIRINOX.    ______________________________ Josph Macho, M.D. PRE/MEDQ  D:  12/09/2012  T:  12/10/2012  Job:  [id number]

## 2012-12-11 ENCOUNTER — Ambulatory Visit (HOSPITAL_BASED_OUTPATIENT_CLINIC_OR_DEPARTMENT_OTHER): Payer: BC Managed Care – PPO

## 2012-12-11 VITALS — BP 128/75 | HR 75 | Temp 98.1°F | Resp 20

## 2012-12-11 DIAGNOSIS — Z452 Encounter for adjustment and management of vascular access device: Secondary | ICD-10-CM

## 2012-12-11 DIAGNOSIS — C259 Malignant neoplasm of pancreas, unspecified: Secondary | ICD-10-CM

## 2012-12-11 MED ORDER — SODIUM CHLORIDE 0.9 % IJ SOLN
10.0000 mL | INTRAMUSCULAR | Status: DC | PRN
  Administered 2012-12-11: 10 mL
  Filled 2012-12-11: qty 10

## 2012-12-11 MED ORDER — HEPARIN SOD (PORK) LOCK FLUSH 100 UNIT/ML IV SOLN
500.0000 [IU] | Freq: Once | INTRAVENOUS | Status: AC | PRN
  Administered 2012-12-11: 500 [IU]
  Filled 2012-12-11: qty 5

## 2012-12-11 NOTE — Patient Instructions (Signed)
Implanted Port Instructions  An implanted port is a central line that has a round shape and is placed under the skin. It is used for long-term IV (intravenous) access for:  · Medicine.  · Fluids.  · Liquid nutrition, such as TPN (total parenteral nutrition).  · Blood samples.  Ports can be placed:  · In the chest area just below the collarbone (this is the most common place.)  · In the arms.  · In the belly (abdomen) area.  · In the legs.  PARTS OF THE PORT  A port has 2 main parts:  · The reservoir. The reservoir is round, disc-shaped, and will be a small, raised area under your skin.  · The reservoir is the part where a needle is inserted (accessed) to either give medicines or to draw blood.  · The catheter. The catheter is a long, slender tube that extends from the reservoir. The catheter is placed into a large vein.  · Medicine that is inserted into the reservoir goes into the catheter and then into the vein.  INSERTION OF THE PORT  · The port is surgically placed in either an operating room or in a procedural area (interventional radiology).  · Medicine may be given to help you relax during the procedure.  · The skin where the port will be inserted is numbed (local anesthetic).  · 1 or 2 small cuts (incisions) will be made in the skin to insert the port.  · The port can be used after it has been inserted.  INCISION SITE CARE  · The incision site may have small adhesive strips on it. This helps keep the incision site closed. Sometimes, no adhesive strips are placed. Instead of adhesive strips, a special kind of surgical glue is used to keep the incision closed.  · If adhesive strips were placed on the incision sites, do not take them off. They will fall off on their own.  · The incision site may be sore for 1 to 2 days. Pain medicine can help.  · Do not get the incision site wet. Bathe or shower as directed by your caregiver.  · The incision site should heal in 5 to 7 days. A small scar may form after the  incision has healed.  ACCESSING THE PORT  Special steps must be taken to access the port:  · Before the port is accessed, a numbing cream can be placed on the skin. This helps numb the skin over the port site.  · A sterile technique is used to access the port.  · The port is accessed with a needle. Only "non-coring" port needles should be used to access the port. Once the port is accessed, a blood return should be checked. This helps ensure the port is in the vein and is not clogged (clotted).  · If your caregiver believes your port should remain accessed, a clear (transparent) bandage will be placed over the needle site. The bandage and needle will need to be changed every week or as directed by your caregiver.  · Keep the bandage covering the needle clean and dry. Do not get it wet. Follow your caregiver's instructions on how to take a shower or bath when the port is accessed.  · If your port does not need to stay accessed, no bandage is needed over the port.  FLUSHING THE PORT  Flushing the port keeps it from getting clogged. How often the port is flushed depends on:  · If a   constant infusion is running. If a constant infusion is running, the port may not need to be flushed.  · If intermittent medicines are given.  · If the port is not being used.  For intermittent medicines:  · The port will need to be flushed:  · After medicines have been given.  · After blood has been drawn.  · As part of routine maintenance.  · A port is normally flushed with:  · Normal saline.  · Heparin.  · Follow your caregiver's advice on how often, how much, and the type of flush to use on your port.  IMPORTANT PORT INFORMATION  · Tell your caregiver if you are allergic to heparin.  · After your port is placed, you will get a manufacturer's information card. The card has information about your port. Keep this card with you at all times.  · There are many types of ports available. Know what kind of port you have.  · In case of an  emergency, it may be helpful to wear a medical alert bracelet. This can help alert health care workers that you have a port.  · The port can stay in for as long as your caregiver believes it is necessary.  · When it is time for the port to come out, surgery will be done to remove it. The surgery will be similar to how the port was put in.  · If you are in the hospital or clinic:  · Your port will be taken care of and flushed by a nurse.  · If you are at home:  · A home health care nurse may give medicines and take care of the port.  · You or a family member can get special training and directions for giving medicine and taking care of the port at home.  SEEK IMMEDIATE MEDICAL CARE IF:   · Your port does not flush or you are unable to get a blood return.  · New drainage or pus is coming from the incision.  · A bad smell is coming from the incision site.  · You develop swelling or increased redness at the incision site.  · You develop increased swelling or pain at the port site.  · You develop swelling or pain in the surrounding skin near the port.  · You have an oral temperature above 102° F (38.9° C), not controlled by medicine.  MAKE SURE YOU:   · Understand these instructions.  · Will watch your condition.  · Will get help right away if you are not doing well or get worse.  Document Released: 02/18/2005 Document Revised: 05/13/2011 Document Reviewed: 05/12/2008  [employer]® Patient Information ©2014 [employer].

## 2012-12-23 ENCOUNTER — Other Ambulatory Visit: Payer: Self-pay

## 2012-12-23 ENCOUNTER — Ambulatory Visit (HOSPITAL_BASED_OUTPATIENT_CLINIC_OR_DEPARTMENT_OTHER)
Admission: RE | Admit: 2012-12-23 | Discharge: 2012-12-23 | Disposition: A | Discharge status: Home / Self Care | Payer: BC Managed Care – PPO | Source: Ambulatory Visit | Attending: Hematology & Oncology | Admitting: Hematology & Oncology

## 2012-12-23 ENCOUNTER — Inpatient Hospital Stay (HOSPITAL_COMMUNITY)
Admission: EM | Admit: 2012-12-23 | Discharge: 2012-12-24 | DRG: 176 | Disposition: A | Discharge status: Home / Self Care | Payer: BC Managed Care – PPO | Attending: Internal Medicine | Admitting: Internal Medicine

## 2012-12-23 ENCOUNTER — Encounter (HOSPITAL_BASED_OUTPATIENT_CLINIC_OR_DEPARTMENT_OTHER): Payer: Self-pay

## 2012-12-23 ENCOUNTER — Encounter (HOSPITAL_COMMUNITY): Payer: Self-pay | Admitting: Emergency Medicine

## 2012-12-23 ENCOUNTER — Ambulatory Visit (HOSPITAL_BASED_OUTPATIENT_CLINIC_OR_DEPARTMENT_OTHER): Payer: BC Managed Care – PPO

## 2012-12-23 VITALS — BP 145/95 | HR 82 | Temp 97.4°F | Resp 18

## 2012-12-23 DIAGNOSIS — I2699 Other pulmonary embolism without acute cor pulmonale: Secondary | ICD-10-CM | POA: Insufficient documentation

## 2012-12-23 DIAGNOSIS — R7309 Other abnormal glucose: Secondary | ICD-10-CM | POA: Diagnosis present

## 2012-12-23 DIAGNOSIS — Z9221 Personal history of antineoplastic chemotherapy: Secondary | ICD-10-CM

## 2012-12-23 DIAGNOSIS — C259 Malignant neoplasm of pancreas, unspecified: Secondary | ICD-10-CM

## 2012-12-23 DIAGNOSIS — D638 Anemia in other chronic diseases classified elsewhere: Secondary | ICD-10-CM | POA: Diagnosis present

## 2012-12-23 DIAGNOSIS — R739 Hyperglycemia, unspecified: Secondary | ICD-10-CM | POA: Diagnosis present

## 2012-12-23 DIAGNOSIS — I4891 Unspecified atrial fibrillation: Secondary | ICD-10-CM | POA: Diagnosis present

## 2012-12-23 DIAGNOSIS — C250 Malignant neoplasm of head of pancreas: Secondary | ICD-10-CM

## 2012-12-23 DIAGNOSIS — E871 Hypo-osmolality and hyponatremia: Secondary | ICD-10-CM | POA: Diagnosis present

## 2012-12-23 DIAGNOSIS — D696 Thrombocytopenia, unspecified: Secondary | ICD-10-CM | POA: Diagnosis present

## 2012-12-23 DIAGNOSIS — T451X5A Adverse effect of antineoplastic and immunosuppressive drugs, initial encounter: Secondary | ICD-10-CM | POA: Diagnosis present

## 2012-12-23 DIAGNOSIS — K7689 Other specified diseases of liver: Secondary | ICD-10-CM | POA: Insufficient documentation

## 2012-12-23 DIAGNOSIS — D6481 Anemia due to antineoplastic chemotherapy: Secondary | ICD-10-CM | POA: Diagnosis present

## 2012-12-23 DIAGNOSIS — I059 Rheumatic mitral valve disease, unspecified: Secondary | ICD-10-CM

## 2012-12-23 DIAGNOSIS — Z79899 Other long term (current) drug therapy: Secondary | ICD-10-CM | POA: Insufficient documentation

## 2012-12-23 DIAGNOSIS — I10 Essential (primary) hypertension: Secondary | ICD-10-CM | POA: Diagnosis present

## 2012-12-23 DIAGNOSIS — Z452 Encounter for adjustment and management of vascular access device: Secondary | ICD-10-CM

## 2012-12-23 DIAGNOSIS — E876 Hypokalemia: Secondary | ICD-10-CM | POA: Diagnosis present

## 2012-12-23 DIAGNOSIS — D6959 Other secondary thrombocytopenia: Secondary | ICD-10-CM | POA: Diagnosis present

## 2012-12-23 DIAGNOSIS — D649 Anemia, unspecified: Secondary | ICD-10-CM | POA: Diagnosis present

## 2012-12-23 DIAGNOSIS — I2692 Saddle embolus of pulmonary artery without acute cor pulmonale: Principal | ICD-10-CM | POA: Diagnosis present

## 2012-12-23 LAB — BASIC METABOLIC PANEL
BUN: 9 mg/dL (ref 6–23)
Calcium: 8.9 mg/dL (ref 8.4–10.5)
Creatinine, Ser: 0.79 mg/dL (ref 0.50–1.35)
GFR calc non Af Amer: >90 mL/min (ref >90)
Glucose, Bld: 146 mg/dL — ABNORMAL HIGH (ref 70–99)
Potassium: 3.4 mEq/L — ABNORMAL LOW (ref 3.5–5.1)

## 2012-12-23 LAB — HEPATIC FUNCTION PANEL
AST: 28 U/L (ref 0–37)
Alkaline Phosphatase: 104 U/L (ref 39–117)
Bilirubin, Direct: <0.1 mg/dL (ref 0.0–0.3)
Total Bilirubin: 0.3 mg/dL (ref 0.3–1.2)
Total Protein: 6.5 g/dL (ref 6.0–8.3)

## 2012-12-23 LAB — CBC
HCT: 34.4 % — ABNORMAL LOW (ref 39.0–52.0)
Hemoglobin: 11.8 g/dL — ABNORMAL LOW (ref 13.0–17.0)
MCH: 31.9 pg (ref 26.0–34.0)
MCHC: 34.3 g/dL (ref 30.0–36.0)
MCV: 93 fL (ref 78.0–100.0)
RBC: 3.7 MIL/uL — ABNORMAL LOW (ref 4.22–5.81)

## 2012-12-23 LAB — PROTIME-INR: INR: 1.26 (ref 0.00–1.49)

## 2012-12-23 LAB — HEPARIN LEVEL (UNFRACTIONATED): Heparin Unfractionated: 0.39 IU/mL (ref 0.30–0.70)

## 2012-12-23 MED ORDER — SODIUM CHLORIDE 0.9 % IJ SOLN
10.0000 mL | INTRAMUSCULAR | Status: DC | PRN
  Administered 2012-12-23: 10 mL via INTRAVENOUS
  Filled 2012-12-23: qty 10

## 2012-12-23 MED ORDER — DIPHENOXYLATE-ATROPINE 2.5-0.025 MG PO TABS: 1.0000 | ORAL_TABLET | Freq: Four times a day (QID) | ORAL | Status: DC | PRN

## 2012-12-23 MED ORDER — HYDROCODONE-ACETAMINOPHEN 5-325 MG PO TABS: 1.0000 | ORAL_TABLET | ORAL | Status: DC | PRN

## 2012-12-23 MED ORDER — LORAZEPAM 1 MG PO TABS: 1.0000 mg | ORAL_TABLET | Freq: Four times a day (QID) | ORAL | Status: DC | PRN

## 2012-12-23 MED ORDER — POTASSIUM CHLORIDE CRYS ER 20 MEQ PO TBCR
40.0000 meq | EXTENDED_RELEASE_TABLET | Freq: Once | ORAL | Status: AC
  Administered 2012-12-23: 16:00:00 40 meq via ORAL
  Filled 2012-12-23: qty 2

## 2012-12-23 MED ORDER — VITAMIN B-6 50 MG PO TABS
50.0000 mg | ORAL_TABLET | Freq: Two times a day (BID) | ORAL | Status: DC
  Administered 2012-12-23 – 2012-12-24 (×2): 50 mg via ORAL
  Filled 2012-12-23 (×3): qty 1

## 2012-12-23 MED ORDER — SODIUM CHLORIDE 0.9 % IJ SOLN: 3.0000 mL | INTRAMUSCULAR | Status: DC | PRN

## 2012-12-23 MED ORDER — HEPARIN (PORCINE) IN NACL 100-0.45 UNIT/ML-% IJ SOLN
1400.0000 [IU]/h | INTRAMUSCULAR | Status: DC
  Administered 2012-12-23 – 2012-12-24 (×2): 1400 [IU]/h via INTRAVENOUS
  Filled 2012-12-23 (×5): qty 250

## 2012-12-23 MED ORDER — HEPARIN BOLUS VIA INFUSION
4500.0000 [IU] | Freq: Once | INTRAVENOUS | Status: AC
  Administered 2012-12-23: 4500 [IU] via INTRAVENOUS
  Filled 2012-12-23: qty 4500

## 2012-12-23 MED ORDER — POLYETHYLENE GLYCOL 3350 17 G PO PACK
17.0000 g | PACK | Freq: Every day | ORAL | Status: DC | PRN
  Filled 2012-12-23: qty 1

## 2012-12-23 MED ORDER — ACETAMINOPHEN 325 MG PO TABS: 650.0000 mg | ORAL_TABLET | Freq: Four times a day (QID) | ORAL | Status: DC | PRN

## 2012-12-23 MED ORDER — SODIUM CHLORIDE 0.9 % IJ SOLN: 3.0000 mL | Freq: Two times a day (BID) | INTRAMUSCULAR | Status: DC

## 2012-12-23 MED ORDER — IOHEXOL 300 MG/ML  SOLN
100.0000 mL | Freq: Once | INTRAMUSCULAR | Status: AC | PRN
  Administered 2012-12-23: 100 mL via INTRAVENOUS

## 2012-12-23 MED ORDER — ACETAMINOPHEN 650 MG RE SUPP: 650.0000 mg | Freq: Four times a day (QID) | RECTAL | Status: DC | PRN

## 2012-12-23 MED ORDER — ONDANSETRON HCL 4 MG PO TABS: 8.0000 mg | ORAL_TABLET | Freq: Three times a day (TID) | ORAL | Status: DC | PRN

## 2012-12-23 MED ORDER — SODIUM CHLORIDE 0.9 % IV SOLN: 250.0000 mL | INTRAVENOUS | Status: DC | PRN

## 2012-12-23 MED ORDER — HEPARIN SOD (PORK) LOCK FLUSH 100 UNIT/ML IV SOLN
500.0000 [IU] | Freq: Once | INTRAVENOUS | Status: AC
  Administered 2012-12-23: 500 [IU] via INTRAVENOUS
  Filled 2012-12-23: qty 5

## 2012-12-23 MED ORDER — RISAQUAD PO CAPS
3.0000 | ORAL_CAPSULE | Freq: Every day | ORAL | Status: DC
  Administered 2012-12-24: 3 via ORAL
  Filled 2012-12-23: qty 3

## 2012-12-23 MED ORDER — PANCRELIPASE (LIP-PROT-AMYL) 12000-38000 UNITS PO CPEP
1.0000 | ORAL_CAPSULE | Freq: Three times a day (TID) | ORAL | Status: DC
  Administered 2012-12-23 – 2012-12-24 (×4): 1 via ORAL
  Filled 2012-12-23 (×7): qty 1

## 2012-12-23 MED ORDER — OMEGA-3-ACID ETHYL ESTERS 1 G PO CAPS
2.0000 g | ORAL_CAPSULE | Freq: Every day | ORAL | Status: DC
  Administered 2012-12-24: 10:00:00 2 g via ORAL
  Filled 2012-12-23: qty 2

## 2012-12-23 NOTE — Progress Notes (Signed)
PHARMACY BRIEF NOTE -- Drug Level Result  Consult for:  IV Heparin Indication:  Pulmonary embolus  With the infusion of 1400 units/hr, the Heparin level drawn at 17:01 was reported as 0.39 units/ml.  This level is within the therapeutic range, 0.3-0.7 units/ml.  The patient's nurse has not seen any signs of bleeding.  Plan:  Continue the current infusion rate.  Continue daily CBC and Heparin level.  Polo Riley R.Ph. 12/23/2012 6:59 PM

## 2012-12-23 NOTE — Progress Notes (Signed)
Received report from ED RN. Patient assigned to [id number]. J.Lillianne Eick, RN

## 2012-12-23 NOTE — ED Provider Notes (Signed)
TIME SEEN: 10:21 AM  CHIEF COMPLAINT: Abnormal CT  HPI: Patient is a 57 y.o. male with a history of hypertension, pancreatic cancer currently on chemotherapy who presents to the emergency department with abnormal CT scan. Patient had a routine screening CT chest, abdomen and pelvis today to evaluate the effectiveness of his chemotherapy. His CT chest showed a saddle pulmonary embolus and he was sent to the ED. He denies any chest pain, shortness of breath, palpitations, lightheadedness. No prior history of PE or DVT. No lower extremity swelling or pain.  ROS: See HPI Constitutional: no fever  Eyes: no drainage  ENT: no runny nose   Cardiovascular:  no chest pain  Resp: no SOB  GI: no vomiting GU: no dysuria Integumentary: no rash  Allergy: no hives  Musculoskeletal: no leg swelling  Neurological: no slurred speech ROS otherwise negative  PAST MEDICAL HISTORY/PAST SURGICAL HISTORY:  Past Medical History  Diagnosis Date  . Cellulitis and abscess of trunk   . CELLULITIS, RIGHT LEG   . LOW BACK PAIN   . HTN (hypertension)   . Personal history of colonic adenomas 04/29/2012  . Afib   . ED (erectile dysfunction)   . Asthma     as a child- no problems after childhood  . 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 Pancreatic mass 06/25/2012    EUS  . Diverticulosis   . Pancreatic cancer   . Testicular cancer     MEDICATIONS:  Prior to Admission medications   Medication Sig Start Date End Date Taking? Authorizing Provider  bifidobacterium infantis (ALIGN) capsule Take 3 capsules by mouth daily.     Historical Provider, MD  dexamethasone (DECADRON) 4 MG tablet Take (2) tabs 8 mg twice daily with a meal starting the day after chemo x 3 days. 11/11/12   Josph Macho, MD  diphenoxylate-atropine (LOMOTIL) 2.5-0.025 MG per tablet Take 1 tablet by mouth 4 (four) times daily as needed for diarrhea or loose stools. 11/06/12   Josph Macho, MD  fish oil-omega-3 fatty acids 1000 MG capsule Take  2 g by mouth daily.    Historical Provider, MD  Hyaluronic Acid-Vitamin C (HYALURONIC ACID PO) Take 3 tablets by mouth daily.     Historical Provider, MD  lidocaine-prilocaine (EMLA) cream Apply topically as needed. Apply quarter sized amount of cream to portacath site at least one hour prior to chemotherapy appt.  Cover with saran wrap 11/06/12   Josph Macho, MD  LORazepam (ATIVAN) 1 MG tablet Take 1 tablet (1 mg total) by mouth every 8 (eight) hours. A needed for nausea, anxiety, or sleep 11/11/12   Josph Macho, MD  ondansetron (ZOFRAN) 8 MG tablet Take 1 tab twice daily starting the day after chemo x 3 days then twice daily as needed for nausea. 11/11/12   Josph Macho, MD  OVER THE COUNTER MEDICATION Take 2 tablets by mouth 3 (three) times daily. Dr Blacks super food    Historical Provider, MD  Pancrelipase, Lip-Prot-Amyl, 24000 UNITS CPEP Take 1 capsule (24,000 Units total) by mouth 4 (four) times daily. Take 1 prior to each meal 09/11/12   Josph Macho, MD  Pyridoxine HCl (VITAMIN B-6 CR PO) Take by mouth 2 (two) times daily.    Historical Provider, MD  tadalafil (CIALIS) 20 MG tablet Take 20 mg by mouth daily as needed.    Historical Provider, MD    ALLERGIES:  No Known Allergies  SOCIAL HISTORY:  History  Substance Use Topics  . Smoking status: Never Smoker   . Smokeless tobacco: Never Used  . Alcohol Use: No     Comment: occasional    FAMILY HISTORY: Family History  Problem Relation Age of Onset  . Hyperlipidemia Father   . Coronary artery disease Father   . Stroke Father   . Diabetes Father   . Liver disease Father     cirrhosis  . Kidney disease Father   . Colon cancer Neg Hx   . Esophageal cancer Neg Hx   . Rectal cancer Neg Hx   . Stomach cancer Neg Hx     EXAM: BP 137/94  Pulse 72  Temp(Src) 98 F (36.7 C) (Oral)  Resp 18  SpO2 100% CONSTITUTIONAL: Alert and oriented and responds appropriately to questions. Well-appearing; well-nourished HEAD:  Normocephalic EYES: Conjunctivae clear, PERRL ENT: normal nose; no rhinorrhea; moist mucous membranes; pharynx without lesions noted NECK: Supple, no meningismus, no LAD  CARD: RRR; S1 and S2 appreciated; no murmurs, no clicks, no rubs, no gallops RESP: Normal chest excursion without splinting or tachypnea; breath sounds clear and equal bilaterally; no wheezes, no rhonchi, no rales,  ABD/GI: Normal bowel sounds; non-distended; soft, non-tender, no rebound, no guarding BACK:  The back appears normal and is non-tender to palpation, there is no CVA tenderness EXT: Normal ROM in all joints; non-tender to palpation; no edema; normal capillary refill; no cyanosis    SKIN: Normal color for age and race; warm NEURO: Moves all extremities equally PSYCH: The patient's mood and manner are appropriate. Grooming and personal hygiene are appropriate.  MEDICAL DECISION MAKING: Pt with saddle pulmonary embolus on CT scan. He is currently hemodynamically stable and without complaints.  Will obtain labs and start heparin (no contraindications to anticoagulation).  Pt will need admission.  Oncologist is [facility] PCP is [name] with [facility]   Date: 12/23/2012 11:05 AM  Rate: 67  Rhythm: normal sinus rhythm  QRS Axis: LAD  Intervals: normal  ST/T Wave abnormalities: normal  Conduction Disutrbances: none  Narrative Interpretation: no new ST changes, unchanged compared to prior EKG; J point elevation in V2; LAD; no ectopy      ED PROGRESS: Labs unremarkable. Have started heparin. He is still hemodynamically stable. Spoke with hospitalist for admission.     Layla Maw Ward, DO 12/23/12 1240

## 2012-12-23 NOTE — Progress Notes (Signed)
UR completed 

## 2012-12-23 NOTE — Progress Notes (Signed)
Echocardiogram 2D Echocardiogram has been performed.  Austin Escobar 12/23/2012, 3:36 PM

## 2012-12-23 NOTE — ED Notes (Signed)
Pt states that he sent here from CT at [facility] due to blood clots in lungs.  Pt has pancreatic cancer.

## 2012-12-23 NOTE — ED Notes (Signed)
Patient has a urine specimen sitting on sink. NT [place] notified
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
## 2012-12-23 NOTE — Progress Notes (Signed)
ANTICOAGULATION CONSULT NOTE - Initial Consult  Pharmacy Consult for IV Heparin Indication: pulmonary embolus  No Known Allergies  Patient Measurements:   Weight 85.3 kg (12/09/12) Height 180 cm IBW 75 kg Heparin Dosing Weight: 85 kg  Vital Signs: Temp: 98 F (36.7 C) (10/22 1009) Temp src: Oral (10/22 1009) BP: 137/94 mmHg (10/22 1009) Pulse Rate: 72 (10/22 1009)  Labs: No results found for this basename: HGB, HCT, PLT, APTT, LABPROT, INR, HEPARINUNFRC, CREATININE, CKTOTAL, CKMB, TROPONINI,  in the last 72 hours  The CrCl is unknown because both a height and weight (above a minimum accepted value) are required for this calculation.   Medical History: Past Medical History  Diagnosis Date  . Cellulitis and abscess of trunk   . CELLULITIS, RIGHT LEG   . LOW BACK PAIN   . HTN (hypertension)   . Personal history of colonic adenomas 04/29/2012  . Afib   . ED (erectile dysfunction)   . Asthma     as a child- no problems after childhood  . 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 Pancreatic mass 06/25/2012    EUS  . Diverticulosis   . Pancreatic cancer   . Testicular cancer      Assessment: 63 yoM with history of pancreatitic cancer presents from [facility] with CT positive for PE.  Pharmacy consulted to begin IV Heparin.  Note patient completed cycle 3 of 4 of FOLFIRINOX chemotherapy 10/8.    Baseline labs: Hgb 11.8, Platelets 116K (note s/p chemo).  Per MD assessment, he is currently hemodynamically stable and without complaints.  No h/o renal dysfunction.  BMET in process.  Goal of Therapy:  Heparin level 0.3-0.7 units/ml Monitor platelets by anticoagulation protocol: Yes   Plan:   Heparin 4500 units IV bolus x 1 then start infusion at 1400 units/hr (14 mL/hr).  Check heparin level 6 hours following start of infusion at 1800.  Daily HL and CBC while on heparin.  Clance Boll 12/23/2012,11:07 AM

## 2012-12-24 DIAGNOSIS — I10 Essential (primary) hypertension: Secondary | ICD-10-CM

## 2012-12-24 DIAGNOSIS — D649 Anemia, unspecified: Secondary | ICD-10-CM

## 2012-12-24 DIAGNOSIS — C259 Malignant neoplasm of pancreas, unspecified: Secondary | ICD-10-CM

## 2012-12-24 LAB — CBC
MCH: 31 pg (ref 26.0–34.0)
MCHC: 33.1 g/dL (ref 30.0–36.0)
MCV: 93.8 fL (ref 78.0–100.0)
Platelets: 142 10*3/uL — ABNORMAL LOW (ref 150–400)
RBC: 3.84 MIL/uL — ABNORMAL LOW (ref 4.22–5.81)
RDW: 15.4 % (ref 11.5–15.5)

## 2012-12-24 LAB — BASIC METABOLIC PANEL
BUN: 8 mg/dL (ref 6–23)
Calcium: 9.5 mg/dL (ref 8.4–10.5)
GFR calc Af Amer: >90 mL/min (ref >90)
GFR calc non Af Amer: >90 mL/min (ref >90)
Potassium: 4.1 mEq/L (ref 3.5–5.1)
Sodium: 138 mEq/L (ref 135–145)

## 2012-12-24 LAB — HEPARIN LEVEL (UNFRACTIONATED): Heparin Unfractionated: 0.32 IU/mL (ref 0.30–0.70)

## 2012-12-24 MED ORDER — HEPARIN SOD (PORK) LOCK FLUSH 100 UNIT/ML IV SOLN
500.0000 [IU] | INTRAVENOUS | Status: AC | PRN
  Administered 2012-12-24: 16:00:00 500 [IU]

## 2012-12-24 MED ORDER — ENOXAPARIN SODIUM 120 MG/0.8ML ~~LOC~~ SOLN: 120.0000 mg | Freq: Every day | SUBCUTANEOUS | Status: DC

## 2012-12-24 MED ORDER — ENOXAPARIN (LOVENOX) PATIENT EDUCATION KIT
PACK | Freq: Once | Status: AC
  Administered 2012-12-24: 09:00:00
  Filled 2012-12-24: qty 1

## 2012-12-24 MED ORDER — ENOXAPARIN SODIUM 100 MG/ML ~~LOC~~ SOLN
1.0000 mg/kg | Freq: Two times a day (BID) | SUBCUTANEOUS | Status: DC
  Administered 2012-12-24: 85 mg via SUBCUTANEOUS
  Filled 2012-12-24 (×3): qty 1

## 2012-12-24 NOTE — Discharge Summary (Signed)
Physician Discharge Summary  Austin Escobar ZOX:[id number] DOB: 1956/02/23 DOA: 12/23/2012  PCP: Austin Lemons, DO  Admit date: 12/23/2012 Discharge date: 12/24/2012  Recommendations for Outpatient Follow-up:  1. Follow up with Dr. [name] and radiation oncology at already scheduled appointments.  Dr. [name] to continue to follow up for pulmonary embolism and ongoing anticoagulation.  2. Please repeat BMP and CBC to check creatinine and anemia while on blood thinner.    Discharge Diagnoses:  Principal Problem:   Saddle pulmonary embolus Active Problems:   HTN (hypertension)   Pancreatic cancer   Normocytic anemia   Thrombocytopenia, unspecified   Hyperglycemia   Hypokalemia   Hyponatremia   Discharge Condition: stable, improved  Diet recommendation:  regular  Wt Readings from Last 3 Encounters:  12/23/12 85.276 kg (188 lb)  12/09/12 85.276 kg (188 lb)  11/25/12 86.183 kg (190 lb)    History of present illness:  Austin Escobar is an 57 y.o. male with a PMH of metastatic pancreatic cancer who presented to the radiology department for a restaging CT scan of his chest, abdomen and pelvis to evaluate effectiveness of his prior chemotherapy, and was found to have an incidental saddle pulmonary embolism. The patient is largely asymptomatic. Specifically, he denies chest pain, shortness of breath, palpitations, lightheadedness, or lower extremity swelling. He reports that he had a bout of cellulitis a few months back, but had Doppler studies of his leg at that time which were negative. No history of prior thromboembolism. No recent travel.  Hospital Course:   [name] was hospitalized with a saddle pulmonary embolism.  He did not have a screening troponin or BNP, but his CT did not demonstrate evidence of right heart strain and his ECHO also showed normal RV structure and function.  His blood pressure was stable and therefore, he was not a candidate for tPA.  He was started on a  heparin gtt which he continued for 24 hours, during which time, he remained hemodynamically stable.  When it was clear he would not need further procedures, he was transferred to lovenox injections.  He received education by the nursing staff on how to perform the injections and he has previously had lovenox injections at home which were administered by his late wife.  He demonstrated proficiency.  The case manager verified that he has no copay for lovenox.  He should continue anticoagulation indefinitely until his cancer is cured or unless he has bleeding complications.  He will follow up with Dr. [name], his Hematology/Oncology MD for ongoing care.    HTN, controlled with diet. Pancreatic cancer, stable.  Continuing FOLFOX and XRT.  Restaging CT did not demonstrate progression of disease. Normocytic anemia and thrombocytopenia, likely due to chemotherapy and chronic disease, stable.   Hyperglycemia with mildly elevated fasting blood sugar 107.  Continue surveillance for diabetes as outpatient.   Hyponatremia and hypokalemia due to chemotherapy and malignancy.  Hydrated and given oral supplementation of potassium.    Procedures:  ECHO 10/22  CT chest with contrast 10/22  CT abd/pelvis with contrast 10/22  Consultations:  none  Discharge Exam: Filed Vitals:   12/24/12 0520  BP: 136/84  Pulse: 81  Temp: 98.3 F (36.8 C)  Resp: 16   Filed Vitals:   12/23/12 1300 12/23/12 1336 12/23/12 2129 12/24/12 0520  BP: 120/79 135/84 118/76 136/84  Pulse: 73 74 73 81  Temp:  98.3 F (36.8 C) 98.7 F (37.1 C) 98.3 F (36.8 C)  TempSrc:  Oral Oral Oral  Resp: 17 16 20 16   Height:  6\' 1"  (1.854 m)    Weight:  85.276 kg (188 lb)    SpO2: 99% 100% 99% 100%   General: CM, no acute distress HEENT:  NCAT, MMM Cardiovascular: RRR, no mrg, normal S1, S2, 2+ pulses Respiratory: CTAB, no increased WOB ABD:  NABS, soft, NT/ND MSK:  No LEE NEURO:  Grossly intact  Discharge Instructions       Discharge Orders   Future Appointments Provider Department Dept Phone   12/28/2012 8:30 AM Rachael Fee [facility] [phone]   12/28/2012 9:00 AM Josph Macho, MD [facility] [phone]   12/28/2012 10:00 AM [facility] Chair [address] [place] [facility] [phone]   12/30/2012 2:30 PM [facility] Inj Nurse [place] [facility] [phone]   Future Orders Complete By Expires   Call MD for:  difficulty breathing, headache or visual disturbances  As directed    Call MD for:  extreme fatigue  As directed    Call MD for:  hives  As directed    Call MD for:  persistant dizziness or light-headedness  As directed    Call MD for:  persistant nausea and vomiting  As directed    Call MD for:  severe uncontrolled pain  As directed    Call MD for:  temperature >100.4  As directed    Diet general  As directed    Discharge instructions  As directed    Comments:     You were hospitalized with a large pulmonary embolism.  Your blood pressure and heart rhythm remained normal and the ultrasound of your heart demonstrated normal structure and function of the right side of the heart.  This is important because the right side of your heart can be strained by a pulmonary embolism.  You were started on lovenox, an injectable blood thinner.  It is very important that you take this medication every day at the same time and that you do not miss any doses.  Please take your next dose tonight at 9PM.  If you develop heavy bleeding that is not stopping, please seek immediate medical attention.  Please follow up with Dr. [name] at your already scheduled appointment.   Increase activity slowly  As directed        Medication List         bifidobacterium infantis capsule  Take 3 capsules by mouth daily.     CIALIS 20 MG tablet  Generic drug:  tadalafil  Take 20 mg by mouth daily as needed for erectile dysfunction.      dexamethasone 4 MG tablet  Commonly known as:  DECADRON  Take 4 mg by mouth 2 (two) times daily with a meal. Take (2) tabs 8 mg twice daily with a meal starting the day after chemo x 3 days.     diphenoxylate-atropine 2.5-0.025 MG per tablet  Commonly known as:  LOMOTIL  Take 1 tablet by mouth 4 (four) times daily as needed for diarrhea or loose stools.     enoxaparin 120 MG/0.8ML injection  Commonly known as:  LOVENOX  Inject 0.8 mLs (120 mg total) into the skin at bedtime.     fish oil-omega-3 fatty acids 1000 MG capsule  Take 2 g by mouth daily.     HYALURONIC ACID PO  Take 3 tablets by mouth daily.     lidocaine-prilocaine cream  Commonly  known as:  EMLA  Apply 1 application topically as needed. Apply quarter sized amount of cream to portacath site at least one hour prior to chemotherapy appt.  Cover with saran wrap     LORazepam 1 MG tablet  Commonly known as:  ATIVAN  Take 1 mg by mouth every 6 (six) hours as needed (sleep).     ondansetron 8 MG tablet  Commonly known as:  ZOFRAN  Take 8 mg by mouth every 8 (eight) hours as needed for nausea. Take 1 tab twice daily starting the day after chemo x 3 days then twice daily as needed for nausea.     OVER THE COUNTER MEDICATION  Take 2 tablets by mouth 3 (three) times daily. Dr Blacks super food     Pancrelipase (Lip-Prot-Amyl) 24000 UNITS Cpep  Take 1 capsule (24,000 Units total) by mouth 4 (four) times daily. Take 1 prior to each meal     VITAMIN B-6 CR PO  Take 1 tablet by mouth 2 (two) times daily.       Follow-up Information   Schedule an appointment as soon as possible for a visit with Austin Lemons, DO. (As needed)    Specialty:  Internal Medicine   Contact information:   [address] [place] [address] [phone]       Follow up with Josph Macho, MD. (already scheduled appointment)    Specialty:  Oncology   Contact information:   [address] [place]  [address] [phone]        The results of significant diagnostics from this hospitalization (including imaging, microbiology, ancillary and laboratory) are listed below for reference.    Significant Diagnostic Studies: Ct Chest W Contrast  12/23/2012   CLINICAL DATA:  Metastatic pancreatic cancer. Patient on chemotherapy.  EXAM: CT CHEST, ABDOMEN, AND PELVIS WITH CONTRAST  TECHNIQUE: Multidetector CT imaging of the chest, abdomen and pelvis was performed following the standard protocol during bolus administration of intravenous contrast.  CONTRAST:  OMNIPAQUE IOHEXOL 300 MG/ML  SOLN  COMPARISON:  CT chest, abdomen and pelvis 11/03/2012 and 09/14/2012.  FINDINGS: CT CHEST FINDINGS  Since the prior study, the patient has developed bilateral pulmonary emboli with a saddle embolus seen across the main pulmonary arteries and clot in the descending interlobar arteries best visualized on the left. There is no axillary, hilar or mediastinal lymphadenopathy. Heart size is normal. No pleural or pericardial effusion. The lungs are clear. Bones demonstrate fixation of a left clavicle fracture. No lytic or sclerotic lesion is identified. Thoracic spondylosis is noted.  CT ABDOMEN AND PELVIS FINDINGS  Biliary stent remains in place with associated pneumobilia identified. Low attenuating pancreatic head mass measures 2.9 x 2.6 cm on image 72 of series 2 compared to 3.0 x 2.5 cm on the most recent study (remeasurement). Atrophic pancreatic tail is again seen low-density lesion along the body of the pancreas which had measured 2.5 x 2.1 cm today measures 2.4 by 2.2 cm on images seen 9 of series 2. No lymphadenopathy is identified.  Previously seen low attenuating lesion in the posterior right hepatic lobe CT which had measured 1 cm in diameter today measures 2.1 x 1.6 cm on image 65. Subcapsular lesion in the right hepatic lobe which had measured 1 cm in diameter today measures 1.4 cm on image 61. Lesion in the  superior left hepatic lobe which had measured 0.9 cm today measures 1.6 cm. 1.6 and 0.9 cm left hepatic lobe lesions seen  on the most recent study today measure 1.8 and 1.7 cm on image 57. Other visualized liver lesions show similar increase in size.  The gallbladder is mildly distended but otherwise unremarkable. Small left adrenal adenoma is again seen. The spleen is unremarkable. Small renal cysts are again seen, unchanged. The prostate gland is calcifications is mildly enlarged. The urinary bladder is unremarkable. The stomach, small and large bowel and appendix appear normal. No lytic or sclerotic bony lesion is seen.  IMPRESSION: The study is positive for pulmonary embolus as described above.  Negative for metastatic disease in the chest.  Hepatic lesions seen on the prior exam have increased in size consistent with progressive metastatic disease.  No marked change in the patient's pancreatic head mass.  Critical Value/emergent results were called by telephone at the time of interpretation on 12/23/2012 at 9:34 AM to Stevenson Clinch, R.N. who verbally acknowledged these results.   Electronically Signed   By: Drusilla Kanner M.D.   On: 12/23/2012 09:38   Ct Abdomen Pelvis W Contrast  12/23/2012   CLINICAL DATA:  Metastatic pancreatic cancer. Patient on chemotherapy.  EXAM: CT CHEST, ABDOMEN, AND PELVIS WITH CONTRAST  TECHNIQUE: Multidetector CT imaging of the chest, abdomen and pelvis was performed following the standard protocol during bolus administration of intravenous contrast.  CONTRAST:  OMNIPAQUE IOHEXOL 300 MG/ML  SOLN  COMPARISON:  CT chest, abdomen and pelvis 11/03/2012 and 09/14/2012.  FINDINGS: CT CHEST FINDINGS  Since the prior study, the patient has developed bilateral pulmonary emboli with a saddle embolus seen across the main pulmonary arteries and clot in the descending interlobar arteries best visualized on the left. There is no axillary, hilar or mediastinal lymphadenopathy. Heart  size is normal. No pleural or pericardial effusion. The lungs are clear. Bones demonstrate fixation of a left clavicle fracture. No lytic or sclerotic lesion is identified. Thoracic spondylosis is noted.  CT ABDOMEN AND PELVIS FINDINGS  Biliary stent remains in place with associated pneumobilia identified. Low attenuating pancreatic head mass measures 2.9 x 2.6 cm on image 72 of series 2 compared to 3.0 x 2.5 cm on the most recent study (remeasurement). Atrophic pancreatic tail is again seen low-density lesion along the body of the pancreas which had measured 2.5 x 2.1 cm today measures 2.4 by 2.2 cm on images seen 9 of series 2. No lymphadenopathy is identified.  Previously seen low attenuating lesion in the posterior right hepatic lobe CT which had measured 1 cm in diameter today measures 2.1 x 1.6 cm on image 65. Subcapsular lesion in the right hepatic lobe which had measured 1 cm in diameter today measures 1.4 cm on image 61. Lesion in the superior left hepatic lobe which had measured 0.9 cm today measures 1.6 cm. 1.6 and 0.9 cm left hepatic lobe lesions seen on the most recent study today measure 1.8 and 1.7 cm on image 57. Other visualized liver lesions show similar increase in size.  The gallbladder is mildly distended but otherwise unremarkable. Small left adrenal adenoma is again seen. The spleen is unremarkable. Small renal cysts are again seen, unchanged. The prostate gland is calcifications is mildly enlarged. The urinary bladder is unremarkable. The stomach, small and large bowel and appendix appear normal. No lytic or sclerotic bony lesion is seen.  IMPRESSION: The study is positive for pulmonary embolus as described above.  Negative for metastatic disease in the chest.  Hepatic lesions seen on the prior exam have increased in size consistent with progressive metastatic disease.  No marked change in the patient's pancreatic head mass.  Critical Value/emergent results were called by telephone at the  time of interpretation on 12/23/2012 at 9:34 AM to Stevenson Clinch, R.N. who verbally acknowledged these results.   Electronically Signed   By: Drusilla Kanner M.D.   On: 12/23/2012 09:38    Microbiology: No results found for this or any previous visit (from the past 240 hour(s)).   Labs: Basic Metabolic Panel:  Recent Labs Lab 12/23/12 1110 12/24/12 0525  NA 133* 138  K 3.4* 4.1  CL 103 109  CO2 21 21  GLUCOSE 146* 107*  BUN 9 8  CREATININE 0.79 0.92  CALCIUM 8.9 9.5   Liver Function Tests:  Recent Labs Lab 12/23/12 1701  AST 28  ALT 25  ALKPHOS 104  BILITOT 0.3  PROT 6.5  ALBUMIN 3.2*   No results found for this basename: LIPASE, AMYLASE,  in the last 168 hours No results found for this basename: AMMONIA,  in the last 168 hours CBC:  Recent Labs Lab 12/23/12 1110 12/24/12 0525  WBC 4.2 3.5*  HGB 11.8* 11.9*  HCT 34.4* 36.0*  MCV 93.0 93.8  PLT 116* 142*   Cardiac Enzymes: No results found for this basename: CKTOTAL, CKMB, CKMBINDEX, TROPONINI,  in the last 168 hours BNP: BNP (last 3 results) No results found for this basename: PROBNP,  in the last 8760 hours CBG: No results found for this basename: GLUCAP,  in the last 168 hours  Time coordinating discharge: 45 minutes  Signed:  Sabine Tenenbaum  [facility] 12/24/2012, 3:18 PM

## 2012-12-24 NOTE — Progress Notes (Signed)
D/C instructions reviewed w/ pt and friend.  All questions answered, no further questions. Pt d/c in w/c by NT in stable condition. Pt in possession of d/c instructions and all personal belongings.  Script e prescribed to pt's pharmacy by Dr Malachi Bonds and pt aware of need to obtain med and self admin Lovenox injection at 9pm. Verbalizes understanding of all instructions.

## 2012-12-24 NOTE — Progress Notes (Signed)
Pt demonstrated competent self injection of 0.41ml of NSS per Dr Joan Mayans request. Pt verbalizes comfort w/ self injections of Lovenox at home and states he is comfortable w/ d/c home this afternoon.

## 2012-12-24 NOTE — Progress Notes (Deleted)
Pt off unit in bed to radiation.

## 2012-12-24 NOTE — Progress Notes (Signed)
PHARMACY BRIEF NOTE -- Drug Level Result  Consult for:  IV Heparin Indication:  Pulmonary embolus  With the infusion of 1400 units/hr, the Heparin level drawn at 2315 was reported as 0.32 units/ml.  This level is within the therapeutic range, 0.3-0.7 units/ml.  No problems with infusion or bleeding reported to Rx  Plan:  Continue the current infusion rate.  Continue daily CBC and Heparin level.  Lorenza Evangelist 12/24/2012 12:49 AM

## 2012-12-24 NOTE — Care Management Note (Addendum)
    Page 1 of 1   12/24/2012     4:04:51 PM   CARE MANAGEMENT NOTE 12/24/2012  Patient:  Austin Escobar, Austin Escobar   Account Number:  [id number]  Date Initiated:  12/24/2012  Documentation initiated by:  Lanier Clam  Subjective/Objective Assessment:   57 Y/O M ADMITTED W/SOB.PE.     Action/Plan:   FROM HOME.HAS PCP,PHARMACY.   Anticipated DC Date:  12/24/2012   Anticipated DC Plan:  HOME/SELF CARE      DC Planning Services  CM consult      Choice offered to / List presented to:             Status of service:  In process, will continue to follow Medicare Important Message given?   (If response is "NO", the following Medicare IM given date fields will be blank) Date Medicare IM given:   Date Additional Medicare IM given:    Discharge Disposition:  HOME/SELF CARE  Per UR Regulation:  Reviewed for med. necessity/level of care/duration of stay  If discussed at Long Length of Stay Meetings, dates discussed:    Comments:  12/24/12 Chesnee Floren RN,BSN [phone] PATIENT HAD CONCERNS ABOUT [facility] W/QUESTIONS ABOUT IF MED IS FOR MEDICAL USE,& RX BIN#,ETC.EXPLAINED TO PATIENT THAT SINCE HE HAS PRESCRIPTION COVERAGE,& NO CPAY FOR LOVENOX/GENERIC, THE PHARMACY SHOULD BE ABLE TO PUT IN HIS INSURANCE INFO,& SCRIPT TO PROVIDE MEDICATION IF THEY HAVE THE MEDICINE.PATIENT WILL USE [facility] [address],DR. SHORT INFORMED & SENT SCRIPT TO THAT PHARMACY.PATIENT VOICED UNDERSTANDING. VERIFIED BENEFITS FOR LOVENOX.HAS SCRIPT COVERAGE, NO CO-PAY FOR LOVENOX.EXPLAINED TO PATIENT ABOUT HIS COVERAGE.PHARMACY-[facility], [facility] [place].HE OK THANKS.MD UPDATED.

## 2012-12-24 NOTE — Progress Notes (Signed)
Porta cath deaccessed. Flushed with 10cc NS followed by Heparin 5ml (100u/ml). No bleeding to site, band aid to site for comfort. Jewelle Whitner M 

## 2012-12-24 NOTE — Progress Notes (Signed)
ANTICOAGULATION CONSULT NOTE - Follow-up Consult  Pharmacy Consult for IV Heparin to transition to LMWH Indication: pulmonary embolus  No Known Allergies  Patient Measurements: Height: 6\' 1"  (185.4 cm) Weight: 188 lb (85.276 kg) IBW/kg (Calculated) : 79.9 Weight 85.3 kg (12/09/12) Height 180 cm IBW 75 kg Heparin Dosing Weight: 85 kg  Vital Signs: Temp: 98.3 F (36.8 C) (10/23 0520) Temp src: Oral (10/23 0520) BP: 136/84 mmHg (10/23 0520) Pulse Rate: 81 (10/23 0520)  Labs:  Recent Labs  12/23/12 1110 12/23/12 1701 12/23/12 2315 12/24/12 0525  HGB 11.8*  --   --  11.9*  HCT 34.4*  --   --  36.0*  PLT 116*  --   --  142*  APTT 28  --   --   --   LABPROT 15.5*  --   --   --   INR 1.26  --   --   --   HEPARINUNFRC  --  0.39 0.32 0.32  CREATININE 0.79  --   --  0.92    Estimated Creatinine Clearance: 97.7 ml/min (by C-G formula based on Cr of 0.92).   Medical History: Past Medical History  Diagnosis Date  . Cellulitis and abscess of trunk   . CELLULITIS, RIGHT LEG   . LOW BACK PAIN   . HTN (hypertension)   . Personal history of colonic adenomas 04/29/2012  . Afib   . ED (erectile dysfunction)   . Asthma     as a child- no problems after childhood  . 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 Pancreatic mass 06/25/2012    EUS  . Diverticulosis   . Pancreatic cancer   . Testicular cancer      Assessment: 57 yoM with history of pancreatitic cancer presented 10/22  from [facility] with CT positive for PE.  Heparin IV started 10/22 and today pt to transition to LMWH.  Note patient completed cycle 3 of 4 of FOLFIRINOX chemotherapy 10/8.   Baseline labs: Hgb 11.8, Platelets 116K (note s/p chemo).   D#2 IV heparin: Heparin level this AM therapeutic = 0.32 (goal 0.3-0.7).  Will d/c IV heparin and schedule LMWH 1mg /kg q 12 hours 1 hour after infusion is stopped.  Consider changing to LMWH 1.5mg /kg q 24 hours for long-term management.     CBC: Stable, no bleeding  noted per RN  No issues noted with renal function, CrCl ~ 98 ml/min  Current weight = 85.3kg  Goal of Therapy:  Heparin level 0.3-0.7 units/ml Monitor platelets by anticoagulation protocol: Yes   Plan:   Stop IV heparin at 0800  Lovenox 85mg  SQ q 12 hours starting at 0900.    F/u CBC, renal function, s/sxs bleeding, clinical course  Haynes Hoehn, PharmD 12/24/2012, 7:04 AM

## 2012-12-25 ENCOUNTER — Other Ambulatory Visit: Payer: Self-pay | Admitting: Nurse Practitioner

## 2012-12-25 DIAGNOSIS — C259 Malignant neoplasm of pancreas, unspecified: Secondary | ICD-10-CM

## 2012-12-28 ENCOUNTER — Ambulatory Visit (HOSPITAL_BASED_OUTPATIENT_CLINIC_OR_DEPARTMENT_OTHER): Payer: BC Managed Care – PPO | Admitting: Hematology & Oncology

## 2012-12-28 ENCOUNTER — Ambulatory Visit: Payer: BC Managed Care – PPO

## 2012-12-28 ENCOUNTER — Other Ambulatory Visit (HOSPITAL_BASED_OUTPATIENT_CLINIC_OR_DEPARTMENT_OTHER): Payer: BC Managed Care – PPO | Admitting: Lab

## 2012-12-28 VITALS — BP 118/74 | HR 78 | Temp 98.2°F | Resp 18 | Ht 72.0 in | Wt 186.0 lb

## 2012-12-28 DIAGNOSIS — I2699 Other pulmonary embolism without acute cor pulmonale: Secondary | ICD-10-CM

## 2012-12-28 DIAGNOSIS — C259 Malignant neoplasm of pancreas, unspecified: Secondary | ICD-10-CM

## 2012-12-28 DIAGNOSIS — C250 Malignant neoplasm of head of pancreas: Secondary | ICD-10-CM

## 2012-12-28 DIAGNOSIS — C787 Secondary malignant neoplasm of liver and intrahepatic bile duct: Secondary | ICD-10-CM

## 2012-12-28 LAB — CBC WITH DIFFERENTIAL (CANCER CENTER ONLY)
BASO%: 0.7 % (ref 0.0–2.0)
EOS%: 1.4 % (ref 0.0–7.0)
Eosinophils Absolute: 0 10*3/uL (ref 0.0–0.5)
LYMPH%: 33.7 % (ref 14.0–48.0)
MCH: 31.4 pg (ref 28.0–33.4)
MCHC: 32.6 g/dL (ref 32.0–35.9)
MCV: 96 fL (ref 82–98)
MONO#: 0.9 10*3/uL (ref 0.1–0.9)
MONO%: 30.2 % — ABNORMAL HIGH (ref 0.0–13.0)
NEUT#: 1 10*3/uL — ABNORMAL LOW (ref 1.5–6.5)
Platelets: 130 10*3/uL — ABNORMAL LOW (ref 145–400)
RBC: 3.89 10*6/uL — ABNORMAL LOW (ref 4.20–5.70)
RDW: 14.8 % (ref 11.1–15.7)

## 2012-12-28 MED ORDER — ENOXAPARIN SODIUM 120 MG/0.8ML ~~LOC~~ SOLN: 120.0000 mg | Freq: Every day | SUBCUTANEOUS | Status: DC

## 2012-12-28 NOTE — Progress Notes (Signed)
This office note has been dictated.

## 2012-12-29 NOTE — Progress Notes (Signed)
DIAGNOSES: 1. Metastatic pancreatic cancer. 2. Pulmonary embolism.  CURRENT THERAPY: 1. Patient is status post 3 cycles of FOLFIRINOX. 2. Lovenox 120 mg subcu q. day.  INTERIM HISTORY:  Austin Escobar comes in for followup.  Unfortunately, he has not responded to chemotherapy.  Despite aggressive first and second- line chemotherapy, his cancer continues to progress.  He had a CT scan done on the 22nd.  Shockingly, he was found to have a saddle pulmonary embolism.  This put him in the hospital.  He now is on Lovenox.  He, unfortunately, was found to have progression of liver metastases.  His pancreatic head mass measures 2.9 x 2.6, which is stable.  He continues to feel well.  He has had no problems with chemotherapy. His appetite is not doing too bad.  His diarrhea is much better controlled with Creon.  He has had a little bit of tingling in the hands and feet.  This is not any worse.  He has had no bleeding.  He has had no leg swelling.  He has had no cough or chest wall pain.  Overall, his performance status is ECOG 1.  His CA19-9 when we last checked was up to 88,000.  PHYSICAL EXAMINATION:  General:  This is a well-developed, well- nourished white gentleman in no obvious distress.  Vital signs: Temperature of 98.2, pulse 78, respiratory rate 18, blood pressure 118/74.  Weight is 186 pounds.  Head and neck:  Normocephalic, atraumatic skull.  There are no ocular or oral lesions.  He has no scleral icterus.  He has no adenopathy in his neck.  Lungs:  Clear bilaterally.  There are no rales, wheezes, or rhonchi.  There is no friction rub.  Cardiac:  Regular rate and rhythm with a normal S1 and S2.  There are no murmurs, rubs, or bruits.  Abdomen:  Soft.  He has good bowel sounds.  There is no fluid wave.  There is no palpable abdominal mass.  There is no palpable hepatosplenomegaly.  Back:  No tenderness over the spine, ribs, or hips.  Extremities:  No clubbing, cyanosis, or  edema.  No palpable venous cord is noted in his legs. Skin:  No rashes, ecchymosis, or petechia.  LABORATORY STUDIES:  White cell count is 2.9, hemoglobin 12.2, hematocrit 37.4, platelet count is 130.  IMPRESSION:  Austin Escobar is a 57 year old gentleman with metastatic pancreatic cancer.  He has progressed despite 2 lines of chemotherapy.  We are going to have to refer him to one of the academic centers.  I will see about getting him sent to [facility].  I think they have a pretty good GI oncology program down there.  He still has a great performance status, so he would be able to tolerate protocol therapy.  I am going to send off a C-reactive protein on him.  There have been some recent studies that have shown that [facility] along with Xel[employer] have had responses in patients with pancreatic cancer with elevated C- reactive protein.  The saddle embolism in his lungs clearly is reflective of his malignancy.  He will be on lifelong Lovenox from my point of view.  As far as getting him back to see [place], I will probably get him back in another month or so.  Again, we will get him referred to [facility]  Austin Escobar was in with his girlfriend.  He understands the situation well.  He certainly has done all that we have asked him to do.  Having  a saddle embolism does complicate things, but he is asymptomatic with this and hopefully will see good resolution with the Lovenox.    ______________________________ Josph Macho, M.D. PRE/MEDQ  D:  12/28/2012  T:  12/29/2012  Job:  [id number]

## 2012-12-31 ENCOUNTER — Encounter: Payer: Self-pay | Admitting: *Deleted

## 2013-01-04 ENCOUNTER — Encounter: Payer: Self-pay | Admitting: *Deleted

## 2013-01-04 NOTE — Progress Notes (Signed)
Received call from Selena Batten at [facility].  States the first they can see Mr. Raudenbush is November 13.  Dr. [name] made aware.  Selena Batten will call pt with visit information.

## 2013-01-07 ENCOUNTER — Other Ambulatory Visit: Payer: Self-pay

## 2013-01-19 ENCOUNTER — Other Ambulatory Visit: Payer: Self-pay | Admitting: *Deleted

## 2013-01-19 MED ORDER — HYOSCYAMINE SULFATE 0.125 MG PO TABS: 0.1250 mg | ORAL_TABLET | Freq: Four times a day (QID) | ORAL | Status: DC | PRN

## 2013-01-25 ENCOUNTER — Other Ambulatory Visit: Payer: BC Managed Care – PPO | Admitting: Lab

## 2013-01-25 ENCOUNTER — Ambulatory Visit: Payer: BC Managed Care – PPO | Admitting: Hematology & Oncology

## 2013-02-04 ENCOUNTER — Other Ambulatory Visit: Payer: Self-pay | Admitting: *Deleted

## 2013-02-04 DIAGNOSIS — C259 Malignant neoplasm of pancreas, unspecified: Secondary | ICD-10-CM

## 2013-02-04 DIAGNOSIS — R1032 Left lower quadrant pain: Secondary | ICD-10-CM

## 2013-02-04 MED ORDER — OXYCODONE HCL 20 MG/ML PO CONC: 6.0000 mg | ORAL | Status: DC | PRN

## 2013-02-04 NOTE — Telephone Encounter (Signed)
Pt called stating his low abd pain was no longer being relieved by Levsin and needed something stronger. The cramping is lasting through the night. He is having a daily BM. Reviewed with Dr [name]. To start Oxy-fast 6-12 mg (rounded per computer). Routed to Dr [name] for approval and signature then will place at the front desk for pick up.

## 2013-02-08 ENCOUNTER — Inpatient Hospital Stay (HOSPITAL_COMMUNITY)
Admission: EM | Admit: 2013-02-08 | Discharge: 2013-02-14 | DRG: 356 | Disposition: A | Discharge status: Home / Self Care | Payer: BC Managed Care – PPO | Attending: Internal Medicine | Admitting: Internal Medicine

## 2013-02-08 ENCOUNTER — Encounter (HOSPITAL_COMMUNITY): Payer: Self-pay | Admitting: Emergency Medicine

## 2013-02-08 DIAGNOSIS — D696 Thrombocytopenia, unspecified: Secondary | ICD-10-CM | POA: Diagnosis present

## 2013-02-08 DIAGNOSIS — Z8249 Family history of ischemic heart disease and other diseases of the circulatory system: Secondary | ICD-10-CM | POA: Diagnosis not present

## 2013-02-08 DIAGNOSIS — Z7901 Long term (current) use of anticoagulants: Secondary | ICD-10-CM | POA: Diagnosis not present

## 2013-02-08 DIAGNOSIS — Z79899 Other long term (current) drug therapy: Secondary | ICD-10-CM | POA: Diagnosis not present

## 2013-02-08 DIAGNOSIS — J45909 Unspecified asthma, uncomplicated: Secondary | ICD-10-CM | POA: Diagnosis present

## 2013-02-08 DIAGNOSIS — K921 Melena: Secondary | ICD-10-CM

## 2013-02-08 DIAGNOSIS — K319 Disease of stomach and duodenum, unspecified: Secondary | ICD-10-CM | POA: Diagnosis present

## 2013-02-08 DIAGNOSIS — E871 Hypo-osmolality and hyponatremia: Secondary | ICD-10-CM

## 2013-02-08 DIAGNOSIS — L02219 Cutaneous abscess of trunk, unspecified: Secondary | ICD-10-CM

## 2013-02-08 DIAGNOSIS — Z8719 Personal history of other diseases of the digestive system: Secondary | ICD-10-CM | POA: Diagnosis not present

## 2013-02-08 DIAGNOSIS — Z860100 Personal history of colon polyps, unspecified: Secondary | ICD-10-CM

## 2013-02-08 DIAGNOSIS — I81 Portal vein thrombosis: Secondary | ICD-10-CM | POA: Diagnosis present

## 2013-02-08 DIAGNOSIS — I498 Other specified cardiac arrhythmias: Secondary | ICD-10-CM | POA: Diagnosis present

## 2013-02-08 DIAGNOSIS — Z8547 Personal history of malignant neoplasm of testis: Secondary | ICD-10-CM | POA: Diagnosis not present

## 2013-02-08 DIAGNOSIS — Z823 Family history of stroke: Secondary | ICD-10-CM

## 2013-02-08 DIAGNOSIS — I2692 Saddle embolus of pulmonary artery without acute cor pulmonale: Secondary | ICD-10-CM | POA: Diagnosis present

## 2013-02-08 DIAGNOSIS — R180 Malignant ascites: Secondary | ICD-10-CM | POA: Diagnosis present

## 2013-02-08 DIAGNOSIS — D751 Secondary polycythemia: Secondary | ICD-10-CM | POA: Diagnosis present

## 2013-02-08 DIAGNOSIS — C784 Secondary malignant neoplasm of small intestine: Secondary | ICD-10-CM | POA: Diagnosis present

## 2013-02-08 DIAGNOSIS — E876 Hypokalemia: Secondary | ICD-10-CM | POA: Diagnosis not present

## 2013-02-08 DIAGNOSIS — D62 Acute posthemorrhagic anemia: Secondary | ICD-10-CM | POA: Diagnosis present

## 2013-02-08 DIAGNOSIS — K831 Obstruction of bile duct: Secondary | ICD-10-CM | POA: Diagnosis present

## 2013-02-08 DIAGNOSIS — Z9221 Personal history of antineoplastic chemotherapy: Secondary | ICD-10-CM

## 2013-02-08 DIAGNOSIS — I8500 Esophageal varices without bleeding: Secondary | ICD-10-CM

## 2013-02-08 DIAGNOSIS — Z66 Do not resuscitate: Secondary | ICD-10-CM | POA: Diagnosis not present

## 2013-02-08 DIAGNOSIS — I10 Essential (primary) hypertension: Secondary | ICD-10-CM | POA: Diagnosis present

## 2013-02-08 DIAGNOSIS — I4891 Unspecified atrial fibrillation: Secondary | ICD-10-CM | POA: Diagnosis present

## 2013-02-08 DIAGNOSIS — R1032 Left lower quadrant pain: Secondary | ICD-10-CM

## 2013-02-08 DIAGNOSIS — Z833 Family history of diabetes mellitus: Secondary | ICD-10-CM | POA: Diagnosis not present

## 2013-02-08 DIAGNOSIS — C259 Malignant neoplasm of pancreas, unspecified: Secondary | ICD-10-CM | POA: Diagnosis present

## 2013-02-08 DIAGNOSIS — C787 Secondary malignant neoplasm of liver and intrahepatic bile duct: Secondary | ICD-10-CM | POA: Diagnosis present

## 2013-02-08 DIAGNOSIS — M545 Low back pain: Secondary | ICD-10-CM

## 2013-02-08 DIAGNOSIS — K3189 Other diseases of stomach and duodenum: Secondary | ICD-10-CM

## 2013-02-08 DIAGNOSIS — N529 Male erectile dysfunction, unspecified: Secondary | ICD-10-CM

## 2013-02-08 DIAGNOSIS — K922 Gastrointestinal hemorrhage, unspecified: Secondary | ICD-10-CM | POA: Diagnosis present

## 2013-02-08 DIAGNOSIS — D649 Anemia, unspecified: Secondary | ICD-10-CM

## 2013-02-08 DIAGNOSIS — C250 Malignant neoplasm of head of pancreas: Secondary | ICD-10-CM

## 2013-02-08 DIAGNOSIS — R739 Hyperglycemia, unspecified: Secondary | ICD-10-CM

## 2013-02-08 LAB — COMPREHENSIVE METABOLIC PANEL
ALT: 22 U/L (ref 0–53)
AST: 25 U/L (ref 0–37)
Alkaline Phosphatase: 117 U/L (ref 39–117)
CO2: 23 mEq/L (ref 19–32)
Calcium: 9.3 mg/dL (ref 8.4–10.5)
Creatinine, Ser: 0.95 mg/dL (ref 0.50–1.35)
GFR calc Af Amer: >90 mL/min (ref >90)
GFR calc non Af Amer: 89 mL/min — ABNORMAL LOW (ref >90)
Glucose, Bld: 165 mg/dL — ABNORMAL HIGH (ref 70–99)
Potassium: 4 mEq/L (ref 3.5–5.1)
Sodium: 132 mEq/L — ABNORMAL LOW (ref 135–145)

## 2013-02-08 LAB — CBC WITH DIFFERENTIAL/PLATELET
Basophils Absolute: 0 10*3/uL (ref 0.0–0.1)
Eosinophils Relative: 1 % (ref 0–5)
Hemoglobin: 7.9 g/dL — ABNORMAL LOW (ref 13.0–17.0)
Lymphocytes Relative: 7 % — ABNORMAL LOW (ref 12–46)
Lymphs Abs: 0.6 10*3/uL — ABNORMAL LOW (ref 0.7–4.0)
MCV: 92.9 fL (ref 78.0–100.0)
Neutro Abs: 6.7 10*3/uL (ref 1.7–7.7)
Neutrophils Relative %: 83 % — ABNORMAL HIGH (ref 43–77)
Platelets: 120 10*3/uL — ABNORMAL LOW (ref 150–400)
RBC: 2.54 MIL/uL — ABNORMAL LOW (ref 4.22–5.81)
RDW: 15.1 % (ref 11.5–15.5)
WBC: 8.1 10*3/uL (ref 4.0–10.5)

## 2013-02-08 LAB — PRO B NATRIURETIC PEPTIDE: Pro B Natriuretic peptide (BNP): 54.4 pg/mL (ref 0–125)

## 2013-02-08 LAB — PROTIME-INR
INR: 1.39 (ref 0.00–1.49)
Prothrombin Time: 16.7 s — ABNORMAL HIGH (ref 11.6–15.2)

## 2013-02-08 LAB — TROPONIN I: Troponin I: <0.3 ng/mL (ref <0.30)

## 2013-02-08 LAB — ABO/RH: ABO/RH(D): A POS

## 2013-02-08 LAB — MRSA PCR SCREENING: MRSA by PCR: NEGATIVE

## 2013-02-08 LAB — PREPARE RBC (CROSSMATCH)

## 2013-02-08 MED ORDER — SODIUM CHLORIDE 0.9 % IV SOLN
1000.0000 mL | INTRAVENOUS | Status: DC
  Administered 2013-02-08 – 2013-02-10 (×3): 1000 mL via INTRAVENOUS

## 2013-02-08 MED ORDER — OXYCODONE HCL 5 MG PO TABS: 5.0000 mg | ORAL_TABLET | ORAL | Status: DC | PRN

## 2013-02-08 MED ORDER — PANCRELIPASE (LIP-PROT-AMYL) 12000-38000 UNITS PO CPEP
2.0000 | ORAL_CAPSULE | Freq: Three times a day (TID) | ORAL | Status: DC
  Administered 2013-02-09 – 2013-02-14 (×11): 2 via ORAL
  Filled 2013-02-08 (×27): qty 2

## 2013-02-08 MED ORDER — PANTOPRAZOLE SODIUM 40 MG IV SOLR
40.0000 mg | Freq: Two times a day (BID) | INTRAVENOUS | Status: DC
  Administered 2013-02-08 – 2013-02-14 (×13): 40 mg via INTRAVENOUS
  Filled 2013-02-08 (×15): qty 40

## 2013-02-08 MED ORDER — SODIUM CHLORIDE 0.9 % IV SOLN
1000.0000 mL | Freq: Once | INTRAVENOUS | Status: AC
  Administered 2013-02-08: 1000 mL via INTRAVENOUS

## 2013-02-08 MED ORDER — ALIGN PO CAPS
3.0000 | ORAL_CAPSULE | Freq: Every day | ORAL | Status: DC
  Administered 2013-02-10: 1 via ORAL
  Filled 2013-02-08 (×3): qty 3

## 2013-02-08 MED ORDER — ACETAMINOPHEN 650 MG RE SUPP: 650.0000 mg | Freq: Four times a day (QID) | RECTAL | Status: DC | PRN

## 2013-02-08 MED ORDER — HYOSCYAMINE SULFATE 0.125 MG PO TABS
0.1250 mg | ORAL_TABLET | Freq: Four times a day (QID) | ORAL | Status: DC | PRN
  Filled 2013-02-08: qty 1

## 2013-02-08 MED ORDER — ACETAMINOPHEN 325 MG PO TABS: 650.0000 mg | ORAL_TABLET | Freq: Four times a day (QID) | ORAL | Status: DC | PRN

## 2013-02-08 MED ORDER — SODIUM CHLORIDE 0.9 % IV SOLN
INTRAVENOUS | Status: DC
  Administered 2013-02-09: 21:00:00 via INTRAVENOUS

## 2013-02-08 MED ORDER — PANCRELIPASE (LIP-PROT-AMYL) 24000-76000 UNITS PO CPEP: 1.0000 | ORAL_CAPSULE | Freq: Four times a day (QID) | ORAL | Status: DC

## 2013-02-08 MED ORDER — ONDANSETRON HCL 4 MG/2ML IJ SOLN: 4.0000 mg | Freq: Four times a day (QID) | INTRAMUSCULAR | Status: DC | PRN

## 2013-02-08 MED ORDER — ONDANSETRON HCL 4 MG PO TABS: 4.0000 mg | ORAL_TABLET | Freq: Four times a day (QID) | ORAL | Status: DC | PRN
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
## 2013-02-08 NOTE — Progress Notes (Signed)
CRITICAL VALUE ALERT  Critical value received: Hgb 6.9  Date of notification:  02/08/13  Time of notification: 1254  Critical value read back yes  Nurse who received alert: Ermalene Postin RN  MD notified (1st page): Woods  Time of first page: 1254  MD notified (2nd page):  Time of second page:  Responding MD:  Joseph Art  Time MD responded: 1254

## 2013-02-08 NOTE — Progress Notes (Signed)
PREOPERATIVE DIAGNOSIS:  Metastatic pancreatic cancer.  CURRENT THERAPY:  Patient is status post cycle 1 of FOLFIRINOX.  INTERIM HISTORY:  Austin Escobar comes in for his followup.  He tolerated his FOLFIRINOX incredibly well.  He looks incredibly fit.  One would never think that he had a problem just by looking at him.  He had very few side effects.  He had no nausea or vomiting.  His diarrhea seems to be getting better.  He is on Creon.  He has not noted any leg swelling.  He has had no rashes.  There has been no cough or shortness breath.  There has been no mouth sores.  He has had no visual issues.  He has had no headache.  He has had no swallowing difficulties.  Overall, his performance status is ECOG 1.  PHYSICAL EXAMINATION:  General:  This is a well-developed, well- nourished white gentleman in no obvious distress.  Vital Signs: Temperature of 98.2, pulse 78, respiratory rate 18, and blood pressure 115/74.  Weight is 190 pounds.  Head and Neck:  Normocephalic, atraumatic skull.  There are no ocular or oral lesions.  There are no palpable cervical or supraclavicular lymph nodes.  Lungs:  Clear bilaterally.  Cardiac:  Regular rate and rhythm with a normal S1 and S2. There are no murmurs, rubs, or bruits.  Abdomen:  Soft.  He has good bowel sounds.  There is no palpable abdominal mass.  There is no palpable hepatosplenomegaly.  Extremities:  No clubbing, cyanosis, or edema.  He has good strength in his legs.  He has good range motion of his joints.  No venous cord is noted in his legs.  Skin:  No rashes, ecchymosis, or petechia.  Neurologic:  No focal neurological deficits.  LABORATORY STUDIES:  White cell count is 5, hemoglobin 12.6, hematocrit 38.7, and platelet count 100,000.  IMPRESSION:  Austin Escobar is a very nice 57 year old gentleman with metastatic pancreatic cancer.  We tried him initially on Gemzar/Abraxane.  He progressed on this.  We now have him on FOLFIRINOX. I am  just glad that he is tolerating the treatment well.  He has really done well.  Side effects have been minimal, if any.  We will go ahead and treat him today.  I will plan for a followup CT scan after his third cycle of treatment.  We will check his CA19-9 with his next cycle of therapy.  I will see him back in 2 weeks.   ______________________________ Austin Escobar, M.D. PRE/MEDQ  D:  11/25/2012  T:  02/07/2013  Job:  [id number]

## 2013-02-08 NOTE — ED Notes (Signed)
Patient reports that he began having rectal bleeding yesterday.  patient has pancreatic cancer.
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
## 2013-02-09 ENCOUNTER — Encounter (HOSPITAL_COMMUNITY)
Admission: EM | Disposition: A | Discharge status: Home / Self Care | Payer: BC Managed Care – PPO | Source: Home / Self Care | Attending: Internal Medicine

## 2013-02-09 ENCOUNTER — Encounter (HOSPITAL_COMMUNITY): Payer: Self-pay | Admitting: *Deleted

## 2013-02-09 DIAGNOSIS — K319 Disease of stomach and duodenum, unspecified: Secondary | ICD-10-CM

## 2013-02-09 DIAGNOSIS — R1032 Left lower quadrant pain: Secondary | ICD-10-CM

## 2013-02-09 DIAGNOSIS — K3189 Other diseases of stomach and duodenum: Secondary | ICD-10-CM

## 2013-02-09 DIAGNOSIS — I8500 Esophageal varices without bleeding: Secondary | ICD-10-CM

## 2013-02-09 HISTORY — PX: ESOPHAGOGASTRODUODENOSCOPY: SHX5428

## 2013-02-09 LAB — CBC WITH DIFFERENTIAL/PLATELET
Basophils Absolute: 0 10*3/uL (ref 0.0–0.1)
Basophils Relative: 0 % (ref 0–1)
Eosinophils Absolute: 0.1 10*3/uL (ref 0.0–0.7)
Eosinophils Relative: 1 % (ref 0–5)
HCT: 22.5 % — ABNORMAL LOW (ref 39.0–52.0)
Lymphocytes Relative: 12 % (ref 12–46)
MCH: 30 pg (ref 26.0–34.0)
MCHC: 34.2 g/dL (ref 30.0–36.0)
MCV: 87.5 fL (ref 78.0–100.0)
Monocytes Absolute: 1.5 10*3/uL — ABNORMAL HIGH (ref 0.1–1.0)
Neutrophils Relative %: 71 % (ref 43–77)
Platelets: 70 10*3/uL — ABNORMAL LOW (ref 150–400)
RDW: 17.2 % — ABNORMAL HIGH (ref 11.5–15.5)

## 2013-02-09 LAB — COMPREHENSIVE METABOLIC PANEL
AST: 24 U/L (ref 0–37)
Albumin: 2.4 g/dL — ABNORMAL LOW (ref 3.5–5.2)
Alkaline Phosphatase: 88 U/L (ref 39–117)
CO2: 22 mEq/L (ref 19–32)
Chloride: 107 mEq/L (ref 96–112)
GFR calc Af Amer: >90 mL/min (ref >90)
Potassium: 3.9 mEq/L (ref 3.5–5.1)
Total Bilirubin: 0.5 mg/dL (ref 0.3–1.2)

## 2013-02-09 LAB — PREPARE RBC (CROSSMATCH)

## 2013-02-09 SURGERY — EGD (ESOPHAGOGASTRODUODENOSCOPY)
Anesthesia: Moderate Sedation

## 2013-02-09 MED ORDER — FENTANYL CITRATE 0.05 MG/ML IJ SOLN
INTRAMUSCULAR | Status: DC | PRN
  Administered 2013-02-09 (×4): 25 ug via INTRAVENOUS

## 2013-02-09 MED ORDER — MIDAZOLAM HCL 10 MG/2ML IJ SOLN
INTRAMUSCULAR | Status: AC
  Filled 2013-02-09: qty 2

## 2013-02-09 MED ORDER — SODIUM CHLORIDE 0.9 % IJ SOLN
PREFILLED_SYRINGE | INTRAMUSCULAR | Status: DC | PRN
  Administered 2013-02-09: 15:00:00

## 2013-02-09 MED ORDER — FENTANYL CITRATE 0.05 MG/ML IJ SOLN
INTRAMUSCULAR | Status: AC
  Filled 2013-02-09: qty 2

## 2013-02-09 MED ORDER — EPINEPHRINE HCL 0.1 MG/ML IJ SOSY
PREFILLED_SYRINGE | INTRAMUSCULAR | Status: AC
  Filled 2013-02-09: qty 10

## 2013-02-09 MED ORDER — BUTAMBEN-TETRACAINE-BENZOCAINE 2-2-14 % EX AERO
INHALATION_SPRAY | CUTANEOUS | Status: DC | PRN
  Administered 2013-02-09: 2 via TOPICAL

## 2013-02-09 MED ORDER — SODIUM CHLORIDE 0.9 % IV SOLN: INTRAVENOUS | Status: DC

## 2013-02-09 MED ORDER — MIDAZOLAM HCL 10 MG/2ML IJ SOLN
INTRAMUSCULAR | Status: DC | PRN
  Administered 2013-02-09 (×6): 2 mg via INTRAVENOUS

## 2013-02-09 NOTE — Op Note (Signed)
[facility] [address] [place], [address]   ENDOSCOPY PROCEDURE REPORT  PATIENT: Austin Escobar, Austin Escobar  MR#: [id number] BIRTHDATE: 1955-08-27 , 57  yrs. old GENDER: Male ENDOSCOPIST: Roxy Cedar, MD REFERRED BY:  [facility] PROCEDURE DATE:  02/09/2013 PROCEDURE:  EGD w/ control of bleeding (epinephrine injection)and EGD w/ biopsy ASA CLASS:     Class IV INDICATIONS:  Melena.   Hematochezia. MEDICATIONS: Fentanyl 100 mcg IV and Versed 10 mg IV TOPICAL ANESTHETIC: Cetacaine Spray  DESCRIPTION OF PROCEDURE: After the risks benefits and alternatives of the procedure were thoroughly explained, informed consent was obtained.  The Pentax Gastroscope [id number] endoscope was introduced through the mouth and advanced to the second portion of the duodenum. Without limitations.  The instrument was slowly withdrawn as the mucosa was fully examined.    EXAM: The esophagus revealed 3 columns of 2+ esophageal varices with red marks.  No active bleeding.  The stomach revealed mild diffuse portal gastropathy.  No proximal gastric varices.  Between the duodenal wall the second portion was obvious mass effect with abnormal and bleeding (oozing)duodenal surface consistent with infiltrating tumor.  The area was injected with epinephrine which resulted in transient hemostasis.  Subsequently, biopsies of the abnormal-appearing tissue taken.  The post bulbar duodenum revealed a previously placed SEMS in good position.  Retroflexed views revealed no abnormalities.     The scope was then withdrawn from the patient and the procedure completed.  COMPLICATIONS: There were no complications. ENDOSCOPIC IMPRESSION: 1. 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 GI bleeding from the bulb second duodenum junction consistent with tumor infiltration.  RECOMMENDATIONS: 1. Await biopsy results 2. Supportive care. No effective medical therapy for  this problem 3. Notify oncology. Poor prognosis  REPEAT EXAM:  eSigned:  Roxy Cedar, MD 02/09/2013 3:37 PM   XB:JYNWG [name], MD, Stan Head, MD, and [name], DO

## 2013-02-09 NOTE — Progress Notes (Signed)
[facility] PROGRESS NOTE  Austin Escobar:[id number] DOB: May 31, 1955 DOA: 02/08/2013 PCP: [name], DO  Assessment/Plan: Lower GI bleed  -Patient's hemoglobin= 7.7; patient has been typed and crossed and will be transfused another 3 unit PRBC  -Have consulted [facility] and a plan to perform colonoscopy today    Atrial fibrillation  -Currently in normal sinus rhythm, will continue to monitor   HTN  -Slightly elevated will monitor and not treated this time.    Secondary polycythemia  -Currently patient's H./H. is low   Pancreatic cancer (metastatic)  - Patient is aware of his poor prognosis, and if still hoping to be accepted in a trial at [facility] by Dr. [name] (oncologist).   Pulmonary saddle embolus  -Secondary to patient's GI bleed will not be able to continue to anticoagulate patient; patient is aware of the risk -will await GIs findings and recommendations    Code Status: Full Family Communication: No family members present for discussion of care Disposition Plan: Per GI   Consultants: ([facility])   Procedures:  Colonoscopy 04/22/2012 by Lamar Laundry Sena Slate ([facility])  1.Four sessile polyps measuring 6-10 mm in size were found in the  ascending colon, at the splenic flexure, in the descending colon,  and sigmoid colon; polypectomy was performed with a cold snare and  using snare cautery  2. Severe diverticulosis was noted in the sigmoid colon - likely  cause of transient bowel habit change  3. Moderate sized internal hemorrhoids  4. The colon mucosa was otherwise normal    Antibiotics:    HPI/Subjective: Austin Escobar is a 57 y.o. WM PMHx Severe diverticulosis+ multiple colon polyps (by colonoscopy 04/22/2012), seminoma right testicle, asthma, metastatic pancreatic cancer Dx April 2014 (completed chemotherapy) (oncologist Dr.Male [name]), bile duct stricture, secondary polycythemia, saddle pulmonary embolus Dx 10/23 2014, atrial  fibrillation, HTN. States 2 months ago after failing several rounds of chemotherapy Dr. [name] released patient to seek treatment at [facility] at a clinical trial by Dr. [name] (oncologist). States starting Friday began to notice dark-colored stool which she attributed to the medial heat and. By Sunday he was seeing BRBPR. States began to feel positive fatigue positive lightheadedness and decided to count to [facility]. At the ED it was discovered patient's hemoglobin= 6.9. Patient states his hemoglobin normally ranges 14-16. Patient states he is full code, and does not have a healthcare power of attorney. Patient admitted for definitive care of GI bleed. 02/09/2013 patient states has been having BRBPR + large clots yesterday afternoon and so late last evening. States his fastings we are putting the blood in he was losing. Currently complains of headache, dull, pain, and being hungry. Patient received 3 units PRBC, but appears he lost through rectal bleeding 2 units PRBC. Patient scheduled for colonoscopy today   Objective: Filed Vitals:   02/09/13 0300 02/09/13 0400 02/09/13 0500 02/09/13 0800  BP: 135/91 140/77 140/82 142/88  Pulse: 78 83 85 93  Temp:  98.2 F (36.8 C)  98 F (36.7 C)  TempSrc:  Oral  Oral  Resp: 15 12 14 18   Height:      Weight:  80.2 kg (176 lb 12.9 oz)    SpO2: 99% 99% 98% 99%    Intake/Output Summary (Last 24 hours) at 02/09/13 0920 Last data filed at 02/09/13 0800  Gross per 24 hour  Intake 2478.75 ml  Output      1 ml  Net 2477.75 ml   [employer]  02/08/13 0902 02/08/13 1200 02/09/13 0400  Weight: 81.647 kg (180 lb) 78.4 kg (172 lb 13.5 oz) 80.2 kg (176 lb 12.9 oz)    Exam: General: A./O. x4, NAD  Cardiovascular: Regular rhythm and rate, negative murmurs rubs gallops, DP/PT pulse one plus bilateral  Respiratory: Clear to auscultation bilateral  Abdomen: Soft, nontender, nondistended, plus bowel sounds  Skin: Negative bruising, negative rashes   Musculoskeletal: Negative pedal edema, negative cyanosis    Data Reviewed: Basic Metabolic Panel:  Recent Labs Lab 02/08/13 0935 02/09/13 0445  NA 132* 137  K 4.0 3.9  CL 100 107  CO2 23 22  GLUCOSE 165* 117*  BUN 26* 28*  CREATININE 0.95 0.98  CALCIUM 9.3 8.3*  MG  --  1.8   Liver Function Tests:  Recent Labs Lab 02/08/13 0935 02/09/13 0445  AST 25 24  ALT 22 20  ALKPHOS 117 88  BILITOT 0.3 0.5  PROT 6.2 4.9*  ALBUMIN 2.9* 2.4*    Recent Labs Lab 02/08/13 0935  LIPASE 28   No results found for this basename: AMMONIA,  in the last 168 hours CBC:  Recent Labs Lab 02/08/13 0935 02/08/13 1230 02/09/13 0445  WBC 8.1  --  9.1  NEUTROABS 6.7  --  6.4  HGB 7.9* 6.9* 7.7*  HCT 23.6*  --  22.5*  MCV 92.9  --  87.5  PLT 120*  --  70*   Cardiac Enzymes:  Recent Labs Lab 02/08/13 1230  TROPONINI <0.30   BNP (last 3 results)  Recent Labs  02/08/13 1230  PROBNP 54.4   CBG: No results found for this basename: GLUCAP,  in the last 168 hours  Recent Results (from the past 240 hour(s))  MRSA PCR SCREENING     Status: None   Collection Time    02/08/13 11:20 AM      Result Value Range Status   MRSA by PCR NEGATIVE  NEGATIVE Final   Comment:            The GeneXpert MRSA Assay (FDA     approved for NASAL specimens     only), is one component of a     comprehensive MRSA colonization     surveillance program. It is not     intended to diagnose MRSA     infection nor to guide or     monitor treatment for     MRSA infections.     Studies: No results found.  Scheduled Meds: . bifidobacterium infantis  3 capsule Oral Daily  . lipase/protease/amylase  2 capsule Oral TID AC & HS  . pantoprazole (PROTONIX) IV  40 mg Intravenous Q12H   Continuous Infusions: . sodium chloride 1,000 mL (02/08/13 1045)  . sodium chloride    . sodium chloride      Principal Problem:   Lower GI bleed Active Problems:   A-fib   HTN (hypertension)    Polycythemia, secondary   Pancreatic cancer   Bile duct stricture   Saddle pulmonary embolus    Time spent: 35 minutes    Jordie Schreur, J  [facility] Pager [phone]. If 7PM-7AM, please contact night-coverage at [email or web address], password [facility] 02/09/2013, 9:20 AM  LOS: 1 day

## 2013-02-09 NOTE — Progress Notes (Signed)
[facility] Progress Note  Subjective:  No BM since 11 pm last night.  Getting two more units of PRBC's today.  No other complaints.  Objective:  Vital signs in last 24 hours: Temp:  [97.6 F (36.4 C)-98.8 F (37.1 C)] 98 F (36.7 C) (12/09 0800) Pulse Rate:  [78-120] 93 (12/09 0800) Resp:  [11-26] 18 (12/09 0800) BP: (110-162)/(71-95) 142/88 mmHg (12/09 0800) SpO2:  [95 %-100 %] 99 % (12/09 0800) Weight:  [172 lb 13.5 oz (78.4 kg)-176 lb 12.9 oz (80.2 kg)] 176 lb 12.9 oz (80.2 kg) (12/09 0400) Last BM Date: 02/08/13 General:  Alert, Well-developed, in NAD Heart:  Regular rate and rhythm; no murmurs Pulm:  CTAB.  No W/R/R. Abdomen:  Soft, non-distended.  BS present.  Non-tender. Extremities:  Without edema. Neurologic:  Alert and  oriented x4;  grossly normal neurologically. Psych:  Alert and cooperative. Normal mood and affect.  Intake/Output from previous day: 12/08 0701 - 12/09 0700 In: 2353.8 [I.V.:1631.3; Blood:722.5] Out: 1 [Stool:1] Intake/Output this shift: Total I/O In: 125 [I.V.:125] Out: -   Lab Results:  Recent Labs  02/08/13 0935 02/08/13 1230 02/09/13 0445  WBC 8.1  --  9.1  HGB 7.9* 6.9* 7.7*  HCT 23.6*  --  22.5*  PLT 120*  --  70*   BMET  Recent Labs  02/08/13 0935 02/09/13 0445  NA 132* 137  K 4.0 3.9  CL 100 107  CO2 23 22  GLUCOSE 165* 117*  BUN 26* 28*  CREATININE 0.95 0.98  CALCIUM 9.3 8.3*   LFT  Recent Labs  02/09/13 0445  PROT 4.9*  ALBUMIN 2.4*  AST 24  ALT 20  ALKPHOS 88  BILITOT 0.5   PT/INR  Recent Labs  02/08/13 0935  LABPROT 16.7*  INR 1.39   Assessment / Plan: -GIB: ? Upper vs lower. Sounds like it could be diverticular, but BUN is up slightly as well. Rule out UGIB source from possible erosion of cancer into the small bowel as a main concern.  -ABLA:  S/p 3 units PRBC's yesterday with only 1 gram bump in Hgb.  Going to receive two more units today.  -Saddle pulmonary embolus diagnosed  12/2012 and was on lovenox at home, however, that has been on hold due to bleeding.  -Metastatic pancreatic cancer s/p 2 lines of chemo with further progression of disease.  -Severe diverticulosis seen on colonoscopy 04/2012.   -Agree with transfusion of PRBC's and close monitoring of Hgb.  -Continue BID PPI IV.  -EGD today, 12/9 around 3:00 pm.     LOS: 1 day   ZEHR, JESSICA D.  02/09/2013, 9:13 AM  Pager number [phone]   Agree. For EGD today  Wilhemina Bonito. Eda Keys., M.D. [facility] Division of Gastroenterology

## 2013-02-09 NOTE — Progress Notes (Signed)
Pt found sitting on BSC after he climbed over the side rails. Pt had large liquid dark reddish  brown stool on commode. Pt states that he called for help but it"did not arrive in time." I reinforced the importance of having assistance when getting OOB and pt agreed. Risa Auman, Georga Hacking, RN

## 2013-02-10 ENCOUNTER — Inpatient Hospital Stay (HOSPITAL_COMMUNITY): Payer: BC Managed Care – PPO

## 2013-02-10 ENCOUNTER — Encounter (HOSPITAL_COMMUNITY): Payer: Self-pay | Admitting: Internal Medicine

## 2013-02-10 ENCOUNTER — Encounter: Payer: Self-pay | Admitting: *Deleted

## 2013-02-10 DIAGNOSIS — D696 Thrombocytopenia, unspecified: Secondary | ICD-10-CM

## 2013-02-10 LAB — CBC WITH DIFFERENTIAL/PLATELET
Basophils Absolute: 0 10*3/uL (ref 0.0–0.1)
Basophils Relative: 0 % (ref 0–1)
Eosinophils Absolute: 0.1 10*3/uL (ref 0.0–0.7)
Eosinophils Relative: 1 % (ref 0–5)
Hemoglobin: 9.4 g/dL — ABNORMAL LOW (ref 13.0–17.0)
Lymphs Abs: 0.4 10*3/uL — ABNORMAL LOW (ref 0.7–4.0)
MCH: 29.6 pg (ref 26.0–34.0)
MCHC: 34.9 g/dL (ref 30.0–36.0)
MCV: 84.6 fL (ref 78.0–100.0)
Monocytes Absolute: 1 10*3/uL (ref 0.1–1.0)
Monocytes Relative: 13 % — ABNORMAL HIGH (ref 3–12)
Neutrophils Relative %: 81 % — ABNORMAL HIGH (ref 43–77)
RBC: 3.18 MIL/uL — ABNORMAL LOW (ref 4.22–5.81)
RDW: 18 % — ABNORMAL HIGH (ref 11.5–15.5)

## 2013-02-10 LAB — COMPREHENSIVE METABOLIC PANEL
ALT: 21 U/L (ref 0–53)
AST: 23 U/L (ref 0–37)
Alkaline Phosphatase: 103 U/L (ref 39–117)
BUN: 19 mg/dL (ref 6–23)
CO2: 21 mEq/L (ref 19–32)
GFR calc Af Amer: >90 mL/min (ref >90)
Glucose, Bld: 104 mg/dL — ABNORMAL HIGH (ref 70–99)
Potassium: 2.9 mEq/L — ABNORMAL LOW (ref 3.5–5.1)
Sodium: 134 mEq/L — ABNORMAL LOW (ref 135–145)
Total Protein: 4.9 g/dL — ABNORMAL LOW (ref 6.0–8.3)

## 2013-02-10 LAB — HEMOGLOBIN
Hemoglobin: 9.1 g/dL — ABNORMAL LOW (ref 13.0–17.0)
Hemoglobin: 9.3 g/dL — ABNORMAL LOW (ref 13.0–17.0)

## 2013-02-10 MED ORDER — SODIUM CHLORIDE 0.9 % IJ SOLN
10.0000 mL | INTRAMUSCULAR | Status: DC | PRN
  Administered 2013-02-11 – 2013-02-13 (×4): 10 mL

## 2013-02-10 MED ORDER — POTASSIUM CHLORIDE CRYS ER 20 MEQ PO TBCR
30.0000 meq | EXTENDED_RELEASE_TABLET | ORAL | Status: AC
  Administered 2013-02-10 (×2): 30 meq via ORAL
  Filled 2013-02-10 (×2): qty 1

## 2013-02-10 MED ORDER — IOHEXOL 350 MG/ML SOLN
100.0000 mL | Freq: Once | INTRAVENOUS | Status: AC | PRN
  Administered 2013-02-10: 100 mL via INTRAVENOUS

## 2013-02-10 MED ORDER — METOPROLOL TARTRATE 12.5 MG HALF TABLET
12.5000 mg | ORAL_TABLET | Freq: Two times a day (BID) | ORAL | Status: DC
  Administered 2013-02-10 – 2013-02-14 (×9): 12.5 mg via ORAL
  Filled 2013-02-10 (×12): qty 1

## 2013-02-10 MED ORDER — ALIGN PO CAPS
1.0000 | ORAL_CAPSULE | Freq: Three times a day (TID) | ORAL | Status: DC
  Administered 2013-02-11 – 2013-02-14 (×8): 1 via ORAL
  Filled 2013-02-10 (×14): qty 1

## 2013-02-10 NOTE — Progress Notes (Signed)
GI ATTENDING-addendum  1. Patient has had recurrent GI bleeding. Hemodynamically stable 2. Biopsies do confirm invasive adenocarcinoma. 3. I discussed the patient's case with interventional radiology, Dr. Darcus Pester. He or his team will see the patient in consultation regarding possible embolization therapy. Likely we will want CT first. 4. Will transfuse 2 units of blood 5. Oncology call earlier today to see patient 6. I discussed all the above with the patient personally.  Austin Escobar. Eda Keys., M.D. [facility] Division of Gastroenterology

## 2013-02-10 NOTE — Progress Notes (Signed)
[facility] Progress Note  Subjective:  Says that BM today looked quite normal without blood.  Eating and tolerating diet.  Objective:  Vital signs in last 24 hours: Temp:  [97 F (36.1 C)-100 F (37.8 C)] 100 F (37.8 C) (12/10 0800) Pulse Rate:  [80-106] 95 (12/10 0600) Resp:  [7-24] 20 (12/10 0600) BP: (110-158)/(63-105) 156/93 mmHg (12/10 0800) SpO2:  [95 %-100 %] 96 % (12/10 0600) Weight:  [137 lb 2 oz (62.2 kg)] 137 lb 2 oz (62.2 kg) (12/10 0400) Last BM Date: 02/09/13 General:  Alert, Well-developed, in NAD Heart:  Regular rate and rhythm; no murmurs Pulm:  CTAB.  No W/R/R. Abdomen:  Soft, non-distended.  BS present.  Non-tender. Extremities:  Without edema. Neurologic:  Alert and  oriented x4;  grossly normal neurologically. Psych:  Alert and cooperative. Normal mood and affect.  Intake/Output from previous day: 12/09 0701 - 12/10 0700 In: 2897.1 [P.O.:540; I.V.:995; Blood:1362.1] Out: -  Lab Results:  Recent Labs  02/08/13 0935  02/09/13 0445 02/09/13 0955 02/10/13 0445  WBC 8.1  --  9.1  --  7.7  HGB 7.9*  < > 7.7* 7.9* 9.4*  HCT 23.6*  --  22.5*  --  26.9*  PLT 120*  --  70*  --  54*  < > = values in this interval not displayed. 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  Biopsies taken and pending. #2 UGIB secondary to #1 #3 ABLA: S/p 6 units PRBC's since admission.  Hgb 9.4 grams this AM. #4 Saddle pulmonary embolus diagnosed 12/2012 and was  on lovenox at home, however, that has been on hold due to bleeding.  #5 Metastatic pancreatic cancer s/p 2 lines of chemo with further progression of disease.  #6 Severe diverticulosis seen on colonoscopy 04/2012.  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  -Continue BID PPI IV.  -Await biopsies.  I contacted Dr. Gustavo Lah office and he will see the patient today or tomorrow morning. -? Need for IR embolization if he continues to bleed/re-bleeds.  Need oncology's input on resuming blood thinners vs other options, ie, filter, etc.    LOS: 2 days   ZEHR, JESSICA D.  02/10/2013, 8:58 AM  Pager number [phone]   GI ATTENDING  Interval history and laboratories reviewed. Patient personally seen and examined with extender. Agree with above. He reports more normal-looking BM. I reviewed the endoscopic findings. Biopsies pending. Hemoglobin stable. Overall prognosis obviously poor. Agree with renal involving oncology at this point. There is a need for vena cava filter? Is a role for embolization should he rebleed? Hospice and end-of-life issues?  Wilhemina Bonito. Eda Keys., M.D. [facility]
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
## 2013-02-10 NOTE — Progress Notes (Addendum)
[facility] PROGRESS NOTE  Austin Escobar AOZ:[id number] DOB: Nov 23, 1955 DOA: 02/08/2013 PCP: [name], DO  Brief narrative: 57 y.o. male with past medical history significant for severe sigmoid diverticulosis, removal of multiple colon polyps by colonoscopy on 04/22/2012, metastatic pancreatic cancer, status post chemotherapy, history of saddle pulmonary embolism diagnosed 12/24/2012 and was on Lovenox outpatient, atrial fibrillation, hypertension who presented to [facility] ED 02/08/2013 with reports of dark and bloody bowel movement, several episodes which started few days prior to this admission. In ED, patient's hemoglobin was 6.9 and he has initially received 3 units of PRBC. 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 Patient additionally received 3 units of PRBC.  Assessment/Plan:  Principal problem: Acute blood loss anemia  - Based on EGD performed 02/09/2013 likely duodenal bulb bleeding from tumor infiltration - Patient has received a total of 6 units of PRBC since the admission - Hemoglobin is now 9.4 - Continue Protonix 40 mg IV every 12 hours - Appreciate GI following - Continue to monitor CBC daily  Active problems: Atrial fibrillation  - 12-lead EKG repeated today and shows sinus tachycardia - Patient will be transferred to telemetry floor today HTN  - Blood pressure 156/93 this morning with tachycardia of 106 - Will start low-dose beta blocker, metoprolol 12.5 mg BID Pancreatic cancer (metastatic)  - Patient is aware of his poor prognosis, and if still hoping to be accepted in a trial at [facility] by Dr. [name] (oncologist).  - Continue pancreatic lipases supplementation Pulmonary saddle embolus  - Due to risk of GI bleed, patient is off of anticoagulation for right now Hypokalemia  - Repleted with potassium by mouth  supplement    Code Status: full code Family Communication: no family at the bedside Disposition Plan: Transfer to telemetry floor  Manson Passey, MD  [facility] Pager [phone]  If 7PM-7AM, please contact night-coverage [email or web address] Password [email or web address] 02/10/2013, 11:05 AM   LOS: 2 days   Consultants:  Gastroenterology (Dr. Yancey Flemings)  Procedures: EGD 02/09/2013 - 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 GI bleeding from the bulb second duodenum junction consistent with tumor infiltration.   Antibiotics:  None   HPI/Subjective: No acute overnight events. Patient reports no further dark or bloody bowel movement.  Objective: Filed Vitals:   02/10/13 0400 02/10/13 0500 02/10/13 0600 02/10/13 0800  BP: 151/81 135/82 141/81 156/93  Pulse: 92 92 95   Temp: 99.5 F (37.5 C)   100 F (37.8 C)  TempSrc: Oral   Axillary  Resp: 19 17 20    Height:      Weight: 62.2 kg (137 lb 2 oz)     SpO2: 97% 96% 96%     Intake/Output Summary (Last 24 hours) at 02/10/13 1105 Last data filed at 02/10/13 1000  Gross per 24 hour  Intake 3084.59 ml  Output      0 ml  Net 3084.59 ml    Exam:   General:  Pt is alert, follows commands appropriately, not in acute distress  Cardiovascular: Tachycardic, S1/S2 appreciated  Respiratory: Clear to auscultation bilaterally, no wheezing, no crackles, no rhonchi  Abdomen: Soft, non tender, non distended, bowel sounds present, no guarding  Extremities: No edema, pulses DP and PT palpable bilaterally  Neuro: Grossly nonfocal  Data Reviewed: Basic Metabolic Panel:  Recent Labs Lab 02/08/13 0935 02/09/13 0445 02/10/13 0445  NA 132* 137 134*  K  4.0 3.9 2.9*  CL 100 107 104  CO2 23 22 21   GLUCOSE 165* 117* 104*  BUN 26* 28* 19  CREATININE 0.95 0.98 0.80  CALCIUM 9.3 8.3* 7.7*  MG  --  1.8 1.6   Liver Function Tests:  Recent Labs Lab 02/08/13 0935 02/09/13 0445 02/10/13 0445  AST 25 24 23    ALT 22 20 21   ALKPHOS 117 88 103  BILITOT 0.3 0.5 0.4  PROT 6.2 4.9* 4.9*  ALBUMIN 2.9* 2.4* 2.4*    Recent Labs Lab 02/08/13 0935  LIPASE 28   No results found for this basename: AMMONIA,  in the last 168 hours CBC:  Recent Labs Lab 02/08/13 0935 02/08/13 1230 02/09/13 0016 02/09/13 0445 02/09/13 0955 02/10/13 0445  WBC 8.1  --   --  9.1  --  7.7  NEUTROABS 6.7  --   --  6.4  --  6.2  HGB 7.9* 6.9* 9.1* 7.7* 7.9* 9.4*  HCT 23.6*  --   --  22.5*  --  26.9*  MCV 92.9  --   --  87.5  --  84.6  PLT 120*  --   --  70*  --  54*   Cardiac Enzymes:  Recent Labs Lab 02/08/13 1230  TROPONINI <0.30   BNP: No components found with this basename: POCBNP,  CBG: No results found for this basename: GLUCAP,  in the last 168 hours  Recent Results (from the past 240 hour(s))  MRSA PCR SCREENING     Status: None   Collection Time    02/08/13 11:20 AM      Result Value Range Status   MRSA by PCR NEGATIVE  NEGATIVE Final     Studies: No results found.  Scheduled Meds: . bifidobacterium infantis  3 capsule Oral Daily  . lipase/protease/amylase  2 capsule Oral TID AC & HS  . pantoprazole (PROTONIX) IV  40 mg Intravenous Q12H  . potassium chloride  30 mEq Oral Q4H   Continuous Infusions: . sodium chloride 1,000 mL (02/10/13 0925)  . sodium chloride 75 mL/hr at 02/09/13 2124

## 2013-02-11 ENCOUNTER — Ambulatory Visit
Admit: 2013-02-11 | Discharge: 2013-02-11 | Disposition: A | Discharge status: Home / Self Care | Payer: BC Managed Care – PPO | Attending: Radiation Oncology | Admitting: Radiation Oncology

## 2013-02-11 ENCOUNTER — Encounter (HOSPITAL_COMMUNITY): Payer: Self-pay | Admitting: Radiology

## 2013-02-11 ENCOUNTER — Inpatient Hospital Stay (HOSPITAL_COMMUNITY): Payer: BC Managed Care – PPO

## 2013-02-11 DIAGNOSIS — K921 Melena: Secondary | ICD-10-CM

## 2013-02-11 DIAGNOSIS — C259 Malignant neoplasm of pancreas, unspecified: Secondary | ICD-10-CM | POA: Insufficient documentation

## 2013-02-11 DIAGNOSIS — C250 Malignant neoplasm of head of pancreas: Secondary | ICD-10-CM

## 2013-02-11 DIAGNOSIS — Z510 Encounter for antineoplastic radiation therapy: Secondary | ICD-10-CM | POA: Insufficient documentation

## 2013-02-11 LAB — HEMOGLOBIN
Hemoglobin: 10.3 g/dL — ABNORMAL LOW (ref 13.0–17.0)
Hemoglobin: 10.5 g/dL — ABNORMAL LOW (ref 13.0–17.0)

## 2013-02-11 LAB — COMPREHENSIVE METABOLIC PANEL
ALT: 21 U/L (ref 0–53)
Alkaline Phosphatase: 88 U/L (ref 39–117)
BUN: 14 mg/dL (ref 6–23)
CO2: 22 mEq/L (ref 19–32)
Calcium: 8.5 mg/dL (ref 8.4–10.5)
GFR calc Af Amer: >90 mL/min (ref >90)
GFR calc non Af Amer: >90 mL/min (ref >90)
Glucose, Bld: 115 mg/dL — ABNORMAL HIGH (ref 70–99)
Potassium: 3.3 mEq/L — ABNORMAL LOW (ref 3.5–5.1)
Sodium: 133 mEq/L — ABNORMAL LOW (ref 135–145)
Total Bilirubin: 0.5 mg/dL (ref 0.3–1.2)
Total Protein: 5 g/dL — ABNORMAL LOW (ref 6.0–8.3)

## 2013-02-11 LAB — CBC WITH DIFFERENTIAL/PLATELET
Basophils Absolute: 0 10*3/uL (ref 0.0–0.1)
Eosinophils Absolute: 0.1 10*3/uL (ref 0.0–0.7)
Hemoglobin: 9.5 g/dL — ABNORMAL LOW (ref 13.0–17.0)
Lymphocytes Relative: 8 % — ABNORMAL LOW (ref 12–46)
Lymphs Abs: 0.5 10*3/uL — ABNORMAL LOW (ref 0.7–4.0)
MCH: 29.2 pg (ref 26.0–34.0)
MCHC: 34.1 g/dL (ref 30.0–36.0)
MCV: 85.8 fL (ref 78.0–100.0)
Monocytes Absolute: 1 10*3/uL (ref 0.1–1.0)
Neutrophils Relative %: 73 % (ref 43–77)
Platelets: 48 10*3/uL — ABNORMAL LOW (ref 150–400)
RBC: 3.25 MIL/uL — ABNORMAL LOW (ref 4.22–5.81)
RDW: 17.1 % — ABNORMAL HIGH (ref 11.5–15.5)

## 2013-02-11 LAB — MAGNESIUM: Magnesium: 1.7 mg/dL (ref 1.5–2.5)

## 2013-02-11 MED ORDER — FENTANYL CITRATE 0.05 MG/ML IJ SOLN
INTRAMUSCULAR | Status: AC | PRN
  Administered 2013-02-11 (×2): 50 ug via INTRAVENOUS

## 2013-02-11 MED ORDER — MIDAZOLAM HCL 2 MG/2ML IJ SOLN
INTRAMUSCULAR | Status: AC
  Filled 2013-02-11: qty 6

## 2013-02-11 MED ORDER — LIDOCAINE HCL 1 % IJ SOLN
INTRAMUSCULAR | Status: AC
  Filled 2013-02-11: qty 20

## 2013-02-11 MED ORDER — FENTANYL CITRATE 0.05 MG/ML IJ SOLN
INTRAMUSCULAR | Status: AC
  Filled 2013-02-11: qty 6

## 2013-02-11 MED ORDER — MIDAZOLAM HCL 2 MG/2ML IJ SOLN
INTRAMUSCULAR | Status: AC | PRN
  Administered 2013-02-11 (×2): 1 mg via INTRAVENOUS

## 2013-02-11 MED ORDER — IOHEXOL 300 MG/ML  SOLN: 50.0000 mL | Freq: Once | INTRAMUSCULAR | Status: AC | PRN

## 2013-02-11 NOTE — Progress Notes (Signed)
[facility] Progress Note  Subjective: CTA yesterday showed the following:  IMPRESSION:  1. Significant progression of metastatic pancreatic carcinoma with  enlargement of the mass at the level of the head of the pancreas and progression of hepatic metastatic disease.  2. The pancreatic head mass has now caused vascular encasement in  the porta hepatis resulting in portal vein thrombus and near  occlusion of the left intrahepatic portal vein. Tumor also encases  and narrows the common hepatic artery, right and left hepatic  arteries and gastroduodenal artery. There also is tumor involvement at the level of the superior mesenteric vein.  3. 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  IR saw and evaluated the patient.  Oncology, Dr. [name], saw him as well and order IVF filter, which was placed uneventfully today.  No further bleeding since the episodes early yesterday afternoon.  No abdominal pain.  Eating lunch.   Objective:  Vital signs in last 24 hours: Temp:  [98 F (36.7 C)-99.5 F (37.5 C)] 98 F (36.7 C) (12/11 1240) Pulse Rate:  [68-107] 76 (12/11 1240) Resp:  [15-23] 18 (12/11 1240) BP: (109-171)/(68-99) 110/88 mmHg (12/11 1240) SpO2:  [97 %-100 %] 98 % (12/11 1240) Last BM Date: 02/10/13 General:  Alert, Well-developed, in NAD Heart:  Regular rate and rhythm; no murmurs Pulm:  CTAB.  No W/R/R. Abdomen:  Soft, non-distended.  BS present.  Non-tender.   Extremities:  Without edema. Neurologic:  Alert and  oriented x4;  grossly normal neurologically. Psych:  Alert and cooperative. Normal mood and affect.  Intake/Output from previous day: 12/10 0701 - 12/11 0700 In: 1240 [P.O.:240; I.V.:1000] Out: -   Lab Results:  Recent Labs  02/09/13 0445  02/10/13 0445  02/10/13 1743 02/10/13 1955 02/11/13 0625  WBC 9.1  --  7.7  --   --   --  6.0  HGB 7.7*  < > 9.4*  < > 9.3* 8.3* 9.5*  HCT 22.5*  --  26.9*  --   --   --  27.9*  PLT 70*  --  54*  --   --    --  48*  < > = values in this interval not displayed. BMET  Recent Labs  02/09/13 0445 02/10/13 0445 02/11/13 0625  NA 137 134* 133*  K 3.9 2.9* 3.3*  CL 107 104 105  CO2 22 21 22   GLUCOSE 117* 104* 115*  BUN 28* 19 14  CREATININE 0.98 0.80 0.93  CALCIUM 8.3* 7.7* 8.5   LFT  Recent Labs  02/11/13 0625  PROT 5.0*  ALBUMIN 2.3*  AST 24  ALT 21  ALKPHOS 88  BILITOT 0.5   Ct Angio Abd/pel W/ And/or W/o  02/11/2013   CLINICAL DATA:  Metastatic pancreatic carcinoma with GI bleed due to duodenal invasion. CT angiography is performed to assess vasculature prior to potential arteriography and transcatheter embolization to treat persistent bleeding.  EXAM: CT ANGIOGRAPHY ABDOMEN AND PELVIS WITH CONTRAST AND WITHOUT CONTRAST  TECHNIQUE: Multidetector CT imaging of the abdomen and pelvis was performed using the standard protocol during bolus administration of intravenous contrast. Multiplanar reconstructed images including MIPs were obtained and reviewed to evaluate the vascular anatomy.  CONTRAST:  OMNIPAQUE IOHEXOL 350 MG/ML SOLN  COMPARISON:  12/23/2012  FINDINGS: There is significant enlargement of the mass involving the head of the pancreas since the prior CT in October. Dimensions of the mass are now approximately 3.7 x 5.6 x 4.5 cm. The mass visibly encases the pre-existing biliary stent  and also extends further to encase the gastroduodenal artery, portal venous confluence, hepatic artery and a segment of the superior mesenteric vein. 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 Portal flow is present in the right lobe.  Significant progression of metastatic disease in the liver is also identified in both left and right lobes. Index lesion in the inferior right lobe shows enlargement with dimensions of 2.5 x 3.5 cm (1.6 x 2.1 cm  previously). The largest left lobe lesion has enlarged from a maximum diameter of 2.4 cm previously to estimated current maximum diameter of 4.0 cm. All previously identified metastatic lesions have increased significantly in size. There are at least 4 new metastatic lesions identified in the liver. There is no evidence of associated biliary obstruction with air remaining a biliary tree. The common bile duct stent lumen does not appear to be compromised.  There is new ascites with mild to moderate overall volume of ascites present as well as nodularity of the mesenteric and omentum suspicious for carcinomatosis. No bowel obstruction or perforation is identified.  Arterial vasculature evaluated by CTA shows no evidence of significant occlusive disease. The aorta and major visceral branches are normally patent. At the level of the celiac trunk, the common hepatic artery is narrowed and encased by tumor but remains open with flow noted in both right and left hepatic arteries. The gastroduodenal artery is open but encased by tumor. Small distal GDA branches are present supplying the pancreatic mass. There also appears to be pancreaticoduodenal supply from the SMA at the level of the pancreatic mass.  The superior mesenteric artery abuts tumor but is not visibly narrowed. Focal nonocclusive thrombus or direct tumor invasion is identified in the superior aspect of the superior mesenteric vein causing luminal stenosis of approximately 60%. The splenic vein remains open.  Bilateral renal artery show normal patency. The inferior mesenteric vein is open. Bilateral iliac and common femoral arteries show normal patency.  Appearance of the gallbladder and kidneys are unremarkable. The spleen shows a new focal wedge-shaped infarct in its posterior and inferior aspect. The rest of the spleen shows normal perfusion. There is a stable left adrenal nodule. Bony structures show degenerative changes of the spine without evidence of  focal lesions.  Review of the MIP images confirms the above findings.  IMPRESSION: 1. Significant progression of metastatic pancreatic carcinoma with enlargement of the mass at the level of the head of the pancreas and progression of hepatic metastatic disease. 2. 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 Tumor also encases and narrows the common hepatic artery, right and left hepatic arteries and gastroduodenal artery. There also is tumor involvement at the level of the superior mesenteric vein. 3. 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   Electronically Signed   By: Irish Lack M.D.   On: 02/11/2013 10:34    Assessment / Plan: #1 Duodenal infiltration by pancreatic mass seen on EGD 12/9 causing bleeding, but epi injected with transient hemostasis. Biopsies confirmed adenocarcinoma.  #2 UGIB secondary to #1:  Recurrently bleeding yesterday and IR consult for opinion on arteriography and embolization.  At this point they are going to monitor for further bleeding prior to performed GDA embolization (not guarantee hemostasis as there are several arterial branches  encased in the tumor, but may decrease pressure).  Also, they would need his platelet count improved if it comes to performing this.  #3 ABLA: S/p 8 units PRBC's since admission. Hgb 9.5 grams this AM (after receiving two more units overnight).  #4 Saddle pulmonary embolus diagnosed 12/2012 and was on lovenox at home, however, that has been on hold due to bleeding.  IVC filter placed today.  #5 Metastatic pancreatic cancer s/p 2 lines of chemo with significant progression of disease.  #6 Severe diverticulosis seen on colonoscopy 04/2012.  #7 Non-bleeding esophageal varices also seen on EGD 12/9.  PVT on CTA seen yesterday.   -Close monitoring of Hgb and transfuse further prn.  -Continue BID PPI IV.  -Appreciate  oncology and IR seeing and following the patient.  No further intervention from GI standpoint. -? Goals of care consult.      LOS: 3 days   ZEHR, JESSICA D.  02/11/2013, 12:50 PM  Pager number [phone]   GI ATTENDING  History, laboratories, interval events noted. Fortunately, no bleeding today. He had IVC filter placed without issues. Has been seen by interventional radiology should the need arise for embolization. The potential limitations outlined. Also, has been seen by radiation oncology and is for radiotherapy of the pancreas. Nothing further at this point from GI medicine. We're available as needed. Resume care with his oncology team. Will sign off  Brinden Kincheloe N. Eda Keys., M.D. [facility] Division of Gastroenterology

## 2013-02-11 NOTE — Progress Notes (Signed)
  Radiation Oncology         [phone] ________________________________  Name: Austin Escobar MRN: [id number]  Date: 02/11/2013  DOB: 07-03-1955  SIMULATION AND TREATMENT PLANNING NOTE  DIAGNOSIS:  Pancreatic cancer  NARRATIVE:  The patient was brought to the CT Simulation planning suite.  Identity was confirmed.  All relevant records and images related to the planned course of therapy were reviewed.   Written consent to proceed with treatment was confirmed which was freely given after reviewing the details related to the planned course of therapy had been reviewed with the patient.  Then, the patient was set-up in a stable reproducible  supine position for radiation therapy.  CT images were obtained.  Surface markings were placed.  A customized VAC lock bag was constructed to help with patient immobilization each fraction. This complex treatment device will be used daily.  The CT images were loaded into the planning software.  Then the target and avoidance structures were contoured.  Treatment planning then occurred.  The radiation prescription was entered and confirmed.  A total of 4 complex treatment devices were fabricated which relate to the designed radiation treatment fields. Each of these customized fields/ complex treatment devices will be used on a daily basis during the radiation course. I have requested : 3D Simulation  I have requested a DVH of the following structures: Gross tumor volume, kidneys bilaterally, liver, spinal cord.   PLAN:  The patient will receive 30 Gy in 10 fractions.  ________________________________   Radene Gunning, MD, PhD
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
## 2013-02-11 NOTE — Consult Note (Signed)
Thank you for let me know about Mr.Redwine's admission. I have been taking care of him for metastatic pancreatic cancer. We sent him to [facility] for protocol therapy. Unfortunately, he has not yet been able to be put into a trial.  He had a pulmonary embolism. He is on Lovenox for this.  He developed a GI bleed. He was admitted on December eighth. His Lovenox was stopped.  Upper endoscopy showed a mass eroding into was the duodenum. Radiology is seen him for the possibility of utilizing the arteries.  I believe he will need radiation therapy to this area to try to "scar down" the mass.  He was not responsive to 2 lines of chemotherapy.  He will need to have a filter put in to the IVC.  I spoke to his oncologist at [facility]. She agrees with what we need to do for the GI bleeding right now. She's not sure when they would have a clinical follow up for him.  He still looks quite good. He's been eating. His weight has been holding pretty stable.  Pain has not been much of a problem for him. He's had no nausea vomiting. We do have  him on pancreatic enzyme replacement therapy.  He's had no hematemesis. He's had no hematuria. There's been no leg swelling.   The only potential complication I can see is that he had a seminoma of the right testicle back in 2003. He did have some radiation therapy after this. This is at [facility]. As such, the radiation oncologist should have records of this.  We will continue to follow him along.  On physical exam, temperature 98.3. Pulse 70. Blood pressure 171/91. Lungs are clear. Cardiac exam regular rate and rhythm. Abdomen is soft. He has good bowel sounds. There is no tenderness. Extremities shows no clubbing cyanosis or edema. Skin exam shows no rashes. Neurological exam shows no focal neurological deficits.  I'm not sure as to why his platelet counts going down. When he was admitted, his platelet count was 120. I suppose this might be reflective of  bleeding. He's not on any medication that would be doing this.  We will need to watch his platelet count closely. I don't see any problems with his renal function. Liver function looks okay.  We will help out. I appreciate all the outstanding care that he has received from the hospitalist, gastroenterology, radiology, and most importantly the staff on [address]  Pete E.

## 2013-02-11 NOTE — Progress Notes (Signed)
[facility] PROGRESS NOTE  Austin Escobar ZOX:[id number] DOB: May 19, 1955 DOA: 02/08/2013 PCP: [name], DO  Brief narrative: 57 y.o. male with past medical history significant for severe sigmoid diverticulosis, removal of multiple colon polyps by colonoscopy on 04/22/2012, metastatic pancreatic cancer, status post chemotherapy, history of saddle pulmonary embolism diagnosed 12/24/2012 and was on Lovenox outpatient, atrial fibrillation, hypertension who presented to [facility] ED 02/08/2013 with reports of dark and bloody bowel movement, several episodes which started few days prior to this admission.  In ED, patient's hemoglobin was 6.9 and he has initially received 3 units of PRBC. 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 Patient additionally received 3 units of PRBC.   Assessment/Plan:   Principal problem:  Acute blood loss anemia  - Based on EGD performed 02/09/2013 likely duodenal bulb bleeding from tumor infiltration; plan per GI and IR is for possible arteriography with embolization, will follow up on final recommendations  - Patient has received a total of 6 units of PRBC since the admission  - Hemoglobin is now 9.5 - Continue Protonix 40 mg IV every 12 hours  - Appreciate GI following  - Continue to monitor CBC daily  Active problems:  Atrial fibrillation  - 12-lead EKG repeated and showed sinus tachycardia  HTN  - We started low-dose beta blocker, metoprolol 12.5 mg BID and BP stable at 110/88 with HR of 76 Pancreatic cancer (metastatic)  - Patient is aware of his poor prognosis, and if still hoping to be accepted in a trial at [facility] by Dr. [name] (oncologist).  - Continue pancreatic lipases supplementation  Pulmonary saddle embolus  - Due to risk of GI bleed, patient is off of anticoagulation for right now  Hypokalemia   - Repleted with potassium by mouth supplement   Code Status: full code  Family Communication: no family at the bedside  Disposition Plan: Remains inpatient, home once stable  Consultants:   Gastroenterology (Dr. Yancey Flemings)  Interventional radiology  Procedures:   EGD 02/09/2013 - 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 GI bleeding from the bulb second duodenum junction consistent with tumor infiltration.  Antibiotics:  None    Manson Passey, MD  [facility] Pager [phone]  If 7PM-7AM, please contact night-coverage [email or web address] Password [email or web address] 02/11/2013, 6:54 AM   LOS: 3 days    HPI/Subjective: No acute overnight events.   Objective: Filed Vitals:   02/11/13 0300 02/11/13 0400 02/11/13 0500 02/11/13 0526  BP: 120/69 122/69 123/74 171/91  Pulse: 86 85 83 70  Temp: 98.3 F (36.8 C) 99.3 F (37.4 C) 98.7 F (37.1 C) 98.3 F (36.8 C)  TempSrc: Oral Oral Oral Oral  Resp: 18 18 18 18   Height:      Weight:      SpO2:   97% 98%    Intake/Output Summary (Last 24 hours) at 02/11/13 0654 Last data filed at 02/11/13 0500  Gross per 24 hour  Intake   1315 ml  Output      0 ml  Net   1315 ml    Exam:   General:  Pt is alert, follows commands appropriately, not in acute distress  Cardiovascular: Regular rate and rhythm, S1/S2 appreciated   Respiratory: Clear to auscultation bilaterally, no wheezing, no crackles, no rhonchi  Abdomen: Soft, non tender, non distended, bowel sounds present, no guarding  Extremities: No edema, pulses DP and PT palpable  bilaterally  Neuro: Grossly nonfocal  Data Reviewed: Basic Metabolic Panel:  Recent Labs Lab 02/08/13 0935 02/09/13 0445 02/10/13 0445  NA 132* 137 134*  K 4.0 3.9 2.9*  CL 100 107 104  CO2 23 22 21   GLUCOSE 165* 117* 104*  BUN 26* 28* 19  CREATININE 0.95 0.98 0.80  CALCIUM 9.3 8.3* 7.7*  MG  --  1.8 1.6   Liver Function Tests:  Recent Labs Lab  02/08/13 0935 02/09/13 0445 02/10/13 0445  AST 25 24 23   ALT 22 20 21   ALKPHOS 117 88 103  BILITOT 0.3 0.5 0.4  PROT 6.2 4.9* 4.9*  ALBUMIN 2.9* 2.4* 2.4*    Recent Labs Lab 02/08/13 0935  LIPASE 28   No results found for this basename: AMMONIA,  in the last 168 hours CBC:  Recent Labs Lab 02/08/13 0935  02/09/13 0445 02/09/13 0955 02/10/13 0445 02/10/13 1155 02/10/13 1743 02/10/13 1955  WBC 8.1  --  9.1  --  7.7  --   --   --   NEUTROABS 6.7  --  6.4  --  6.2  --   --   --   HGB 7.9*  < > 7.7* 7.9* 9.4* 9.8* 9.3* 8.3*  HCT 23.6*  --  22.5*  --  26.9*  --   --   --   MCV 92.9  --  87.5  --  84.6  --   --   --   PLT 120*  --  70*  --  54*  --   --   --   < > = values in this interval not displayed. Cardiac Enzymes:  Recent Labs Lab 02/08/13 1230  TROPONINI <0.30   BNP: No components found with this basename: POCBNP,  CBG: No results found for this basename: GLUCAP,  in the last 168 hours  Recent Results (from the past 240 hour(s))  MRSA PCR SCREENING     Status: None   Collection Time    02/08/13 11:20 AM      Result Value Range Status   MRSA by PCR NEGATIVE  NEGATIVE Final   Comment:            The GeneXpert MRSA Assay (FDA     approved for NASAL specimens     only), is one component of a     comprehensive MRSA colonization     surveillance program. It is not     intended to diagnose MRSA     infection nor to guide or     monitor treatment for     MRSA infections.     Studies: No results found.  Scheduled Meds: . bifidobacterium infantis  1 capsule Oral TID  . lipase/protease/amylase  2 capsule Oral TID AC & HS  . metoprolol tartrate  12.5 mg Oral BID  . pantoprazole (PROTONIX) IV  40 mg Intravenous Q12H   Continuous Infusions: . sodium chloride 1,000 mL (02/10/13 1752)  . sodium chloride 75 mL/hr at 02/09/13 2124
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
## 2013-02-11 NOTE — Care Management Note (Addendum)
    Page 1 of 1   02/12/2013     12:37:00 PM   CARE MANAGEMENT NOTE 02/12/2013  Patient:  Austin Escobar, Austin Escobar   Account Number:  [id number]  Date Initiated:  02/08/2013  Documentation initiated by:  DAVIS,RHONDA  Subjective/Objective Assessment:   pt with rectal and gi bld hgb 6.9     Action/Plan:   home when stable   Anticipated DC Date:  02/13/2013   Anticipated DC Plan:  HOME/SELF CARE         Choice offered to / List presented to:             Status of service:  In process, will continue to follow Medicare Important Message given?   (If response is "NO", the following Medicare IM given date fields will be blank) Date Medicare IM given:   Date Additional Medicare IM given:    Discharge Disposition:    Per UR Regulation:  Reviewed for med. necessity/level of care/duration of stay  If discussed at Long Length of Stay Meetings, dates discussed:    Comments:  02/12/13 Sean Malinowski RN,BSN NCM [phone] ANEMIA-IV IRON.NO ANTICIPATED D/C NEEDS.  02/11/13 Nechama Escutia RN,BSN NCM [phone] TRANSFER FROM SDU.PANCREATIC CA METS,S/P IVC FILTER-PE.GI-SIGNED OFF.DYSPHAGIA 3 DIET,ONCOLOGY FOLLOWING.D/C PLAN HOME.  Bjorn Loser Davis,Rn,BSn,CCM

## 2013-02-11 NOTE — Procedures (Signed)
IVCgram: negative IVC filter placed, infrarenal, retrievable No complication No blood loss. See complete dictation in [facility] PACS  

## 2013-02-11 NOTE — Progress Notes (Signed)
[facility] Radiation Oncology Dept Therapy Treatment Record Phone [phone]   Radiation Therapy was administered to Delbert Harness on: 02/11/2013  6:25 PM and was treatment # 1 out of a planned course of 10 treatments.

## 2013-02-12 ENCOUNTER — Ambulatory Visit
Admit: 2013-02-12 | Discharge: 2013-02-12 | Disposition: A | Discharge status: Home / Self Care | Payer: BC Managed Care – PPO | Attending: Radiation Oncology | Admitting: Radiation Oncology

## 2013-02-12 DIAGNOSIS — C250 Malignant neoplasm of head of pancreas: Secondary | ICD-10-CM

## 2013-02-12 LAB — TYPE AND SCREEN
Antibody Screen: NEGATIVE
Unit division: 0
Unit division: 0
Unit division: 0
Unit division: 0
Unit division: 0

## 2013-02-12 LAB — HEMOGLOBIN AND HEMATOCRIT, BLOOD: Hemoglobin: 9.1 g/dL — ABNORMAL LOW (ref 13.0–17.0)

## 2013-02-12 LAB — CBC WITH DIFFERENTIAL/PLATELET
Basophils Absolute: 0 10*3/uL (ref 0.0–0.1)
Basophils Relative: 0 % (ref 0–1)
Eosinophils Relative: 3 % (ref 0–5)
HCT: 26.9 % — ABNORMAL LOW (ref 39.0–52.0)
Hemoglobin: 9.1 g/dL — ABNORMAL LOW (ref 13.0–17.0)
Lymphocytes Relative: 8 % — ABNORMAL LOW (ref 12–46)
MCH: 29.2 pg (ref 26.0–34.0)
MCHC: 33.8 g/dL (ref 30.0–36.0)
Monocytes Absolute: 0.7 10*3/uL (ref 0.1–1.0)
Monocytes Relative: 13 % — ABNORMAL HIGH (ref 3–12)
Neutro Abs: 3.9 10*3/uL (ref 1.7–7.7)
RBC: 3.12 MIL/uL — ABNORMAL LOW (ref 4.22–5.81)

## 2013-02-12 LAB — COMPREHENSIVE METABOLIC PANEL
ALT: 22 U/L (ref 0–53)
AST: 27 U/L (ref 0–37)
Albumin: 2.2 g/dL — ABNORMAL LOW (ref 3.5–5.2)
Alkaline Phosphatase: 87 U/L (ref 39–117)
BUN: 13 mg/dL (ref 6–23)
Calcium: 8.4 mg/dL (ref 8.4–10.5)
Creatinine, Ser: 0.88 mg/dL (ref 0.50–1.35)
GFR calc Af Amer: >90 mL/min (ref >90)
Glucose, Bld: 105 mg/dL — ABNORMAL HIGH (ref 70–99)
Sodium: 135 mEq/L (ref 135–145)
Total Bilirubin: 0.4 mg/dL (ref 0.3–1.2)
Total Protein: 5.1 g/dL — ABNORMAL LOW (ref 6.0–8.3)

## 2013-02-12 MED ORDER — POTASSIUM CHLORIDE CRYS ER 20 MEQ PO TBCR
40.0000 meq | EXTENDED_RELEASE_TABLET | Freq: Once | ORAL | Status: AC
  Administered 2013-02-12: 40 meq via ORAL
  Filled 2013-02-12: qty 2

## 2013-02-12 MED ORDER — FERUMOXYTOL INJECTION 510 MG/17 ML
1020.0000 mg | Freq: Once | INTRAVENOUS | Status: AC
  Administered 2013-02-12: 1020 mg via INTRAVENOUS
  Filled 2013-02-12: qty 34

## 2013-02-12 MED ORDER — SODIUM CHLORIDE 0.9 % IV SOLN
1000.0000 mL | INTRAVENOUS | Status: DC
  Administered 2013-02-12 – 2013-02-14 (×2): 1000 mL via INTRAVENOUS

## 2013-02-12 MED ORDER — FOLIC ACID 1 MG PO TABS
2.0000 mg | ORAL_TABLET | Freq: Every day | ORAL | Status: DC
  Administered 2013-02-12 – 2013-02-14 (×3): 2 mg via ORAL
  Filled 2013-02-12 (×3): qty 2

## 2013-02-12 MED ORDER — LORAZEPAM 1 MG PO TABS
1.0000 mg | ORAL_TABLET | Freq: Once | ORAL | Status: AC
  Administered 2013-02-12: 22:00:00 1 mg via ORAL
  Filled 2013-02-12: qty 1

## 2013-02-12 NOTE — Progress Notes (Signed)
Pt requesting something for anxiety due to the increased bloody bowel movements. Patient also expressed concern regarding keeping Dr. [name] in the loop about the bloody BM's restarting. Paged NP. Ativan 1 mg PO ordered and administered to the patient. Will continue to monitor.

## 2013-02-12 NOTE — Progress Notes (Signed)
Since shift change, patient has had two more large bloody bowel movements. The most recent bowel movement was watery with several blood clots. NP paged. H&H ordered for midnight. Last hemoglobin at 8pm was 9.1. Will continue to monitor . Pt will notify nurse if additional bowel movements are had.

## 2013-02-12 NOTE — Progress Notes (Signed)
Pt had a large bloody bowel movement that appeared to be dark and bright red in color and water in consistency. Pt c/o dizziness after the BM. Vital signs obtained: BP 114/76, HR 102, T 99.1, Resp 22, sats 95% on RA. Pt states that he "just doesn't feel good." MD made aware and orders received to obtain an H&H. Will pass along to night shift RN and ask her to follow up with on call MD if needed. Pt informed to save any further blood BM's for observation by the RN. Will continue to monitor.   Arta [facility] 02/12/2013 7:11 PM

## 2013-02-12 NOTE — Progress Notes (Signed)
Mr. Bastos is doing okay. He slept well last night. He did start radiation therapy yesterday. He also has the IVC filter placed. There is no bleeding from when he did see. He has no abdominal pain.  His platelet count is 54,000. I looked at his a blood smear and I do not see anything that looked suspicious. Is possible that with past chemotherapy, that there may be some bone marrow suppression that may occur with this acute bleeding.  I will start him on some folic acid. I will also get a dose of IV iron.  Is eating now. We can cut back on his IV fluids.  He had no temperature. Blood pressure is good at 120/70. His physical exam is unremarkable. Abdomen is soft. He has decent bowel sounds. Lungs are clear. Cardiac exam regular in rhythm. Extremities shows no clubbing cyanosis or edema  His electrolytes looked pretty good. Potassium is 3.3. He has normal renal function. His albumin was only 2.2. White cell count 5.2 platelet are 54,000. Hemoglobin 9.1.  I think that he be watched today. His platelet count continues to go up, that he probably can be discharged over the weekend.  Pete E.  1 Cor 13:13

## 2013-02-12 NOTE — Progress Notes (Signed)
[facility] PROGRESS NOTE  Austin Escobar OZH:[id number] DOB: 1955-08-09 DOA: 02/08/2013 PCP: [name], DO  Brief narrative: 57 y.o. male with past medical history significant for severe sigmoid diverticulosis, removal of multiple colon polyps by colonoscopy on 04/22/2012, metastatic pancreatic cancer, status post chemotherapy, history of saddle pulmonary embolism diagnosed 12/24/2012 and was on Lovenox outpatient, atrial fibrillation, hypertension who presented to [facility] ED 02/08/2013 with reports of dark and bloody bowel movement, several episodes which started few days prior to this admission.  In ED, patient's hemoglobin was 6.9 and he has initially received 3 units of PRBC. 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 Patient additionally received 3 units of PRBC. Pt cannot be on [facility] therapy due to risk of bleeding for which reason he underwent IVC filter placement. He also underwent palliative RT for pancreatic cancer.  Assessment/Plan:   Principal problem:  Acute blood loss anemia  - Based on EGD performed 02/09/2013 likely duodenal bulb bleeding from tumor infiltration - Patient has received a total of 6 units of PRBC since the admission  - underwent palliative RT 02/11/2013 - Hemoglobin remains stable at 9.1; one dose feraheme given today - Continue Protonix 40 mg IV every 12 hours  - Appreciate GI following   Active problems:  Atrial fibrillation  - Repeated 12 lead EKG showed sinus tachycardia; HR now WNL since we started metoprolol HTN  - Continue metoprolol 12.5 mg BID and BP stable at 110/88 with HR of 76  Pancreatic cancer (metastatic)  - Patient is aware of his poor prognosis, and if still hoping to be accepted in a trial at [facility] by Dr. [name] (oncologist).  - had palliative RT 02/11/2013 - Continue pancreatic  lipases supplementation  Pulmonary saddle embolus  - Due to risk of GI bleed, patient is off of anticoagulation for right now  - status post IVC filter placement 02/11/2013 Hypokalemia  - Repleted with potassium by mouth supplement   Code Status: full code  Family Communication: no family at the bedside  Disposition Plan: Remains inpatient, home once stable   Consultants:  Gastroenterology (Dr. Yancey Flemings)  Radiation oncology  Interventional radiology  Procedures:  EGD 02/09/2013 - 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 GI bleeding from the bulb second duodenum junction consistent with tumor infiltration.  Radiation therapy 02/11/2013 IVC filter placement 02/11/2013 Antibiotics:  None     Manson Passey, MD  [facility] Pager [phone]  If 7PM-7AM, please contact night-coverage [email or web address] Password [facility] 02/12/2013, 1:59 PM   LOS: 4 days    HPI/Subjective: No overnight events.  Objective: Filed Vitals:   02/11/13 1317 02/11/13 1345 02/11/13 2115 02/12/13 0500  BP: 116/71 112/80 123/75 122/70  Pulse: 82 78 94 89  Temp: 97.8 F (36.6 C) 98 F (36.7 C) 99 F (37.2 C) 98.9 F (37.2 C)  TempSrc: Oral Oral Oral Oral  Resp: 18 18 18 17   Height:      Weight:      SpO2: 100% 99% 98% 99%    Intake/Output Summary (Last 24 hours) at 02/12/13 1359 Last data filed at 02/12/13 1100  Gross per 24 hour  Intake   1220 ml  Output      0 ml  Net   1220 ml    Exam:   General:  Pt is alert, follows commands appropriately, not in acute distress  Cardiovascular: irregular rhythm, S1/S2 appreciated  Respiratory: Clear to auscultation bilaterally, no wheezing, no crackles, no rhonchi  Abdomen: Soft, non tender, non distended, bowel sounds present, no guarding  Extremities: No edema, pulses DP and PT palpable bilaterally  Neuro: Grossly nonfocal  Data Reviewed: Basic Metabolic Panel:  Recent Labs Lab 02/08/13 0935  02/09/13 0445 02/10/13 0445 02/11/13 0625 02/12/13 0445  NA 132* 137 134* 133* 135  K 4.0 3.9 2.9* 3.3* 3.3*  CL 100 107 104 105 105  CO2 23 22 21 22 21   GLUCOSE 165* 117* 104* 115* 105*  BUN 26* 28* 19 14 13   CREATININE 0.95 0.98 0.80 0.93 0.88  CALCIUM 9.3 8.3* 7.7* 8.5 8.4  MG  --  1.8 1.6 1.7 1.8   Liver Function Tests:  Recent Labs Lab 02/08/13 0935 02/09/13 0445 02/10/13 0445 02/11/13 0625 02/12/13 0445  AST 25 24 23 24 27   ALT 22 20 21 21 22   ALKPHOS 117 88 103 88 87  BILITOT 0.3 0.5 0.4 0.5 0.4  PROT 6.2 4.9* 4.9* 5.0* 5.1*  ALBUMIN 2.9* 2.4* 2.4* 2.3* 2.2*    Recent Labs Lab 02/08/13 0935  LIPASE 28   No results found for this basename: AMMONIA,  in the last 168 hours CBC:  Recent Labs Lab 02/08/13 0935  02/09/13 0445  02/10/13 0445  02/11/13 0625 02/11/13 1331 02/11/13 1740 02/11/13 2210 02/12/13 0445  WBC 8.1  --  9.1  --  7.7  --  6.0  --   --   --  5.2  NEUTROABS 6.7  --  6.4  --  6.2  --  4.4  --   --   --  3.9  HGB 7.9*  < > 7.7*  < > 9.4*  < > 9.5* 10.3* 10.5* 9.4* 9.1*  HCT 23.6*  --  22.5*  --  26.9*  --  27.9*  --   --   --  26.9*  MCV 92.9  --  87.5  --  84.6  --  85.8  --   --   --  86.2  PLT 120*  --  70*  --  54*  --  48*  --   --   --  54*  < > = values in this interval not displayed. Cardiac Enzymes:  Recent Labs Lab 02/08/13 1230  TROPONINI <0.30   BNP: No components found with this basename: POCBNP,  CBG: No results found for this basename: GLUCAP,  in the last 168 hours  Recent Results (from the past 240 hour(s))  MRSA PCR SCREENING     Status: None   Collection Time    02/08/13 11:20 AM      Result Value Range Status   MRSA by PCR NEGATIVE  NEGATIVE Final   Comment:            The GeneXpert MRSA Assay (FDA     approved for NASAL specimens     only), is one component of a     comprehensive MRSA colonization     surveillance program. It is not     intended to diagnose MRSA     infection nor to guide or      monitor treatment for     MRSA infections.     Studies: Ir Ivc Filter Plmt / S&i /img Guid/mod Sed  02/11/2013   CLINICAL DATA:  Metastatic pancreatic carcinoma. History of pulmonary embolism. GI bleeding, a relative contraindication to anticoagulation.  EXAM: INFERIOR VENACAVOGRAM  IVC FILTER PLACEMENT UNDER FLUOROSCOPY  TECHNIQUE: The procedure, risks (including but not limited to bleeding, infection, organ damage ), benefits, and alternatives were explained to the patient. Questions regarding the procedure were encouraged and answered. The patient understands and consents to the procedure. Caval anatomy reviewed on previous CT abdomen. Patency of the right IJ vein was confirmed with ultrasound with image documentation. An appropriate skin site was determined. Skin site was marked, prepped with chlorhexidine, and draped using maximum barrier technique. The region was infiltrated locally with 1% lidocaine.  Intravenous Fentanyl and Versed were administered as conscious sedation during continuous cardiorespiratory monitoring by the radiology RN, with a total moderate sedation time of less than 30 minutes.  Under real-time ultrasound guidance, the right IJ vein was accessed with a 21 gauge micropuncture needle; the needle tip within the vein was confirmed with ultrasound image documentation. The needle was exchanged over a 018 guidewire for a transitional dilator, which allow advancement of the [facility] wire into the IVC. A long 6 French vascular sheath was placed for inferior venacavography. This demonstrated no caval thrombus. Renal vein inflows were evident.  The [facility] IVC filter was advanced through the sheath and successfully deployed under fluoroscopy at the L2-3 disc level. Followup cavagram demonstrates stable filter position and no evident complication. The sheath was removed and hemostasis achieved at the site. No immediate complication.  FLUOROSCOPY TIME:  36 seconds  IMPRESSION: 1. Normal IVC. No  thrombus or significant anatomic variation. 2. Technically successful infrarenal IVC filter placement. This is a retrievable model.   Electronically Signed   By: Oley Balm M.D.   On: 02/11/2013 13:43   Ct Angio Abd/pel W/ And/or W/o  02/11/2013   CLINICAL DATA:  Metastatic pancreatic carcinoma with GI bleed due to duodenal invasion. CT angiography is performed to assess vasculature prior to potential arteriography and transcatheter embolization to treat persistent bleeding.  EXAM: CT ANGIOGRAPHY ABDOMEN AND PELVIS WITH CONTRAST AND WITHOUT CONTRAST  TECHNIQUE: Multidetector CT imaging of the abdomen and pelvis was performed using the standard protocol during bolus administration of intravenous contrast. Multiplanar reconstructed images including MIPs were obtained and reviewed to evaluate the vascular anatomy.  CONTRAST:  OMNIPAQUE IOHEXOL 350 MG/ML SOLN  COMPARISON:  12/23/2012  FINDINGS: There is significant enlargement of the mass involving the head of the pancreas since the prior CT in October. Dimensions of the mass are now approximately 3.7 x 5.6 x 4.5 cm. The mass visibly encases the pre-existing biliary stent and also extends further to encase the gastroduodenal artery, portal venous confluence, hepatic artery and a segment of the superior mesenteric vein. 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 Portal flow is present in the right lobe.  Significant progression of metastatic disease in the liver is also identified in both left and right lobes. Index lesion in the inferior right lobe shows enlargement with dimensions of 2.5 x 3.5 cm (1.6 x 2.1 cm previously). The largest left lobe lesion has enlarged from a maximum diameter of 2.4 cm previously to estimated current maximum diameter of 4.0 cm. All previously identified metastatic  lesions have increased significantly in size. There are at least 4 new metastatic lesions identified in the liver. There is no evidence of associated biliary obstruction with air remaining a biliary tree. The common bile duct stent lumen does not appear to be compromised.  There is new ascites with  mild to moderate overall volume of ascites present as well as nodularity of the mesenteric and omentum suspicious for carcinomatosis. No bowel obstruction or perforation is identified.  Arterial vasculature evaluated by CTA shows no evidence of significant occlusive disease. The aorta and major visceral branches are normally patent. At the level of the celiac trunk, the common hepatic artery is narrowed and encased by tumor but remains open with flow noted in both right and left hepatic arteries. The gastroduodenal artery is open but encased by tumor. Small distal GDA branches are present supplying the pancreatic mass. There also appears to be pancreaticoduodenal supply from the SMA at the level of the pancreatic mass.  The superior mesenteric artery abuts tumor but is not visibly narrowed. Focal nonocclusive thrombus or direct tumor invasion is identified in the superior aspect of the superior mesenteric vein causing luminal stenosis of approximately 60%. The splenic vein remains open.  Bilateral renal artery show normal patency. The inferior mesenteric vein is open. Bilateral iliac and common femoral arteries show normal patency.  Appearance of the gallbladder and kidneys are unremarkable. The spleen shows a new focal wedge-shaped infarct in its posterior and inferior aspect. The rest of the spleen shows normal perfusion. There is a stable left adrenal nodule. Bony structures show degenerative changes of the spine without evidence of focal lesions.  Review of the MIP images confirms the above findings.  IMPRESSION: 1. Significant progression of metastatic pancreatic carcinoma with enlargement of the mass at the level of  the head of the pancreas and progression of hepatic metastatic disease. 2. 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 Tumor also encases and narrows the common hepatic artery, right and left hepatic arteries and gastroduodenal artery. There also is tumor involvement at the level of the superior mesenteric vein. 3. 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   Electronically Signed   By: Irish Lack M.D.   On: 02/11/2013 10:34    Scheduled Meds: . bifidobacterium infantis  1 capsule Oral TID  . folic acid  2 mg Oral Daily  . lipase/protease/amylase  2 capsule Oral TID AC & HS  . metoprolol tartrate  12.5 mg Oral BID  . pantoprazole (PROTONIX) IV  40 mg Intravenous Q12H   Continuous Infusions: . sodium chloride 1,000 mL (02/12/13 0930)

## 2013-02-12 NOTE — Progress Notes (Signed)
  Radiation Oncology         ([phone]) [phone] ________________________________  Name: Austin Escobar MRN: [id number]  Date: 02/11/2013  DOB: 1956-01-09  Simulation Verification Note   NARRATIVE: The patient was brought to the treatment unit and placed in the planned treatment position. The clinical setup was verified. Then port films were obtained and uploaded to the radiation oncology medical record software.  The treatment beams were carefully compared against the planned radiation fields. The position, location, and shape of the radiation fields was reviewed. The targeted volume of tissue appears to be appropriately covered by the radiation beams. Based on my personal review, I approved the simulation verification. The patient's treatment will proceed as planned.  ________________________________   Radene Gunning, MD, PhD

## 2013-02-13 LAB — COMPREHENSIVE METABOLIC PANEL
ALT: 21 U/L (ref 0–53)
AST: 25 U/L (ref 0–37)
Albumin: 1.9 g/dL — ABNORMAL LOW (ref 3.5–5.2)
CO2: 19 mEq/L (ref 19–32)
Calcium: 8.2 mg/dL — ABNORMAL LOW (ref 8.4–10.5)
Creatinine, Ser: 0.88 mg/dL (ref 0.50–1.35)
Glucose, Bld: 123 mg/dL — ABNORMAL HIGH (ref 70–99)
Potassium: 3.7 mEq/L (ref 3.5–5.1)
Sodium: 131 mEq/L — ABNORMAL LOW (ref 135–145)

## 2013-02-13 LAB — CBC WITH DIFFERENTIAL/PLATELET
Eosinophils Relative: 1 % (ref 0–5)
HCT: 22.9 % — ABNORMAL LOW (ref 39.0–52.0)
Hemoglobin: 8 g/dL — ABNORMAL LOW (ref 13.0–17.0)
Lymphocytes Relative: 13 % (ref 12–46)
Lymphs Abs: 0.7 10*3/uL (ref 0.7–4.0)
MCH: 30 pg (ref 26.0–34.0)
MCV: 85.8 fL (ref 78.0–100.0)
Monocytes Absolute: 0.7 10*3/uL (ref 0.1–1.0)
Monocytes Relative: 12 % (ref 3–12)
Neutro Abs: 4.2 10*3/uL (ref 1.7–7.7)
Platelets: 49 10*3/uL — ABNORMAL LOW (ref 150–400)
RBC: 2.67 MIL/uL — ABNORMAL LOW (ref 4.22–5.81)
WBC: 5.6 10*3/uL (ref 4.0–10.5)

## 2013-02-13 LAB — PREPARE RBC (CROSSMATCH)

## 2013-02-13 LAB — CLOSTRIDIUM DIFFICILE BY PCR: Toxigenic C. Difficile by PCR: NEGATIVE

## 2013-02-13 MED ORDER — ACETAMINOPHEN 325 MG PO TABS
650.0000 mg | ORAL_TABLET | Freq: Once | ORAL | Status: AC
  Administered 2013-02-13: 650 mg via ORAL
  Filled 2013-02-13: qty 2

## 2013-02-13 MED ORDER — FUROSEMIDE 10 MG/ML IJ SOLN
10.0000 mg | Freq: Once | INTRAMUSCULAR | Status: AC
  Administered 2013-02-13: 10 mg via INTRAVENOUS
  Filled 2013-02-13: qty 2

## 2013-02-13 MED ORDER — DIPHENHYDRAMINE HCL 25 MG PO CAPS
25.0000 mg | ORAL_CAPSULE | Freq: Once | ORAL | Status: AC
  Administered 2013-02-13: 25 mg via ORAL
  Filled 2013-02-13: qty 1

## 2013-02-13 NOTE — Progress Notes (Signed)
Austin Escobar, unfortunately, had more GI bleeding last night. He has gotten to radiation treatments.  His platelet count was 49,000 today. Hemoglobin was 8. I will go ahead and give both blood and platelets. I think fresh platelets will help provide better hemostasis.  I would get his blood smear again. He does have a decreased platelet count but does have several large platelets. This would suggest a consumptive or destructive process. I saw no nucleated red cells.  He is a little worried about all this. I certainly understand this.  I told him that radiation can certainly take another 1-2 weeks before a "scars down" the cancer. If he bleeds today, may need to call radiation oncology to see if they can treat him over the weekend.  I did begin a discussion on end of life issues. He understands and is realistic about what is going on. He says he does not want to be kept alive on a ventilator knowing that he has a incurable malignancy. He was has wanted quality of life. As such, he is a NO CODE BLUE. I certainly understand this and respect this. I agree with his decision. I told him that we would still work hard to get him better so that he can go home. I told him that his decision not to be kept alive on machines would not affect any further treatment options that might be available. He understands this.  Is not hurting. There is no nausea vomiting. Yet she ate well yesterday.  His vital signs are okay. Blood pressure is 108/66. Abdomen is soft. Bowel sounds are okay. There's no obvious abdominal mass. Is no guarding or rebound tenderness. Lungs are clear. Cardiac exam regular rate and rhythm. Extremities shows some slight most likely in upper and lower extremities.  Again, we will transfuse with packed red blood cells and platelets. I still have to believe that his platelet count will come up. He was normal he came in. I just don't see any obvious medicine that he got that would cause the platelets to  decrease.  We will continue to follow along closely.  I spent a good half hour so with him today. Again, we had a long and honest discussion about his situation and prognosis.  [name]  Luke 1:37

## 2013-02-13 NOTE — Progress Notes (Signed)
Pt with 5 foul-smelling loose stools so far into shift. Reports having 10 stools yesterday. Placed on contact to r/o cdiff. Stool specimen collected and sent. Vwilliams,rn.

## 2013-02-13 NOTE — Progress Notes (Signed)
Contact isolation discontinued; pcr is negative for cdiff. Vwilliams,rn.

## 2013-02-13 NOTE — Progress Notes (Addendum)
[facility] PROGRESS NOTE  ORAL REMACHE OZH:[id number] DOB: 07/27/1955 DOA: 02/08/2013 PCP: [name], DO  Brief narrative: 57 y.o. male with past medical history significant for severe sigmoid diverticulosis, removal of multiple colon polyps by colonoscopy on 04/22/2012, metastatic pancreatic cancer, status post chemotherapy, history of saddle pulmonary embolism diagnosed 12/24/2012 and was on Lovenox outpatient, atrial fibrillation, hypertension who presented to [facility] ED 02/08/2013 with reports of dark and bloody bowel movement, several episodes which started few days prior to this admission.  In ED, patient's hemoglobin was 6.9 and he has initially received 3 units of PRBC. 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 Patient additionally received 3 units of PRBC. Pt cannot be on [facility] therapy due to risk of bleeding for which reason he underwent IVC filter placement. He also underwent palliative RT for pancreatic cancer. Hospital course is prolonged due to ongoing GI bleeding.  Assessment/Plan:   Principal problem:  Acute blood loss anemia  - Based on EGD performed 02/09/2013 likely duodenal bulb bleeding from tumor infiltration  - Patient has received a total of 6 units of PRBC since the admission; also received 1 dose of feraheme 02/12/2013 - hemoglobin dropped from 9.1 to 8 over past 24 hours so pt will receive another unit of PRBC today - underwent palliative RT 02/11/2013 and may need another treatment if bleeding persists - Continue Protonix 40 mg IV every 12 hours  - Appreciate oncology following  Active problems:  Thrombocytopenia - platelets dropped again from 54 to 49 and due to ongoing GI bleed plan per oncology to transfuse platelets as well as PRBC's Atrial fibrillation  - Repeated 12 lead EKG showed sinus tachycardia; HR  now WNL since we started metoprolol  HTN  - Continue metoprolol 12.5 mg BID Pancreatic cancer (metastatic)  - Patient is aware of his poor prognosis, and if still hoping to be accepted in a trial at [facility] by Dr. [name] (oncologist).  - had palliative RT 02/11/2013  - Continue pancreatic lipases supplementation  - appreciate oncology help with discussion of code status Pulmonary saddle embolus  - Due to risk of GI bleed, patient is off of anticoagulation for right now  - status post IVC filter placement 02/11/2013  Hypokalemia  - Repleted with potassium by mouth supplement   Code Status: DNR/DNI Family Communication: no family at the bedside  Disposition Plan: Remains inpatient, home once stable   Consultants:  Gastroenterology (Dr. Yancey Flemings)  Radiation oncology  Interventional radiology  Procedures:  EGD 02/09/2013 - 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 GI bleeding from the bulb second duodenum junction consistent with tumor infiltration.  Radiation therapy 02/11/2013  IVC filter placement 02/11/2013 Antibiotics:  None    Manson Passey, MD  [facility] Pager [phone]  If 7PM-7AM, please contact night-coverage [email or web address] Password [facility] 02/13/2013, 8:42 AM   LOS: 5 days    HPI/Subjective: Overnight GI bleeding, no other events.  Objective: Filed Vitals:   02/12/13 1440 02/12/13 1854 02/12/13 2133 02/13/13 0454  BP: 118/75 114/76 100/65 108/66  Pulse: 85 102 96 85  Temp: 98.1 F (36.7 C) 99.1 F (37.3 C) 98.8 F (37.1 C) 98.8 F (37.1 C)  TempSrc: Oral Oral Oral Oral  Resp: 18 22 18 18   Height:      Weight:      SpO2: 98% 95% 94% 100%    Intake/Output Summary (Last 24  hours) at 02/13/13 0842 Last data filed at 02/12/13 1900  Gross per 24 hour  Intake    915 ml  Output      0 ml  Net    915 ml    Exam:   General:  Pt is alert, follows commands appropriately, not in acute distress  Cardiovascular:  Regular rate and rhythm, S1/S2, no murmurs, no rubs, no gallops  Respiratory: Clear to auscultation bilaterally, no wheezing, no crackles, no rhonchi  Abdomen: Soft, non tender, non distended, bowel sounds present, no guarding  Extremities: No edema, pulses DP and PT palpable bilaterally  Neuro: Grossly nonfocal  Data Reviewed: Basic Metabolic Panel:  Recent Labs Lab 02/09/13 0445 02/10/13 0445 02/11/13 0625 02/12/13 0445 02/13/13 0500  NA 137 134* 133* 135 131*  K 3.9 2.9* 3.3* 3.3* 3.7  CL 107 104 105 105 104  CO2 22 21 22 21 19   GLUCOSE 117* 104* 115* 105* 123*  BUN 28* 19 14 13 12   CREATININE 0.98 0.80 0.93 0.88 0.88  CALCIUM 8.3* 7.7* 8.5 8.4 8.2*  MG 1.8 1.6 1.7 1.8 1.8   Liver Function Tests:  Recent Labs Lab 02/09/13 0445 02/10/13 0445 02/11/13 0625 02/12/13 0445 02/13/13 0500  AST 24 23 24 27 25   ALT 20 21 21 22 21   ALKPHOS 88 103 88 87 76  BILITOT 0.5 0.4 0.5 0.4 0.3  PROT 4.9* 4.9* 5.0* 5.1* 4.6*  ALBUMIN 2.4* 2.4* 2.3* 2.2* 1.9*    Recent Labs Lab 02/08/13 0935  LIPASE 28   No results found for this basename: AMMONIA,  in the last 168 hours CBC:  Recent Labs Lab 02/09/13 0445  02/10/13 0445  02/11/13 0625  02/11/13 1740 02/11/13 2210 02/12/13 0445 02/12/13 2005 02/13/13 0500  WBC 9.1  --  7.7  --  6.0  --   --   --  5.2  --  5.6  NEUTROABS 6.4  --  6.2  --  4.4  --   --   --  3.9  --  4.2  HGB 7.7*  < > 9.4*  < > 9.5*  < > 10.5* 9.4* 9.1* 9.1* 8.0*  HCT 22.5*  --  26.9*  --  27.9*  --   --   --  26.9* 26.2* 22.9*  MCV 87.5  --  84.6  --  85.8  --   --   --  86.2  --  85.8  PLT 70*  --  54*  --  48*  --   --   --  54*  --  49*  < > = values in this interval not displayed. Cardiac Enzymes:  Recent Labs Lab 02/08/13 1230  TROPONINI <0.30   BNP: No components found with this basename: POCBNP,  CBG: No results found for this basename: GLUCAP,  in the last 168 hours  Recent Results (from the past 240 hour(s))  MRSA PCR  SCREENING     Status: None   Collection Time    02/08/13 11:20 AM      Result Value Range Status   MRSA by PCR NEGATIVE  NEGATIVE Final   Comment:            The GeneXpert MRSA Assay (FDA     approved for NASAL specimens     only), is one component of a     comprehensive MRSA colonization     surveillance program. It is not     intended to diagnose MRSA  infection nor to guide or     monitor treatment for     MRSA infections.     Studies: Ir Ivc Filter Plmt / S&i /img Guid/mod Sed  02/11/2013   CLINICAL DATA:  Metastatic pancreatic carcinoma. History of pulmonary embolism. GI bleeding, a relative contraindication to anticoagulation.  EXAM: INFERIOR VENACAVOGRAM  IVC FILTER PLACEMENT UNDER FLUOROSCOPY  TECHNIQUE: The procedure, risks (including but not limited to bleeding, infection, organ damage ), benefits, and alternatives were explained to the patient. Questions regarding the procedure were encouraged and answered. The patient understands and consents to the procedure. Caval anatomy reviewed on previous CT abdomen. Patency of the right IJ vein was confirmed with ultrasound with image documentation. An appropriate skin site was determined. Skin site was marked, prepped with chlorhexidine, and draped using maximum barrier technique. The region was infiltrated locally with 1% lidocaine.  Intravenous Fentanyl and Versed were administered as conscious sedation during continuous cardiorespiratory monitoring by the radiology RN, with a total moderate sedation time of less than 30 minutes.  Under real-time ultrasound guidance, the right IJ vein was accessed with a 21 gauge micropuncture needle; the needle tip within the vein was confirmed with ultrasound image documentation. The needle was exchanged over a 018 guidewire for a transitional dilator, which allow advancement of the [facility] wire into the IVC. A long 6 French vascular sheath was placed for inferior venacavography. This demonstrated no  caval thrombus. Renal vein inflows were evident.  The [facility] IVC filter was advanced through the sheath and successfully deployed under fluoroscopy at the L2-3 disc level. Followup cavagram demonstrates stable filter position and no evident complication. The sheath was removed and hemostasis achieved at the site. No immediate complication.  FLUOROSCOPY TIME:  36 seconds  IMPRESSION: 1. Normal IVC. No thrombus or significant anatomic variation. 2. Technically successful infrarenal IVC filter placement. This is a retrievable model.   Electronically Signed   By: Oley Balm M.D.   On: 02/11/2013 13:43    Scheduled Meds: . acetaminophen  650 mg Oral Once  . bifidobacterium infantis  1 capsule Oral TID  . diphenhydrAMINE  25 mg Oral Once  . folic acid  2 mg Oral Daily  . furosemide  10 mg Intravenous Once  . lipase/protease/amylase  2 capsule Oral TID AC & HS  . metoprolol tartrate  12.5 mg Oral BID  . pantoprazole (PROTONIX) IV  40 mg Intravenous Q12H   Continuous Infusions: . sodium chloride 1,000 mL (02/12/13 0930)

## 2013-02-13 NOTE — Progress Notes (Signed)
Pt placed on contact precaution -enteric. Education provided. Austin Escobar

## 2013-02-14 DIAGNOSIS — E876 Hypokalemia: Secondary | ICD-10-CM

## 2013-02-14 LAB — COMPREHENSIVE METABOLIC PANEL
AST: 22 U/L (ref 0–37)
Albumin: 2.3 g/dL — ABNORMAL LOW (ref 3.5–5.2)
Alkaline Phosphatase: 85 U/L (ref 39–117)
BUN: 13 mg/dL (ref 6–23)
Chloride: 107 mEq/L (ref 96–112)
Creatinine, Ser: 0.9 mg/dL (ref 0.50–1.35)
GFR calc non Af Amer: >90 mL/min (ref >90)
Glucose, Bld: 124 mg/dL — ABNORMAL HIGH (ref 70–99)
Potassium: 3.4 mEq/L — ABNORMAL LOW (ref 3.5–5.1)
Total Bilirubin: 0.5 mg/dL (ref 0.3–1.2)

## 2013-02-14 LAB — CBC WITH DIFFERENTIAL/PLATELET
Basophils Absolute: 0 10*3/uL (ref 0.0–0.1)
Basophils Relative: 0 % (ref 0–1)
Eosinophils Absolute: 0.2 10*3/uL (ref 0.0–0.7)
Eosinophils Relative: 3 % (ref 0–5)
HCT: 26.3 % — ABNORMAL LOW (ref 39.0–52.0)
Lymphocytes Relative: 7 % — ABNORMAL LOW (ref 12–46)
MCHC: 34.2 g/dL (ref 30.0–36.0)
MCV: 86.2 fL (ref 78.0–100.0)
Monocytes Absolute: 1 10*3/uL (ref 0.1–1.0)
Monocytes Relative: 14 % — ABNORMAL HIGH (ref 3–12)
Neutro Abs: 5.1 10*3/uL (ref 1.7–7.7)
Platelets: 70 10*3/uL — ABNORMAL LOW (ref 150–400)
RBC: 3.05 MIL/uL — ABNORMAL LOW (ref 4.22–5.81)
RDW: 16.5 % — ABNORMAL HIGH (ref 11.5–15.5)

## 2013-02-14 LAB — PREPARE PLATELET PHERESIS: Unit division: 0

## 2013-02-14 LAB — MAGNESIUM: Magnesium: 1.9 mg/dL (ref 1.5–2.5)

## 2013-02-14 MED ORDER — FOLIC ACID 1 MG PO TABS: 2.0000 mg | ORAL_TABLET | Freq: Every day | ORAL | Status: DC

## 2013-02-14 MED ORDER — PANTOPRAZOLE SODIUM 40 MG PO TBEC: 40.0000 mg | DELAYED_RELEASE_TABLET | Freq: Two times a day (BID) | ORAL | Status: AC

## 2013-02-14 MED ORDER — POTASSIUM CHLORIDE CRYS ER 20 MEQ PO TBCR
40.0000 meq | EXTENDED_RELEASE_TABLET | Freq: Once | ORAL | Status: AC
  Administered 2013-02-14: 07:00:00 40 meq via ORAL
  Filled 2013-02-14: qty 2

## 2013-02-14 MED ORDER — PANCRELIPASE (LIP-PROT-AMYL) 12000-38000 UNITS PO CPEP: 2.0000 | ORAL_CAPSULE | Freq: Three times a day (TID) | ORAL | Status: AC

## 2013-02-14 MED ORDER — METOPROLOL TARTRATE 12.5 MG HALF TABLET: 12.5000 mg | ORAL_TABLET | Freq: Two times a day (BID) | ORAL | Status: DC

## 2013-02-14 NOTE — Progress Notes (Signed)
IN TO FLUSH PT'S PAC FOR DISCHARGE HOME, PT STATES HE CAN ONLY BE FLUSHED WITH SALINE, STATED HE CAN NOT HAVE ANY HEPARIN OR ANY FORM OF BLOOD THINNER DUE TO RISK FOR INTERNAL BLEEDING, NO HEPARIN INSTILLED, HPN REMOVED

## 2013-02-14 NOTE — Discharge Summary (Addendum)
Physician Discharge Summary  Austin Escobar UJW:[id number] DOB: Jan 07, 1956 DOA: 02/08/2013  PCP: [name], DO  Admit date: 02/08/2013 Discharge date: 02/14/2013  Recommendations for Outpatient Follow-up:  1. Please make sure you follow up with your oncologist to recheck your blood counts. Your hemoglobin was 9 prior to discharge and your platelet count was 70. This was after additional blood transfusion. Throughout the hospital stay you have received total of 6 units of blood and 1 unit of platelets 2. Potassium was repleted prior to discharge and please have your PCP recheck potassium level during next appointment.  3. Do not use Lovenox due to high risk of bleed. IVC filter was placed during this hospital stay.  4. Please come to ED if you should start to experience ongoing bleed, blood in stool or urine, if you feel lightheaded or dizzy.  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 male with past medical history significant for severe sigmoid diverticulosis, removal of multiple colon polyps by colonoscopy on 04/22/2012, metastatic pancreatic cancer, status post chemotherapy, history of saddle pulmonary embolism diagnosed 12/24/2012 and was on Lovenox outpatient, atrial fibrillation, hypertension who presented to [facility] ED 02/08/2013 with reports of dark and bloody bowel movement, several episodes which started few days prior to this admission.  In ED, patient's hemoglobin was 6.9 and he has initially received 3 units of PRBC. 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 Patient additionally received 2 units of PRBC. Pt cannot be on [facility] therapy due to risk of bleeding for which reason he underwent IVC filter placement. He also underwent palliative RT for pancreatic cancer. Hospital course is prolonged due to ongoing GI bleeding. He again received 1 unit of PRBC and 1 unit of platelet.  Assessment/Plan:   Principal problem:  Acute blood loss anemia  - Based on EGD performed 02/09/2013 likely duodenal bulb bleeding from tumor infiltration  - Patient has received a total of 6 units of PRBC since the admission; also received 1 dose of feraheme 02/12/2013  - hemoglobin dropped from 9.1 to 8 so pt got 1 more unit of PRBC and platelet 12/13 - underwent palliative RT 02/11/2013 and one RT is planned for Monday 02/15/13; pt aware of this  - Continue Protonix 40 mg  PO every 12 hours  - Appreciate oncology following while pt in hospital Active problems:  Thrombocytopenia  - platelets dropped again from 54 to 49 and due to ongoing GI bleed pt received 1 unit of platelet 02/13/13 - platelets 70 this am Atrial fibrillation  - Repeated 12 lead EKG showed sinus tachycardia; HR now WNL since we started metoprolol  HTN  - Continue metoprolol 12.5 mg BID  Pancreatic cancer (metastatic)  - Patient is aware of his poor prognosis, and if still hoping to be accepted in a trial at [facility] by Dr. [name] (oncologist).  - had palliative RT 02/11/2013  - Continue pancreatic lipases supplementation  - appreciate oncology help with  discussion of code status  Pulmonary saddle embolus  - Due to risk of GI bleed, patient is off of anticoagulation for right now  - status post IVC filter placement 02/11/2013  Hypokalemia  - Repleted with potassium by mouth prior to discharge today  Code Status: DNR/DNI  Family Communication: no family at the  bedside    Consultants:  Gastroenterology (Dr. Yancey Flemings)  Radiation oncology  Interventional radiology  Procedures:  EGD 02/09/2013 - 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 GI bleeding from the bulb second duodenum junction consistent with tumor infiltration.  Radiation therapy 02/11/2013  IVC filter placement 02/11/2013 Antibiotics:  None     Signed:  Manson Passey, MD  [facility] 02/14/2013, 11:43 AM  Pager #: [phone]   Discharge Exam: Filed Vitals:   02/14/13 0537  BP: 118/71  Pulse: 85  Temp: 98.1 F (36.7 C)  Resp: 18   Filed Vitals:   02/13/13 1915 02/13/13 2015 02/13/13 2103 02/14/13 0537  BP: 119/69 120/76 113/69 118/71  Pulse: 82 88 85 85  Temp: 98.2 F (36.8 C) 98.3 F (36.8 C) 98.6 F (37 C) 98.1 F (36.7 C)  TempSrc: Oral Oral Oral Oral  Resp: 16 18 18 18   Height:      Weight:      SpO2: 98% 98% 98% 98%    General: Pt is alert, follows commands appropriately, not in acute distress Cardiovascular: Regular rate and rhythm, S1/S2 +, no murmurs, no rubs, no gallops Respiratory: Clear to auscultation bilaterally, no wheezing, no crackles, no rhonchi Abdominal: Soft, non tender, non distended, bowel sounds +, no guarding Extremities: no edema, no cyanosis, pulses palpable bilaterally DP and PT Neuro: Grossly nonfocal  Discharge Instructions  Discharge Orders   Future Appointments Provider Department Dept Phone   02/15/2013 5:30 PM [facility] [phone]   02/16/2013 2:45 PM [facility] [phone]   02/17/2013 5:30 PM [facility] [phone]   02/18/2013 11:45 AM [facility] [phone]   02/19/2013 2:55 PM [facility] [place] [phone]    02/22/2013 2:45 PM [facility] [place] [facility] [phone]   02/23/2013 2:45 PM [facility] [place] [facility] [phone]   02/24/2013 2:45 PM [facility] [place] [facility] [phone]   Future Orders Complete By Expires   Call MD for:  difficulty breathing, headache or visual disturbances  As directed    Call MD for:  persistant dizziness or light-headedness  As directed    Call MD for:  persistant nausea and vomiting  As directed    Call MD for:  severe uncontrolled pain  As directed    Diet - low sodium heart healthy  As directed    Discharge instructions  As directed    Comments:     1. Please make sure you follow up with your oncologist to recheck your blood counts. Your hemoglobin was 9 prior to discharge and your platelet count was 70. This was after additional blood transfusion. Throughout the hospital stay you have received total of 6 units of blood and 1 unit of platelets 2. Potassium was repleted prior to discharge and please have your PCP recheck potassium level during next appointment.  3. Do not use Lovenox due to high risk of  bleed. IVC filter was placed during this hospital stay.  4. Please come to ED if you should start to experience ongoing bleed, blood in stool or urine, if you feel lightheaded or dizzy.   Increase activity slowly  As directed        Medication List    STOP taking these medications       enoxaparin 120 MG/0.8ML injection  Commonly known as:  LOVENOX      TAKE these medications       bifidobacterium infantis capsule  Take 3 capsules by mouth daily.     diphenoxylate-atropine 2.5-0.025 MG per tablet  Commonly known as:  LOMOTIL  Take 1 tablet by mouth as needed for diarrhea or loose stools.     folic acid 1 MG tablet  Commonly known as:  FOLVITE  Take 2 tablets (2 mg total) by mouth daily.     HYALURONIC ACID PO  Take 3 tablets by  mouth daily.     hyoscyamine 0.125 MG tablet  Commonly known as:  LEVSIN, ANASPAZ  Take 1 tablet (0.125 mg total) by mouth every 6 (six) hours as needed for cramping.     lipase/protease/amylase 16109 UNITS Cpep capsule  Commonly known as:  CREON-12/PANCREASE  Take 2 capsules by mouth 4 (four) times daily -  before meals and at bedtime.     LORazepam 1 MG tablet  Commonly known as:  ATIVAN  Take 1 mg by mouth every 6 (six) hours as needed (sleep).     metoprolol tartrate 12.5 mg Tabs tablet  Commonly known as:  LOPRESSOR  Take 0.5 tablets (12.5 mg total) by mouth 2 (two) times daily.     oxyCODONE 100 MG/5ML concentrated solution  Commonly known as:  ROXICODONE INTENSOL  Take 0.3-0.6 mLs (6-12 mg total) by mouth every 4 (four) hours as needed for severe pain.     pantoprazole 40 MG tablet  Commonly known as:  PROTONIX  Take 1 tablet (40 mg total) by mouth 2 (two) times daily.           Follow-up Information   Follow up with [name], DO. Schedule an appointment as soon as possible for a visit in 1 week. (recheck CBC and BMP)    Specialty:  Internal Medicine   Contact information:   [address] [place] [address] [phone]        The results of significant diagnostics from this hospitalization (including imaging, microbiology, ancillary and laboratory) are listed below for reference.    Significant Diagnostic Studies: Ir Ivc Filter Plmt / S&i /img Guid/mod Sed  02/11/2013   CLINICAL DATA:  Metastatic pancreatic carcinoma. History of pulmonary embolism. GI bleeding, a relative contraindication to anticoagulation.  EXAM: INFERIOR VENACAVOGRAM  IVC FILTER PLACEMENT UNDER FLUOROSCOPY  TECHNIQUE: The procedure, risks (including but not limited to bleeding, infection, organ damage ), benefits, and alternatives were explained to the patient. Questions regarding the procedure were encouraged and answered. The patient understands and consents to the procedure.  Caval anatomy reviewed on previous CT abdomen. Patency of the right IJ vein was confirmed with ultrasound with image documentation. An appropriate skin site was determined. Skin site was marked, prepped with chlorhexidine, and draped using maximum barrier technique. The region was infiltrated locally with 1% lidocaine.  Intravenous Fentanyl and Versed were administered as conscious sedation during continuous cardiorespiratory monitoring by the radiology RN, with a total moderate sedation time of less than 30 minutes.  Under real-time ultrasound guidance, the right  IJ vein was accessed with a 21 gauge micropuncture needle; the needle tip within the vein was confirmed with ultrasound image documentation. The needle was exchanged over a 018 guidewire for a transitional dilator, which allow advancement of the [facility] wire into the IVC. A long 6 French vascular sheath was placed for inferior venacavography. This demonstrated no caval thrombus. Renal vein inflows were evident.  The [facility] IVC filter was advanced through the sheath and successfully deployed under fluoroscopy at the L2-3 disc level. Followup cavagram demonstrates stable filter position and no evident complication. The sheath was removed and hemostasis achieved at the site. No immediate complication.  FLUOROSCOPY TIME:  36 seconds  IMPRESSION: 1. Normal IVC. No thrombus or significant anatomic variation. 2. Technically successful infrarenal IVC filter placement. This is a retrievable model.   Electronically Signed   By: Oley Balm M.D.   On: 02/11/2013 13:43   Ct Angio Abd/pel W/ And/or W/o  02/11/2013   CLINICAL DATA:  Metastatic pancreatic carcinoma with GI bleed due to duodenal invasion. CT angiography is performed to assess vasculature prior to potential arteriography and transcatheter embolization to treat persistent bleeding.  EXAM: CT ANGIOGRAPHY ABDOMEN AND PELVIS WITH CONTRAST AND WITHOUT CONTRAST  TECHNIQUE: Multidetector CT imaging of the  abdomen and pelvis was performed using the standard protocol during bolus administration of intravenous contrast. Multiplanar reconstructed images including MIPs were obtained and reviewed to evaluate the vascular anatomy.  CONTRAST:  OMNIPAQUE IOHEXOL 350 MG/ML SOLN  COMPARISON:  12/23/2012  FINDINGS: There is significant enlargement of the mass involving the head of the pancreas since the prior CT in October. Dimensions of the mass are now approximately 3.7 x 5.6 x 4.5 cm. The mass visibly encases the pre-existing biliary stent and also extends further to encase the gastroduodenal artery, portal venous confluence, hepatic artery and a segment of the superior mesenteric vein. 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 Portal flow is present in the right lobe.  Significant progression of metastatic disease in the liver is also identified in both left and right lobes. Index lesion in the inferior right lobe shows enlargement with dimensions of 2.5 x 3.5 cm (1.6 x 2.1 cm previously). The largest left lobe lesion has enlarged from a maximum diameter of 2.4 cm previously to estimated current maximum diameter of 4.0 cm. All previously identified metastatic lesions have increased significantly in size. There are at least 4 new metastatic lesions identified in the liver. There is no evidence of associated biliary obstruction with air remaining a biliary tree. The common bile duct stent lumen does not appear to be compromised.  There is new ascites with mild to moderate overall volume of ascites present as well as nodularity of the mesenteric and omentum suspicious for carcinomatosis. No bowel obstruction or perforation is identified.  Arterial vasculature evaluated by CTA shows no evidence of significant occlusive disease. The aorta and major visceral branches  are normally patent. At the level of the celiac trunk, the common hepatic artery is narrowed and encased by tumor but remains open with flow noted in both right and left hepatic arteries. The gastroduodenal artery is open but encased by tumor. Small distal GDA branches are present supplying the pancreatic mass. There also appears to be pancreaticoduodenal supply from the SMA at the level of the pancreatic mass.  The superior mesenteric artery abuts  tumor but is not visibly narrowed. Focal nonocclusive thrombus or direct tumor invasion is identified in the superior aspect of the superior mesenteric vein causing luminal stenosis of approximately 60%. The splenic vein remains open.  Bilateral renal artery show normal patency. The inferior mesenteric vein is open. Bilateral iliac and common femoral arteries show normal patency.  Appearance of the gallbladder and kidneys are unremarkable. The spleen shows a new focal wedge-shaped infarct in its posterior and inferior aspect. The rest of the spleen shows normal perfusion. There is a stable left adrenal nodule. Bony structures show degenerative changes of the spine without evidence of focal lesions.  Review of the MIP images confirms the above findings.  IMPRESSION: 1. Significant progression of metastatic pancreatic carcinoma with enlargement of the mass at the level of the head of the pancreas and progression of hepatic metastatic disease. 2. 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 Tumor also encases and narrows the common hepatic artery, right and left hepatic arteries and gastroduodenal artery. There also is tumor involvement at the level of the superior mesenteric vein. 3. 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   Electronically Signed   By: Irish Lack M.D.   On: 02/11/2013 10:34    Microbiology: MRSA PCR SCREENING     Status:  None   Collection Time    02/08/13 11:20 AM      Result Value Range Status   MRSA by PCR NEGATIVE  NEGATIVE Final  CLOSTRIDIUM DIFFICILE BY PCR     Status: None   Collection Time    02/13/13  1:10 PM      Result Value Range Status   C difficile by pcr NEGATIVE  NEGATIVE Final   Comment: Performed at [facility]     Labs: Basic Metabolic Panel:  Recent Labs Lab 02/10/13 0445 02/11/13 0625 02/12/13 0445 02/13/13 0500 02/14/13 0500  NA 134* 133* 135 131* 135  K 2.9* 3.3* 3.3* 3.7 3.4*  CL 104 105 105 104 107  CO2 21 22 21 19 21   GLUCOSE 104* 115* 105* 123* 124*  BUN 19 14 13 12 13   CREATININE 0.80 0.93 0.88 0.88 0.90  CALCIUM 7.7* 8.5 8.4 8.2* 8.4  MG 1.6 1.7 1.8 1.8 1.9   Liver Function Tests:  Recent Labs Lab 02/10/13 0445 02/11/13 0625 02/12/13 0445 02/13/13 0500 02/14/13 0500  AST 23 24 27 25 22   ALT 21 21 22 21 22   ALKPHOS 103 88 87 76 85  BILITOT 0.4 0.5 0.4 0.3 0.5  PROT 4.9* 5.0* 5.1* 4.6* 5.1*  ALBUMIN 2.4* 2.3* 2.2* 1.9* 2.3*    Recent Labs Lab 02/08/13 0935  LIPASE 28   No results found for this basename: AMMONIA,  in the last 168 hours CBC:  Recent Labs Lab 02/10/13 0445  02/11/13 0625  02/11/13 2210 02/12/13 0445 02/12/13 2005 02/13/13 0500 02/14/13 0500  WBC 7.7  --  6.0  --   --  5.2  --  5.6 6.8  NEUTROABS 6.2  --  4.4  --   --  3.9  --  4.2 5.1  HGB 9.4*  < > 9.5*  < > 9.4* 9.1* 9.1* 8.0* 9.0*  HCT 26.9*  --  27.9*  --   --  26.9* 26.2* 22.9* 26.3*  MCV 84.6  --  85.8  --   --  86.2  --  85.8 86.2  PLT 54*  --  48*  --   --  54*  --  49* 70*  < > = values in this interval not displayed. Cardiac Enzymes:  Recent Labs Lab 02/08/13 1230  TROPONINI <0.30   BNP: BNP (last 3 results)  Recent Labs  02/08/13 1230  PROBNP 54.4   CBG: No results found for this basename: GLUCAP,  in the last 168 hours  Time coordinating discharge: Over 30 minutes

## 2013-02-15 ENCOUNTER — Encounter (HOSPITAL_COMMUNITY): Payer: Self-pay | Admitting: Emergency Medicine

## 2013-02-15 ENCOUNTER — Inpatient Hospital Stay (HOSPITAL_COMMUNITY)
Admission: EM | Admit: 2013-02-15 | Discharge: 2013-02-27 | Disposition: A | Discharge status: Hospice | Payer: BC Managed Care – PPO | Source: Home / Self Care | Attending: Internal Medicine | Admitting: Internal Medicine

## 2013-02-15 ENCOUNTER — Ambulatory Visit: Payer: BC Managed Care – PPO

## 2013-02-15 ENCOUNTER — Telehealth: Payer: Self-pay | Admitting: *Deleted

## 2013-02-15 DIAGNOSIS — Z9221 Personal history of antineoplastic chemotherapy: Secondary | ICD-10-CM

## 2013-02-15 DIAGNOSIS — Z823 Family history of stroke: Secondary | ICD-10-CM

## 2013-02-15 DIAGNOSIS — K831 Obstruction of bile duct: Secondary | ICD-10-CM

## 2013-02-15 DIAGNOSIS — Z8547 Personal history of malignant neoplasm of testis: Secondary | ICD-10-CM

## 2013-02-15 DIAGNOSIS — Z79899 Other long term (current) drug therapy: Secondary | ICD-10-CM

## 2013-02-15 DIAGNOSIS — I2692 Saddle embolus of pulmonary artery without acute cor pulmonale: Secondary | ICD-10-CM | POA: Diagnosis present

## 2013-02-15 DIAGNOSIS — I6350 Cerebral infarction due to unspecified occlusion or stenosis of unspecified cerebral artery: Secondary | ICD-10-CM | POA: Diagnosis not present

## 2013-02-15 DIAGNOSIS — R739 Hyperglycemia, unspecified: Secondary | ICD-10-CM

## 2013-02-15 DIAGNOSIS — E876 Hypokalemia: Secondary | ICD-10-CM

## 2013-02-15 DIAGNOSIS — I4891 Unspecified atrial fibrillation: Secondary | ICD-10-CM

## 2013-02-15 DIAGNOSIS — G464 Cerebellar stroke syndrome: Secondary | ICD-10-CM | POA: Diagnosis present

## 2013-02-15 DIAGNOSIS — D62 Acute posthemorrhagic anemia: Secondary | ICD-10-CM | POA: Diagnosis not present

## 2013-02-15 DIAGNOSIS — Z860100 Personal history of colon polyps, unspecified: Secondary | ICD-10-CM

## 2013-02-15 DIAGNOSIS — I10 Essential (primary) hypertension: Secondary | ICD-10-CM | POA: Diagnosis present

## 2013-02-15 DIAGNOSIS — N529 Male erectile dysfunction, unspecified: Secondary | ICD-10-CM

## 2013-02-15 DIAGNOSIS — K319 Disease of stomach and duodenum, unspecified: Secondary | ICD-10-CM

## 2013-02-15 DIAGNOSIS — D689 Coagulation defect, unspecified: Secondary | ICD-10-CM | POA: Diagnosis present

## 2013-02-15 DIAGNOSIS — I81 Portal vein thrombosis: Secondary | ICD-10-CM | POA: Diagnosis present

## 2013-02-15 DIAGNOSIS — C787 Secondary malignant neoplasm of liver and intrahepatic bile duct: Secondary | ICD-10-CM | POA: Diagnosis present

## 2013-02-15 DIAGNOSIS — D696 Thrombocytopenia, unspecified: Secondary | ICD-10-CM

## 2013-02-15 DIAGNOSIS — D6869 Other thrombophilia: Secondary | ICD-10-CM | POA: Diagnosis present

## 2013-02-15 DIAGNOSIS — J45909 Unspecified asthma, uncomplicated: Secondary | ICD-10-CM

## 2013-02-15 DIAGNOSIS — R1032 Left lower quadrant pain: Secondary | ICD-10-CM

## 2013-02-15 DIAGNOSIS — I8500 Esophageal varices without bleeding: Secondary | ICD-10-CM | POA: Diagnosis present

## 2013-02-15 DIAGNOSIS — C259 Malignant neoplasm of pancreas, unspecified: Secondary | ICD-10-CM

## 2013-02-15 DIAGNOSIS — R188 Other ascites: Secondary | ICD-10-CM | POA: Diagnosis present

## 2013-02-15 DIAGNOSIS — K5730 Diverticulosis of large intestine without perforation or abscess without bleeding: Secondary | ICD-10-CM | POA: Diagnosis present

## 2013-02-15 DIAGNOSIS — Z791 Long term (current) use of non-steroidal anti-inflammatories (NSAID): Secondary | ICD-10-CM

## 2013-02-15 DIAGNOSIS — C250 Malignant neoplasm of head of pancreas: Secondary | ICD-10-CM

## 2013-02-15 DIAGNOSIS — K3189 Other diseases of stomach and duodenum: Secondary | ICD-10-CM

## 2013-02-15 DIAGNOSIS — D751 Secondary polycythemia: Secondary | ICD-10-CM

## 2013-02-15 DIAGNOSIS — D649 Anemia, unspecified: Secondary | ICD-10-CM

## 2013-02-15 DIAGNOSIS — M545 Low back pain: Secondary | ICD-10-CM

## 2013-02-15 DIAGNOSIS — K922 Gastrointestinal hemorrhage, unspecified: Secondary | ICD-10-CM

## 2013-02-15 DIAGNOSIS — L02219 Cutaneous abscess of trunk, unspecified: Secondary | ICD-10-CM

## 2013-02-15 DIAGNOSIS — E871 Hypo-osmolality and hyponatremia: Secondary | ICD-10-CM

## 2013-02-15 LAB — CBC
Hemoglobin: 7.3 g/dL — ABNORMAL LOW (ref 13.0–17.0)
MCHC: 35.4 g/dL (ref 30.0–36.0)
MCV: 86.9 fL (ref 78.0–100.0)
Platelets: 85 10*3/uL — ABNORMAL LOW (ref 150–400)
RDW: 16.6 % — ABNORMAL HIGH (ref 11.5–15.5)

## 2013-02-15 LAB — TYPE AND SCREEN

## 2013-02-15 LAB — PREPARE RBC (CROSSMATCH)

## 2013-02-15 LAB — COMPREHENSIVE METABOLIC PANEL
ALT: 21 U/L (ref 0–53)
AST: 25 U/L (ref 0–37)
BUN: 14 mg/dL (ref 6–23)
CO2: 18 mEq/L — ABNORMAL LOW (ref 19–32)
Calcium: 8.1 mg/dL — ABNORMAL LOW (ref 8.4–10.5)
GFR calc Af Amer: >90 mL/min (ref >90)
GFR calc non Af Amer: >90 mL/min (ref >90)
Glucose, Bld: 135 mg/dL — ABNORMAL HIGH (ref 70–99)
Sodium: 133 mEq/L — ABNORMAL LOW (ref 135–145)
Total Bilirubin: 0.4 mg/dL (ref 0.3–1.2)
Total Protein: 5 g/dL — ABNORMAL LOW (ref 6.0–8.3)

## 2013-02-15 LAB — OCCULT BLOOD, POC DEVICE: Fecal Occult Bld: POSITIVE — AB

## 2013-02-15 MED ORDER — MORPHINE SULFATE 2 MG/ML IJ SOLN
1.0000 mg | INTRAMUSCULAR | Status: DC | PRN
  Administered 2013-02-16: 1 mg via INTRAVENOUS
  Filled 2013-02-15: qty 1

## 2013-02-15 MED ORDER — LORAZEPAM 1 MG PO TABS
1.0000 mg | ORAL_TABLET | Freq: Four times a day (QID) | ORAL | Status: DC | PRN
  Administered 2013-02-18: 1 mg via ORAL
  Filled 2013-02-15: qty 1

## 2013-02-15 MED ORDER — PANTOPRAZOLE SODIUM 40 MG IV SOLR
40.0000 mg | INTRAVENOUS | Status: DC
  Administered 2013-02-15 – 2013-02-19 (×4): 40 mg via INTRAVENOUS
  Filled 2013-02-15 (×6): qty 40

## 2013-02-15 MED ORDER — SODIUM CHLORIDE 0.9 % IV SOLN
INTRAVENOUS | Status: DC
  Administered 2013-02-15 – 2013-02-16 (×3): via INTRAVENOUS

## 2013-02-15 MED ORDER — SODIUM CHLORIDE 0.9 % IV BOLUS (SEPSIS)
1000.0000 mL | Freq: Once | INTRAVENOUS | Status: AC
  Administered 2013-02-15: 1000 mL via INTRAVENOUS

## 2013-02-15 MED ORDER — PANCRELIPASE (LIP-PROT-AMYL) 12000-38000 UNITS PO CPEP
2.0000 | ORAL_CAPSULE | Freq: Three times a day (TID) | ORAL | Status: DC
Start: 2013-02-15 — End: 2013-02-27
  Administered 2013-02-15 – 2013-02-19 (×2): 2 via ORAL
  Administered 2013-02-19: 1 via ORAL
  Administered 2013-02-20 – 2013-02-23 (×9): 2 via ORAL
  Administered 2013-02-23: 1 via ORAL
  Administered 2013-02-24 – 2013-02-26 (×9): 2 via ORAL
  Filled 2013-02-15 (×53): qty 2

## 2013-02-15 MED ORDER — FOLIC ACID 1 MG PO TABS
2.0000 mg | ORAL_TABLET | Freq: Every day | ORAL | Status: DC
  Administered 2013-02-15 – 2013-02-25 (×8): 2 mg via ORAL
  Filled 2013-02-15 (×12): qty 2

## 2013-02-15 MED ORDER — SODIUM CHLORIDE 0.9 % IV SOLN: INTRAVENOUS | Status: AC

## 2013-02-15 MED ORDER — SODIUM CHLORIDE 0.9 % IJ SOLN
3.0000 mL | Freq: Two times a day (BID) | INTRAMUSCULAR | Status: DC
  Administered 2013-02-15 – 2013-02-26 (×11): 3 mL via INTRAVENOUS

## 2013-02-15 NOTE — Progress Notes (Signed)
02/15/13 1400  Clinical Encounter Type  Visited With Family (SO Guerry Bruin)  Visit Type Spiritual support;Social support  Referral From Physician;Family (per SO, she accepted physician's offer of chaplain visit)  Spiritual Encounters  Spiritual Needs Emotional;Prayer  Stress Factors  Patient Stress Factors Health changes;Loss of control;Major life changes  Family Stress Factors Loss of control;Major life changes   Provided spiritual and emotional support to SO [employer] as she began to work through the Advice worker and heartache associated with Hitesh's readmission.  She welcomed pastoral presence and prayer for courage, comfort, and awareness of God's presence in the midst of this struggle.  The theological understanding that God is already in the future, making the way, was very meaningful to her.  She is aware of ongoing chaplain availability for whole family.  Plan to follow up with Theron Arista this afternoon, but please also page as needed for emotional support:  [phone].  Thank you.  [address] [place], [place] [phone]

## 2013-02-15 NOTE — Telephone Encounter (Signed)
Returned phone message to Amy,RN  From SDr.Ennever;s office, that letting us know patient back in the hospital ED for recurrent GI bled, I called Dr.moody,will hold radiatin treatment today and resume tomorrow per MD,spoke with Jennifer,RT and informe her of pateint status,thanked Amy,RN for the call 1:16 PM

## 2013-02-15 NOTE — ED Notes (Signed)
18G LAC inserted per [facility] Mahala Menghini, MD). GI consult finishing up now. Report called to [place] in [place]. Admission nurse in room. Pt to transport shortly.
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
## 2013-02-15 NOTE — Progress Notes (Signed)
Patient ID: JONTA GASTINEAU, male   DOB: 1955-04-20, 57 y.o.   MRN: [id number] Request received for re evaluation of pt for possible mesenteric arteriogram /embolization secondary to recurrent GI bleed from pancreatic carcinoma invasion of duodenal bulb. Pt originally seen in consultation by our service on 02/11/13 (see note and Dr. Antonietta Jewel assessment). Pt was d/c'd home on 12/14 but due to recurrence of GI bleeding he was readmitted today. Pt is s/p IVC filter placement on 12/11 due to hx PE. Imaging studies were reviewed again by Drs. Henn/Yamagata and case d/w Dr. [name]. At this time plan is for placement of a duodenal stent by Dr. [name] on 12/16 to possibly assist with tamponade of bleeding. If bleeding persists we can pursue arteriography with possible embolization. Pt currently being transfused (hgb 7.3, plts 85k), creat 0.89. We will cont to monitor. Above d/w pt/wife.

## 2013-02-15 NOTE — Progress Notes (Signed)
UR completed 
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
## 2013-02-15 NOTE — ED Notes (Signed)
Pt was hospitalized last week for same symptoms. Pt began having rectal bleed at midnight

## 2013-02-15 NOTE — Progress Notes (Signed)
02/15/13 1600  Clinical Encounter Type  Visited With Family;Patient not available (brother's wife Austin Escobar (pt resting))  Visit Type Follow-up   Austin Escobar was resting, and then had two physicians visit, while I followed up with his brother's wife Austin Escobar.  Provided pastoral presence and intro to Spiritual Care, and let her know of ongoing chaplain availability for spiritual and emotional support to whole family, individually/in groups, as would be helpful and meaningful.  Please page as needed:  [phone].  Thank you.  [address] [place], [place] [phone]
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
## 2013-02-16 ENCOUNTER — Inpatient Hospital Stay (HOSPITAL_COMMUNITY): Payer: BC Managed Care – PPO | Admitting: Anesthesiology

## 2013-02-16 ENCOUNTER — Ambulatory Visit: Payer: BC Managed Care – PPO

## 2013-02-16 ENCOUNTER — Encounter (HOSPITAL_COMMUNITY)
Admission: EM | Disposition: A | Discharge status: Hospice | Payer: Self-pay | Source: Home / Self Care | Attending: Internal Medicine

## 2013-02-16 ENCOUNTER — Ambulatory Visit
Admit: 2013-02-16 | Discharge: 2013-02-16 | Disposition: A | Discharge status: Home / Self Care | Payer: BC Managed Care – PPO | Attending: Radiation Oncology | Admitting: Radiation Oncology

## 2013-02-16 ENCOUNTER — Telehealth: Payer: Self-pay | Admitting: *Deleted

## 2013-02-16 ENCOUNTER — Inpatient Hospital Stay (HOSPITAL_COMMUNITY): Payer: BC Managed Care – PPO

## 2013-02-16 ENCOUNTER — Encounter (HOSPITAL_COMMUNITY): Payer: Self-pay | Admitting: *Deleted

## 2013-02-16 ENCOUNTER — Encounter (HOSPITAL_COMMUNITY): Payer: BC Managed Care – PPO | Admitting: Anesthesiology

## 2013-02-16 DIAGNOSIS — C787 Secondary malignant neoplasm of liver and intrahepatic bile duct: Secondary | ICD-10-CM

## 2013-02-16 DIAGNOSIS — D759 Disease of blood and blood-forming organs, unspecified: Secondary | ICD-10-CM

## 2013-02-16 DIAGNOSIS — R42 Dizziness and giddiness: Secondary | ICD-10-CM

## 2013-02-16 DIAGNOSIS — D696 Thrombocytopenia, unspecified: Secondary | ICD-10-CM

## 2013-02-16 DIAGNOSIS — I2692 Saddle embolus of pulmonary artery without acute cor pulmonale: Secondary | ICD-10-CM

## 2013-02-16 HISTORY — PX: DUODENAL STENT PLACEMENT: SHX5541

## 2013-02-16 HISTORY — PX: ESOPHAGOGASTRODUODENOSCOPY: SHX5428

## 2013-02-16 LAB — CBC
HCT: 22.7 % — ABNORMAL LOW (ref 39.0–52.0)
Hemoglobin: 7.7 g/dL — ABNORMAL LOW (ref 13.0–17.0)
Hemoglobin: 7.8 g/dL — ABNORMAL LOW (ref 13.0–17.0)
Hemoglobin: 7.9 g/dL — ABNORMAL LOW (ref 13.0–17.0)
MCH: 30.1 pg (ref 26.0–34.0)
MCH: 29.7 pg (ref 26.0–34.0)
MCH: 30.4 pg (ref 26.0–34.0)
MCHC: 35 g/dL (ref 30.0–36.0)
MCHC: 35 g/dL (ref 30.0–36.0)
MCV: 86.5 fL (ref 78.0–100.0)
MCV: 86.8 fL (ref 78.0–100.0)
MCV: 86.6 fL (ref 78.0–100.0)
Platelets: 87 10*3/uL — ABNORMAL LOW (ref 150–400)
RBC: 2.59 MIL/uL — ABNORMAL LOW (ref 4.22–5.81)
RBC: 2.59 MIL/uL — ABNORMAL LOW (ref 4.22–5.81)
RBC: 2.57 MIL/uL — ABNORMAL LOW (ref 4.22–5.81)
RDW: 16.5 % — ABNORMAL HIGH (ref 11.5–15.5)
RDW: 17.2 % — ABNORMAL HIGH (ref 11.5–15.5)
RDW: 17.1 % — ABNORMAL HIGH (ref 11.5–15.5)
WBC: 8.4 10*3/uL (ref 4.0–10.5)
WBC: 8 10*3/uL (ref 4.0–10.5)
WBC: 9.2 10*3/uL (ref 4.0–10.5)

## 2013-02-16 LAB — PROTIME-INR: Prothrombin Time: 20.2 seconds — ABNORMAL HIGH (ref 11.6–15.2)

## 2013-02-16 SURGERY — EGD (ESOPHAGOGASTRODUODENOSCOPY)
Anesthesia: Monitor Anesthesia Care

## 2013-02-16 SURGERY — ESOPHAGOGASTRODUODENOSCOPY (EGD) WITH PROPOFOL
Anesthesia: Monitor Anesthesia Care

## 2013-02-16 MED ORDER — GLYCOPYRROLATE 0.2 MG/ML IJ SOLN
INTRAMUSCULAR | Status: AC
  Filled 2013-02-16: qty 1

## 2013-02-16 MED ORDER — MIDAZOLAM HCL 2 MG/2ML IJ SOLN
INTRAMUSCULAR | Status: AC
  Filled 2013-02-16: qty 2

## 2013-02-16 MED ORDER — MORPHINE SULFATE 2 MG/ML IJ SOLN
INTRAMUSCULAR | Status: AC
  Filled 2013-02-16: qty 1

## 2013-02-16 MED ORDER — MIDAZOLAM HCL 5 MG/5ML IJ SOLN
INTRAMUSCULAR | Status: DC | PRN
  Administered 2013-02-16 (×2): 1 mg via INTRAVENOUS

## 2013-02-16 MED ORDER — KETAMINE HCL 50 MG/ML IJ SOLN
INTRAMUSCULAR | Status: DC | PRN
  Administered 2013-02-16: 25 mg via INTRAMUSCULAR

## 2013-02-16 MED ORDER — SUCRALFATE 1 GM/10ML PO SUSP
2.0000 g | Freq: Four times a day (QID) | ORAL | Status: DC
  Administered 2013-02-16 – 2013-02-21 (×12): 2 g via ORAL
  Filled 2013-02-16 (×27): qty 20

## 2013-02-16 MED ORDER — GLYCOPYRROLATE 0.2 MG/ML IJ SOLN
INTRAMUSCULAR | Status: DC | PRN
  Administered 2013-02-16: 0.2 mg via INTRAVENOUS

## 2013-02-16 MED ORDER — FUROSEMIDE 10 MG/ML IJ SOLN
20.0000 mg | Freq: Once | INTRAMUSCULAR | Status: AC
  Administered 2013-02-16: 20 mg via INTRAVENOUS
  Filled 2013-02-16: qty 2

## 2013-02-16 MED ORDER — PROPOFOL 10 MG/ML IV BOLUS
INTRAVENOUS | Status: AC
  Filled 2013-02-16: qty 20

## 2013-02-16 MED ORDER — FENTANYL CITRATE 0.05 MG/ML IJ SOLN
INTRAMUSCULAR | Status: AC
  Filled 2013-02-16: qty 2

## 2013-02-16 MED ORDER — PROPOFOL INFUSION 10 MG/ML OPTIME
INTRAVENOUS | Status: DC | PRN
  Administered 2013-02-16: 50 ug/kg/min via INTRAVENOUS

## 2013-02-16 MED ORDER — MORPHINE SULFATE 2 MG/ML IJ SOLN: 2.0000 mg | INTRAMUSCULAR | Status: DC | PRN

## 2013-02-16 MED ORDER — FENTANYL CITRATE 0.05 MG/ML IJ SOLN
50.0000 ug | INTRAMUSCULAR | Status: DC | PRN
  Administered 2013-02-16: 50 ug via INTRAVENOUS
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
## 2013-02-16 NOTE — Progress Notes (Signed)
CARE MANAGEMENT NOTE 02/16/2013  Patient:  JOBANNY, MAVIS   Account Number:  [id number]  Date Initiated:  02/16/2013  Documentation initiated by:  DAVIS,RHONDA  Subjective/Objective Assessment:   pt readmitted due to recurrent varicles bleed/went to or for stent placement -attempt failed hgb 7.7 bld products being given.     Action/Plan:   home when stable   Anticipated DC Date:  02/19/2013   Anticipated DC Plan:  HOME/SELF CARE  In-house referral  NA      DC Planning Services  NA      [facility] Choice  NA   Choice offered to / List presented to:  NA   DME arranged  NA      DME agency  NA     HH arranged  NA      HH agency  NA   Status of service:  In process, will continue to follow Medicare Important Message given?  NA - LOS <3 / Initial given by admissions (If response is "NO", the following Medicare IM given date fields will be blank) Date Medicare IM given:   Date Additional Medicare IM given:    Discharge Disposition:    Per UR Regulation:  Reviewed for med. necessity/level of care/duration of stay  If discussed at Long Length of Stay Meetings, dates discussed:    Comments:  [id number]/Rhonda Stark Jock, BSN, [place] [phone] Chart Reviewed for discharge and hospital needs. Discharge needs at time of review:  None present will follow for needs. Review of patient progress due on [id number].
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
## 2013-02-16 NOTE — Progress Notes (Signed)
Austin Escobar ZOX:[id number] DOB: 06-16-1955 DOA: 02/15/2013 PCP: Austin Lemons, DO  Brief narrative: Austin Escobar is a 57 y.o. male known history testicular CA/ seminoma S/p. 2800 RAD 2003, severe sigmoid diverticulosis, multiple polyps 04/22/12 Austin Escobar), secondary polycythemia, prior atrial fibrillation status post ablation 3, metastatic pancreatic 06/01/2012 = 2.6 X2 0.8 centimeter mass = S./B. 3 cycles folfirinox+ liver metastases-ECoG 1, last CA 19-9 = 88,431, saddle embolism 10/22 previously on Lovenox-recent admit 12/8-12/14 acute GI bleed, found to have duodenal infiltrationwith pancreatic tumor- ultimately CTA 12/8 = tumor increasing multiple arterial branches-not thought that simple GDA embolization by interventional radiology would guarantee hemostasis-CT angiogram was deferred secondary to low platelet count-IVC filter was placed because of a saddle embolus and patient was discharged home  He returned to to [facility] ed 02/15/2013 with recurrent GI bleed-this started last pm with blood mixed with stool and then progressed with 3-4 episodes of frank bloody clots . He felt a little bit dizzy had no ill contacts, fevers chills nausea vomiting and had not been exposed antibiotics recently.  Dr. [name] of gastroenterology saw the patient and then recommended but he now covered stent placement with surgical temporizing intervention-this was attempted 12/16 unfortunately stent was not deployed both secondary to its shortness.   Past medical history-As per Problem list Chart reviewed as below- See above extensive  Consultants:  Gastroenterology  Oncology  Interventional radiology  Suggest palliative care?  Procedures:  EGD 12/16  Antibiotics:  None    Subjective  Flat affect however bread and supper little on talking a little more Not in any significant abdominal pain at present  N.p.o. for procedure when I saw him Not dizzy but has not ambulated much   only one stool 12/15 -was  dark stools no hematochezia per nursing    Objective    Interim History:  none   Telemetry: Sinus   Objective: Filed Vitals:   02/15/13 2300 02/15/13 2310 02/16/13 0000 02/16/13 0415  BP: 114/63 109/65  127/72  Pulse: 88 97 93 101  Temp: 99.3 F (37.4 C)   99.1 F (37.3 C)  TempSrc:    Oral  Resp: 16 20 17 22   Height:      Weight:      SpO2: 97% 97% 97% 97%    Intake/Output Summary (Last 24 hours) at 02/16/13 0751 Last data filed at 02/16/13 0500  Gross per 24 hour  Intake   2085 ml  Output    325 ml  Net   1760 ml    Exam:  General:  flat affect Body mass index is 26.47 kg/(m^2). Cardiovascular:  S1-S2 no murmur rub or gallop  Respiratory:  clinically clear  Abdomen:  nontender nondistended Skin soft nontender Neuro intact  Data Reviewed: Basic Metabolic Panel:  Recent Labs Lab 02/10/13 0445 02/11/13 0625 02/12/13 0445 02/13/13 0500 02/14/13 0500 02/15/13 1036  NA 134* 133* 135 131* 135 133*  K 2.9* 3.3* 3.3* 3.7 3.4* 3.8  CL 104 105 105 104 107 104  CO2 21 22 21 19 21  18*  GLUCOSE 104* 115* 105* 123* 124* 135*  BUN 19 14 13 12 13 14   CREATININE 0.80 0.93 0.88 0.88 0.90 0.89  CALCIUM 7.7* 8.5 8.4 8.2* 8.4 8.1*  MG 1.6 1.7 1.8 1.8 1.9  --    Liver Function Tests:  Recent Labs Lab 02/11/13 0625 02/12/13 0445 02/13/13 0500 02/14/13 0500 02/15/13 1036  AST 24 27 25 22 25   ALT 21 22 21 22  21  ALKPHOS 88 87 76 85 81  BILITOT 0.5 0.4 0.3 0.5 0.4  PROT 5.0* 5.1* 4.6* 5.1* 5.0*  ALBUMIN 2.3* 2.2* 1.9* 2.3* 2.2*   No results found for this basename: LIPASE, AMYLASE,  in the last 168 hours No results found for this basename: AMMONIA,  in the last 168 hours CBC:  Recent Labs Lab 02/10/13 0445  02/11/13 0625  02/12/13 0445  02/13/13 0500 02/14/13 0500 02/15/13 1036 02/16/13 0245 02/16/13 0530  WBC 7.7  --  6.0  --  5.2  --  5.6 6.8 9.5 8.7 8.4  NEUTROABS 6.2  --  4.4  --  3.9  --  4.2 5.1  --   --   --   HGB 9.4*  < > 9.5*  < > 9.1*   < > 8.0* 9.0* 7.3* 7.8* 7.7*  HCT 26.9*  --  27.9*  --  26.9*  < > 22.9* 26.3* 20.6* 22.3* 22.4*  MCV 84.6  --  85.8  --  86.2  --  85.8 86.2 86.9 86.1 86.5  PLT 54*  --  48*  --  54*  --  49* 70* 85* 87* 69*  < > = values in this interval not displayed. Cardiac Enzymes: No results found for this basename: CKTOTAL, CKMB, CKMBINDEX, TROPONINI,  in the last 168 hours BNP: No components found with this basename: POCBNP,  CBG: No results found for this basename: GLUCAP,  in the last 168 hours  Recent Results (from the past 240 hour(s))  MRSA PCR SCREENING     Status: None   Collection Time    02/08/13 11:20 AM      Result Value Range Status   MRSA by PCR NEGATIVE  NEGATIVE Final   Comment:            The GeneXpert MRSA Assay (FDA     approved for NASAL specimens     only), is one component of a     comprehensive MRSA colonization     surveillance program. It is not     intended to diagnose MRSA     infection nor to guide or     monitor treatment for     MRSA infections.  CLOSTRIDIUM DIFFICILE BY PCR     Status: None   Collection Time    02/13/13  1:10 PM      Result Value Range Status   C difficile by pcr NEGATIVE  NEGATIVE Final   Comment: Performed at [facility]     Studies:              All Imaging reviewed and is as per above notation   Scheduled Meds: . folic acid  2 mg Oral Daily  . lipase/protease/amylase  2 capsule Oral TID AC & HS  . pantoprazole (PROTONIX) IV  40 mg Intravenous Q24H  . sodium chloride  3 mL Intravenous Q12H   Continuous Infusions: . sodium chloride 50 mL/hr at 02/16/13 0608     Assessment/Plan:   1. GI bleeds, multiple during admission-likely upper?--has history of sessile polyps as well but is not on anticoagulation or NSAIDs currently Hemoglobin dropped 2 points to 7.3 from last check 12/14, rec'd transfusion stat 12/15-Keep NPO until GI states otherwise 2. Coagulopathy-As his INR was 1.7 this morning, FFP 2 units given to reverse  coagulopathy  3. A-fib,, status post ablation/cardioversion 05/25/2012-clearly not a candidate for anticoagulation-sinus rhythm on telemetry now  4. Prior history seminoma status post radiation therapy-per oncology,  not clinically active  5. Personal history of colonic adenomas 04/22/12-removal of sessile polyps-see above  6. 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 Esophageal varices without bleeding-see above  8. Cancer of head of pancreas-hold pancrease for now keep n.p.o.- long discussion with interventional radiology Dr. Lowella Dandy on admission 12/15-unclear if any options for intervention from their standpoint, appreciate GI consult, Dr. [name] of oncology already has been contacted and is aware--he will alert Dr. Mitzi Hansen of radiation oncology-patient was to get third XRT Session on pm of admit-?[facility] oncology input, ? Jakafi?  9. Acute blood loss anemia- Hematology does not recommend platelets instead recommends RBC packed cell transfusion. Difficult situation, patient states that he wants to be full CODE STATUS and would like everything done as possible for him-notified critical care medicine to please look in on him if he clinically decompensates-Will consider one more unit PRBC 10. Hypertension-hold metoprolol 12.5 twice a day right now  11. Diarrhea hold Lomotil right now   Code Status: full code confirmed Family Communication: none at bedside Disposition Plan: inpt, SDU   Pleas Koch, MD  [facility] Pager [phone] 02/16/2013, 7:51 AM    LOS: 1 day

## 2013-02-16 NOTE — ED Provider Notes (Signed)
Medical screening examination/treatment/procedure(s) were performed by non-physician practitioner and as supervising physician I was immediately available for consultation/collaboration.  Kienan Doublin T Carnesha Maravilla, MD 02/16/13 0815 

## 2013-02-16 NOTE — Op Note (Signed)
[facility] [address] [place], [address]   ENDOSCOPY PROCEDURE REPORT  PATIENT: Austin Escobar, Austin Escobar  MR#: [id number] BIRTHDATE: January 23, 1956 , 57  yrs. old GENDER: Male ENDOSCOPIST: Louis Meckel, MD REFERRED BY: PROCEDURE DATE:  02/16/2013 PROCEDURE:  EGD, diagnostic ASA CLASS:     Class IV INDICATIONS:  bleeding from tumor infiltration of the duodenal bulb. Attempted stent placement. MEDICATIONS: MAC sedation, administered by CRNA TOPICAL ANESTHETIC:  DESCRIPTION OF PROCEDURE: After the risks benefits and alternatives of the procedure were thoroughly explained, informed consent was obtained.  The    endoscope was introduced through the mouth and advanced to the third portion of the duodenum. Without limitations. The instrument was slowly withdrawn as the mucosa was fully examined.      Grade 2-3 varices were seen in the distal esophagus. Again noted in the duodenal bulb was friable it goes along the anterior wall encompassing the latter half of the bulb and the junction between the bulb and the second portion of the duodenum. There was slight compression of the duodenal bulb.  The gastroscope, with moderate resistance was  passed through the narrowed area.  A SEM stent was seen protruding from the ampulla.  A wire was placed beyond the third portion of the duodenum and the scope was withdrawn.  A covered esophageal stent was passed over the wire to  the apex of the antrum.  With the delivery system there was not enough length to pass the stent into the duodenal bulb and across the stricture.  Several attempts were made but the length of the deployment system did not reach the duodenal bulb and second portions.  .  Retroflexion was not performed.     The scope was then withdrawn from the patient and the procedure completed.  COMPLICATIONS: There were no complications. ENDOSCOPIC IMPRESSION: 1.   grade 2-3 esophageal varices 2.  infiltrating tumor  in the duodenal bulb causing bleeding 3.  technical failure at placing a duodenal stent because of inadequate length of the delivery system  RECOMMENDATIONS: 1.  should immediately be recur would proceed with attempt at angiography with embolization REPEAT EXAM:  eSigned:  Louis Meckel, MD 02/16/2013 1:34 PM   CC:

## 2013-02-16 NOTE — Interval H&P Note (Signed)
History and Physical Interval Note:  02/16/2013 11:48 AM  Austin Escobar  has presented today for surgery, with the diagnosis of gastrointestinal bleeding  The various methods of treatment have been discussed with the patient and family. After consideration of risks, benefits and other options for treatment, the patient has consented to  Procedure(s): ESOPHAGOGASTRODUODENOSCOPY (EGD) (N/A) DUODENAL STENT PLACEMENT (N/A) as a surgical intervention .  The patient's history has been reviewed, patient examined, no change in status, stable for surgery.  I have reviewed the patient's chart and labs.  Questions were answered to the patient's satisfaction.     The recent H&P (dated *02/15/13**) was reviewed, the patient was examined and there is no change in the patients condition since that H&P was completed.   Austin Escobar  02/16/2013, 11:48 AM   Austin Escobar

## 2013-02-16 NOTE — Progress Notes (Signed)
[facility] Radiation Oncology Dept Therapy Treatment Record Phone [phone]   Radiation Therapy was administered to Austin Escobar on: 02/16/2013  6:06 PM and was treatment #3 out of a planned course of10 treatments.

## 2013-02-16 NOTE — Progress Notes (Signed)
Unable to place duodenal stent because the delivery system for the substituted esophageal covered stent was too short.  Should the patient develop recurrent bleeding would proceed with angiography and attempt at embolization.  In the internal, we will keep trying to locate a manufacturer that produces a covered duodenal stent.

## 2013-02-16 NOTE — Telephone Encounter (Signed)
Called ICU patient in 1241 , spoke with nurse Denise,RN, asked statsu, "he is having EGD now"doesn't feel good and probably will not want to come for rad tx,", I thanked her and stated that I would call back around 1-2pm  To re afirm if he will have treatment or not, 10:16 AM

## 2013-02-16 NOTE — Anesthesia Postprocedure Evaluation (Signed)
  Anesthesia Post-op Note  Patient: Austin Escobar  Procedure(s) Performed: Procedure(s) (LRB): ESOPHAGOGASTRODUODENOSCOPY (EGD) (N/A) DUODENAL STENT PLACEMENT (N/A)  Patient Location: PACU  Anesthesia Type: MAC  Level of Consciousness: awake and alert   Airway and Oxygen Therapy: Patient Spontanous Breathing  Post-op Pain: mild  Post-op Assessment: Post-op Vital signs reviewed, Patient's Cardiovascular Status Stable, Respiratory Function Stable, Patent Airway and No signs of Nausea or vomiting  Last Vitals:  Filed Vitals:   02/16/13 1459  BP: 138/70  Pulse:   Temp:   Resp: 24    Post-op Vital Signs: stable   Complications: No apparent anesthesia complications
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
## 2013-02-16 NOTE — Progress Notes (Deleted)
Full note to follow inr 1.7 For possible procedure this am Give FFP NOW Check INR Rest per GI  Pleas Koch, MD [facility] [phone]

## 2013-02-16 NOTE — Anesthesia Preprocedure Evaluation (Addendum)
Anesthesia Evaluation  Patient identified by MRN, date of birth, ID band Patient awake    Reviewed: Allergy & Precautions, H&P , NPO status , Patient's Chart, lab work & pertinent test results  Airway Mallampati: II TM Distance: >3 FB Neck ROM: Full    Dental no notable dental hx.    Pulmonary asthma ,  H/O saddle embolus, s/p IVC filter. breath sounds clear to auscultation  Pulmonary exam normal       Cardiovascular hypertension, Pt. on medications and Pt. on home beta blockers negative cardio ROS  + dysrhythmias Atrial Fibrillation Rhythm:Regular Rate:Normal     Neuro/Psych PSYCHIATRIC DISORDERS negative neurological ROS     GI/Hepatic Neg liver ROS, Pancreatic cancer, metastatic. Current GI bleed.   Endo/Other  negative endocrine ROS  Renal/GU negative Renal ROS  negative genitourinary   Musculoskeletal negative musculoskeletal ROS (+)   Abdominal   Peds negative pediatric ROS (+)  Hematology  (+) anemia , HGB 7.7 Thrombocytopenia. Elevated INR.   Anesthesia Other Findings   Reproductive/Obstetrics negative OB ROS                          Anesthesia Physical Anesthesia Plan  ASA: IV  Anesthesia Plan: MAC   Post-op Pain Management:    Induction: Intravenous  Airway Management Planned:   Additional Equipment:   Intra-op Plan:   Post-operative Plan:   Informed Consent: I have reviewed the patients History and Physical, chart, labs and discussed the procedure including the risks, benefits and alternatives for the proposed anesthesia with the patient or authorized representative who has indicated his/her understanding and acceptance.   Dental advisory given  Plan Discussed with: CRNA  Anesthesia Plan Comments:         Anesthesia Quick Evaluation

## 2013-02-17 ENCOUNTER — Encounter (HOSPITAL_COMMUNITY): Payer: Self-pay | Admitting: Gastroenterology

## 2013-02-17 ENCOUNTER — Ambulatory Visit
Admit: 2013-02-17 | Discharge: 2013-02-17 | Disposition: A | Discharge status: Home / Self Care | Payer: BC Managed Care – PPO | Attending: Radiation Oncology | Admitting: Radiation Oncology

## 2013-02-17 ENCOUNTER — Inpatient Hospital Stay (HOSPITAL_COMMUNITY): Payer: BC Managed Care – PPO

## 2013-02-17 DIAGNOSIS — D62 Acute posthemorrhagic anemia: Secondary | ICD-10-CM

## 2013-02-17 DIAGNOSIS — I4891 Unspecified atrial fibrillation: Secondary | ICD-10-CM

## 2013-02-17 LAB — CBC
HCT: 21 % — ABNORMAL LOW (ref 39.0–52.0)
HCT: 21.2 % — ABNORMAL LOW (ref 39.0–52.0)
Hemoglobin: 7.2 g/dL — ABNORMAL LOW (ref 13.0–17.0)
Hemoglobin: 8.8 g/dL — ABNORMAL LOW (ref 13.0–17.0)
MCH: 30.2 pg (ref 26.0–34.0)
MCHC: 34.3 g/dL (ref 30.0–36.0)
MCHC: 34.9 g/dL (ref 30.0–36.0)
MCHC: 34.6 g/dL (ref 30.0–36.0)
MCV: 88 fL (ref 78.0–100.0)
MCV: 87.3 fL (ref 78.0–100.0)
Platelets: 93 10*3/uL — ABNORMAL LOW (ref 150–400)
Platelets: 135 10*3/uL — ABNORMAL LOW (ref 150–400)
RBC: 2.39 MIL/uL — ABNORMAL LOW (ref 4.22–5.81)
RDW: 17.1 % — ABNORMAL HIGH (ref 11.5–15.5)
RDW: 17.3 % — ABNORMAL HIGH (ref 11.5–15.5)
RDW: 16.9 % — ABNORMAL HIGH (ref 11.5–15.5)
WBC: 8.9 10*3/uL (ref 4.0–10.5)
WBC: 8.1 10*3/uL (ref 4.0–10.5)

## 2013-02-17 LAB — BASIC METABOLIC PANEL
Calcium: 8.1 mg/dL — ABNORMAL LOW (ref 8.4–10.5)
Chloride: 106 mEq/L (ref 96–112)
GFR calc Af Amer: >90 mL/min (ref >90)
Potassium: 3.4 mEq/L — ABNORMAL LOW (ref 3.5–5.1)
Sodium: 134 mEq/L — ABNORMAL LOW (ref 135–145)

## 2013-02-17 LAB — PREPARE FRESH FROZEN PLASMA
Unit division: 0
Unit division: 0

## 2013-02-17 LAB — PREPARE PLATELET PHERESIS: Unit division: 0

## 2013-02-17 LAB — PREPARE RBC (CROSSMATCH)

## 2013-02-17 LAB — PROTIME-INR
INR: 1.91 — ABNORMAL HIGH (ref 0.00–1.49)
Prothrombin Time: 21.3 seconds — ABNORMAL HIGH (ref 11.6–15.2)

## 2013-02-17 MED ORDER — LIDOCAINE HCL 1 % IJ SOLN
INTRAMUSCULAR | Status: AC
  Filled 2013-02-17: qty 20

## 2013-02-17 MED ORDER — FENTANYL CITRATE 0.05 MG/ML IJ SOLN
INTRAMUSCULAR | Status: AC
  Filled 2013-02-17: qty 4

## 2013-02-17 MED ORDER — MIDAZOLAM HCL 2 MG/2ML IJ SOLN
INTRAMUSCULAR | Status: AC
  Filled 2013-02-17: qty 4

## 2013-02-17 MED ORDER — SODIUM CHLORIDE 0.9 % IV SOLN
INTRAVENOUS | Status: DC
  Administered 2013-02-17 – 2013-02-20 (×5): via INTRAVENOUS
  Administered 2013-02-20: 50 mL/h via INTRAVENOUS
  Administered 2013-02-22: 06:00:00 via INTRAVENOUS

## 2013-02-17 MED ORDER — FENTANYL CITRATE 0.05 MG/ML IJ SOLN
INTRAMUSCULAR | Status: AC
  Filled 2013-02-17: qty 6

## 2013-02-17 MED ORDER — MIDAZOLAM HCL 2 MG/2ML IJ SOLN
INTRAMUSCULAR | Status: AC
  Filled 2013-02-17: qty 6

## 2013-02-17 MED ORDER — FENTANYL CITRATE 0.05 MG/ML IJ SOLN
INTRAMUSCULAR | Status: AC | PRN
  Administered 2013-02-17 (×2): 50 ug via INTRAVENOUS

## 2013-02-17 MED ORDER — MIDAZOLAM HCL 2 MG/2ML IJ SOLN
INTRAMUSCULAR | Status: AC | PRN
  Administered 2013-02-17 (×3): 1 mg via INTRAVENOUS
  Administered 2013-02-17: 0.5 mg via INTRAVENOUS

## 2013-02-17 NOTE — Progress Notes (Signed)
Pt was referred for spiritual consult by nursing staff due to significant other was having some issues with pt being in hospital. Chaplain visited pt to offer any services needed. Pt brother was with him, but no other family of significant other was present. Pt was very cordial and talkative. He denied any services was needed at this time. Chaplain informed pt if he desired any services from chaplain to please inform the nursing staff.   Cindie Crumbly, [address]

## 2013-02-17 NOTE — Progress Notes (Signed)
Patient ID: Austin Escobar, male   DOB: 27-May-1955, 57 y.o.   MRN: [id number] [facility] Progress Note  Subjective: No active bleeding since Monday. Pain controlled currently- taking full liquid diet. Pt aware he could "bleed out" . Hgb 7.4 this am-stable  Overnight IR aware- and came in to round while i was with pt- they prefer to wait until he bleeds  again then go straight to arteriogram  Objective:  Vital signs in last 24 hours: Temp:  [97 F (36.1 C)-99.1 F (37.3 C)] 98.9 F (37.2 C) (12/17 0800) Pulse Rate:  [79-92] 81 (12/16 1100) Resp:  [11-29] 21 (12/17 0440) BP: (118-144)/(65-84) 139/77 mmHg (12/17 0440) SpO2:  [93 %-100 %] 96 % (12/17 0440) Weight:  [198 lb 13.7 oz (90.2 kg)] 198 lb 13.7 oz (90.2 kg) (12/17 0400) Last BM Date: 02/16/13 General:   Alert,  Well-developed,  WM  in NAD Heart:  Regular rate and rhythm; no murmurs Pulm;clear Abdomen:  Soft, tender upper abdomen and nondistended. Normal bowel sounds, without guarding, and without rebound.   Extremities:  Without edema. Neurologic:  Alert and  oriented x4;  grossly normal neurologically. Psych:  Alert and cooperative. Normal mood and affect.  Intake/Output from previous day: 12/16 0701 - 12/17 0700 In: 3263.9 [I.V.:2243.3; Blood:1020.5] Out: 2250 [Urine:2250] Intake/Output this shift:    Lab Results:  Recent Labs  02/16/13 1940 02/17/13 0200 02/17/13 0805  WBC 9.2 8.9 8.1  HGB 7.9* 7.2* 7.4*  HCT 22.7* 21.0* 21.2*  PLT 78* 96* 93*   BMET  Recent Labs  02/15/13 1036  NA 133*  K 3.8  CL 104  CO2 18*  GLUCOSE 135*  BUN 14  CREATININE 0.89  CALCIUM 8.1*   LFT  Recent Labs  02/15/13 1036  PROT 5.0*  ALBUMIN 2.2*  AST 25  ALT 21  ALKPHOS 81  BILITOT 0.4   PT/INR  Recent Labs  02/16/13 0530  LABPROT 20.2*  INR 1.78*      Assessment / Plan: #1  57 yo male with metastatic pancreatic cancer with invasion of duodenum and recurrent Gi bleeding,involvement of  hepatic artery, gastroduodenal artery , And mesenteric vein thrombosis  No plans for repeat attempt  at duodenal stenting, unlikely to offer much benefit Pt needs to remain in hospital for several days for observation as likelihood is he will rebleed - and then plan for emergent arteriogram and embolization.  #2 anemia- stable- will transfuse one more unit to give him  some buffer.  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

## 2013-02-17 NOTE — Progress Notes (Signed)
[facility] PROGRESS NOTE  BESSIE LIVINGOOD ZOX:[id number] DOB: 1955-04-20 DOA: 02/15/2013 PCP: [name], DO  Interim history 57 y.o. 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 Due to poor prognosis, patient was referred for possible trial at [facility] by Dr. [name] (oncologist), although not yet accepted. He was DNR/DNI.   Readmit on 12/15 and wanted to change to FULL CODE.  Recurrent GIB beginning on the evening of 12/14, with blood mixed with stool and then progressed with 3-4 episodes of frank bloody clots.  He had subsequent dizziness.  Hgb on  admission was 7.3 (was 9.0 on discharge) , requiring 2 units of blood. He also received 2 units of FFP 02/16/2013 due to INR 1.7.  He has had a bowel movement this morning without any visible blood.  Dr. [name] attempted duodenal stent to assist with tamponade of bleed, but this was unsuccessful due to delivery system being too short.   Assessment/Plan:  Acute blood loss anemia  -Has received 2 units PRBCs during this admission -Transfuse one additional unit today -IR previously skeptical arterial embolization will help since there are several vessels encased by tumor -IR aware of need of arterial embolization if rebleeds -appreciate GI and IR followup -Avoid NSAIDs -Hemoglobin has remained fairly stable. Patient is hemodynamically stable -Full liquid diet for now -origin of bleed likely from duodenal bulb where there is tumor invasion Coagulopathy -Received 2 units FFP 02/16/2013 when his INR was 1.7 -Recheck INR in the morning Esophageal varices -No bleeding noted from previous endoscopy -Continue Protonix and Carafate  Saddle pulmonary embolus -12/23/2012 -Status post IVC filter in December 2014 -Currently stable without any respiratory distress or hypoxemia Atrial fibrillation -Status post ablation 05/25/2012 -Currently in sinus rhythm -Not a candidate for anticoagulation Metastatic pancreatic adenocarcinoma with duodenal invasion -Appreciate oncology followup -Await recommendations regarding XRT -Dr. [name] is following -FULL CODE HTN -Metoprolol tartrate is on hold due to soft blood pressures Family Communication:  Brother updated at bedside Disposition Plan:   Remain in SDU--total time 35 min         Procedures/Studies: Ir Ivc Filter Plmt / S&i /img Guid/mod Sed  02/11/2013   CLINICAL DATA:  Metastatic pancreatic carcinoma. History of pulmonary embolism. GI bleeding, a relative contraindication to anticoagulation.  EXAM: INFERIOR VENACAVOGRAM  IVC FILTER  PLACEMENT UNDER FLUOROSCOPY  TECHNIQUE: The procedure, risks (including but not limited to bleeding, infection, organ damage ), benefits, and alternatives were explained to the patient. Questions regarding the procedure were encouraged and answered. The patient understands and consents to the procedure. Caval anatomy reviewed on previous CT abdomen. Patency of the right IJ vein was confirmed with ultrasound with image documentation. An appropriate skin site was determined. Skin site was marked, prepped with chlorhexidine, and draped using maximum barrier technique. The region was infiltrated locally with 1% lidocaine.  Intravenous Fentanyl and Versed were administered as conscious sedation during continuous cardiorespiratory monitoring by the radiology RN, with a total moderate sedation time of less than 30 minutes.  Under real-time ultrasound guidance, the right IJ vein was accessed with a 21 gauge micropuncture needle; the needle tip within the vein was confirmed with ultrasound image documentation. The needle was exchanged over a 018 guidewire for a transitional dilator, which allow advancement of the [facility] wire into the IVC. A long 6 French vascular sheath was placed for inferior venacavography. This demonstrated no caval thrombus. Renal vein inflows were evident.  The [facility] IVC filter was advanced through the sheath and successfully deployed under fluoroscopy at the L2-3 disc level. Followup cavagram demonstrates stable filter position and no evident complication. The sheath was removed and hemostasis achieved at the site. No immediate complication.  FLUOROSCOPY TIME:  36 seconds  IMPRESSION: 1. Normal IVC. No thrombus or significant anatomic variation. 2. Technically successful infrarenal IVC filter placement. This is a retrievable model.   Electronically Signed   By: Oley Balm M.D.   On: 02/11/2013 13:43   Dg C-arm 1-60 Min-no Report  02/16/2013   CLINICAL DATA: stent placement   C-ARM 1-60  MINUTES  Fluoroscopy was utilized by the requesting physician.  No radiographic  interpretation.    Ct Angio Abd/pel W/ And/or W/o  02/11/2013   CLINICAL DATA:  Metastatic pancreatic carcinoma with GI bleed due to duodenal invasion. CT angiography is performed to assess vasculature prior to potential arteriography and transcatheter embolization to treat persistent bleeding.  EXAM: CT ANGIOGRAPHY ABDOMEN AND PELVIS WITH CONTRAST AND WITHOUT CONTRAST  TECHNIQUE: Multidetector CT imaging of the abdomen and pelvis was performed using the standard protocol during bolus administration of intravenous contrast. Multiplanar reconstructed images including MIPs were obtained and reviewed to evaluate the vascular anatomy.  CONTRAST:  OMNIPAQUE IOHEXOL 350 MG/ML SOLN  COMPARISON:  12/23/2012  FINDINGS: There is significant enlargement of the mass involving the head of the pancreas since the prior CT in October. Dimensions of the mass are now approximately 3.7 x 5.6 x 4.5 cm. The mass visibly encases the pre-existing biliary stent and also extends further to encase the gastroduodenal artery, portal venous confluence, hepatic artery and a segment of the superior mesenteric vein. 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 Portal flow is present in the right lobe.  Significant progression of metastatic disease in the liver is also identified in both left and right lobes. Index lesion in the inferior right lobe shows enlargement with dimensions of 2.5 x 3.5 cm (1.6 x 2.1 cm previously). The largest left lobe lesion has enlarged from a maximum diameter of 2.4 cm previously to estimated current maximum diameter of 4.0 cm. All previously identified metastatic lesions have increased significantly in size. There are at least 4 new metastatic  lesions identified in the  liver. There is no evidence of associated biliary obstruction with air remaining a biliary tree. The common bile duct stent lumen does not appear to be compromised.  There is new ascites with mild to moderate overall volume of ascites present as well as nodularity of the mesenteric and omentum suspicious for carcinomatosis. No bowel obstruction or perforation is identified.  Arterial vasculature evaluated by CTA shows no evidence of significant occlusive disease. The aorta and major visceral branches are normally patent. At the level of the celiac trunk, the common hepatic artery is narrowed and encased by tumor but remains open with flow noted in both right and left hepatic arteries. The gastroduodenal artery is open but encased by tumor. Small distal GDA branches are present supplying the pancreatic mass. There also appears to be pancreaticoduodenal supply from the SMA at the level of the pancreatic mass.  The superior mesenteric artery abuts tumor but is not visibly narrowed. Focal nonocclusive thrombus or direct tumor invasion is identified in the superior aspect of the superior mesenteric vein causing luminal stenosis of approximately 60%. The splenic vein remains open.  Bilateral renal artery show normal patency. The inferior mesenteric vein is open. Bilateral iliac and common femoral arteries show normal patency.  Appearance of the gallbladder and kidneys are unremarkable. The spleen shows a new focal wedge-shaped infarct in its posterior and inferior aspect. The rest of the spleen shows normal perfusion. There is a stable left adrenal nodule. Bony structures show degenerative changes of the spine without evidence of focal lesions.  Review of the MIP images confirms the above findings.  IMPRESSION: 1. Significant progression of metastatic pancreatic carcinoma with enlargement of the mass at the level of the head of the pancreas and progression of hepatic metastatic disease. 2. 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 Tumor also encases and narrows the common hepatic artery, right and left hepatic arteries and gastroduodenal artery. There also is tumor involvement at the level of the superior mesenteric vein. 3. 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   Electronically Signed   By: Irish Lack M.D.   On: 02/11/2013 10:34         Subjective: Patient is feeling better this morning. Denies any fevers, chills, chest discomfort, shortness breath, nausea, vomiting, diarrhea, abdominal pain. He does have a bowel movement without blood. No dizziness or headache. No visual disturbance.  Objective: Filed Vitals:   02/17/13 0000 02/17/13 0400 02/17/13 0440 02/17/13 0800  BP: 124/65  139/77 135/77  Pulse:      Temp: 99 F (37.2 C) 99 F (37.2 C)  98.9 F (37.2 C)  TempSrc: Oral Oral  Oral  Resp: 14  21   Height:      Weight:  90.2 kg (198 lb 13.7 oz)    SpO2: 93%  96%     Intake/Output Summary (Last 24 hours) at 02/17/13 1154 Last data filed at 02/17/13 1000  Gross per 24 hour  Intake 2520.83 ml  Output   1100 ml  Net 1420.83 ml   Weight change: -0.8 kg (-1 lb 12.2 oz) Exam:   General:  Pt is alert, follows commands appropriately, not in acute distress  HEENT: No icterus, No thrush, [place]/AT  Cardiovascular: RRR, S1/S2, no rubs, no gallops  Respiratory: Bibasilar crackles. No wheezing. Good air movement.  Abdomen: Soft/+BS, non tender, non distended, no guarding  Extremities: trace LE edema, No lymphangitis, No petechiae, No rashes, no synovitis  Data Reviewed: Basic Metabolic Panel:  Recent Labs Lab 02/11/13 0625 02/12/13 0445 02/13/13 0500 02/14/13 0500 02/15/13 1036  NA 133* 135 131* 135 133*  K 3.3* 3.3* 3.7 3.4* 3.8  CL 105 105 104 107 104  CO2 22 21 19 21  18*  GLUCOSE 115* 105* 123* 124* 135*  BUN 14 13 12 13 14   CREATININE 0.93  0.88 0.88 0.90 0.89  CALCIUM 8.5 8.4 8.2* 8.4 8.1*  MG 1.7 1.8 1.8 1.9  --    Liver Function Tests:  Recent Labs Lab 02/11/13 0625 02/12/13 0445 02/13/13 0500 02/14/13 0500 02/15/13 1036  AST 24 27 25 22 25   ALT 21 22 21 22 21   ALKPHOS 88 87 76 85 81  BILITOT 0.5 0.4 0.3 0.5 0.4  PROT 5.0* 5.1* 4.6* 5.1* 5.0*  ALBUMIN 2.3* 2.2* 1.9* 2.3* 2.2*   No results found for this basename: LIPASE, AMYLASE,  in the last 168 hours No results found for this basename: AMMONIA,  in the last 168 hours CBC:  Recent Labs Lab 02/11/13 0625  02/12/13 0445  02/13/13 0500 02/14/13 0500  02/16/13 0530 02/16/13 1500 02/16/13 1940 02/17/13 0200 02/17/13 0805  WBC 6.0  --  5.2  --  5.6 6.8  < > 8.4 8.0 9.2 8.9 8.1  NEUTROABS 4.4  --  3.9  --  4.2 5.1  --   --   --   --   --   --   HGB 9.5*  < > 9.1*  < > 8.0* 9.0*  < > 7.7* 7.8* 7.9* 7.2* 7.4*  HCT 27.9*  --  26.9*  < > 22.9* 26.3*  < > 22.4* 22.3* 22.7* 21.0* 21.2*  MCV 85.8  --  86.2  --  85.8 86.2  < > 86.5 86.8 86.6 87.9 88.0  PLT 48*  --  54*  --  49* 70*  < > 69* 67* 78* 96* 93*  < > = values in this interval not displayed. Cardiac Enzymes: No results found for this basename: CKTOTAL, CKMB, CKMBINDEX, TROPONINI,  in the last 168 hours BNP: No components found with this basename: POCBNP,  CBG: No results found for this basename: GLUCAP,  in the last 168 hours  Recent Results (from the past 240 hour(s))  MRSA PCR SCREENING     Status: None   Collection Time    02/08/13 11:20 AM      Result Value Range Status   MRSA by PCR NEGATIVE  NEGATIVE Final   Comment:            The GeneXpert MRSA Assay (FDA     approved for NASAL specimens     only), is one component of a     comprehensive MRSA colonization     surveillance program. It is not     intended to diagnose MRSA     infection nor to guide or     monitor treatment for     MRSA infections.  CLOSTRIDIUM DIFFICILE BY PCR     Status: None   Collection Time    02/13/13  1:10 PM       Result Value Range Status   C difficile by pcr NEGATIVE  NEGATIVE Final   Comment: Performed at [facility]     Scheduled Meds: . folic acid  2 mg Oral Daily  . lipase/protease/amylase  2 capsule Oral TID AC & HS  .  pantoprazole (PROTONIX) IV  40 mg Intravenous Q24H  . sodium chloride  3 mL Intravenous Q12H  . sucralfate  2 g Oral Q6H   Continuous Infusions: . sodium chloride 50 mL/hr at 02/16/13 1558     Gabrial Domine, DO  [facility] Pager [phone]  If 7PM-7AM, please contact night-coverage [email or web address] Password [email or web address] 02/17/2013, 11:54 AM   LOS: 2 days

## 2013-02-17 NOTE — Procedures (Signed)
Post-Procedure Note  Pre-operative Diagnosis: Metastatic pancreatic cancer and recurrent upper GI bleeding       Post-operative Diagnosis: Metastatic pancreatic cancer   Indications:  Acute and recurrent GI bleeding  Procedure Details:   Informed consent obtained.  SMA and celiac arteriograms performed.  Difficulty cannulating the common hepatic artery due to attenuation and vasospasm.  The common hepatic artery was eventually cannulated with microcatheter but never able to cannulate the GDA.  Groin sheath removed with Exoseal closure device.  Findings: SMA:  Replaced right hepatic artery from SMA.  Severe narrowing of the main portal vein.  Celiac:  Irregularity and narrowing of common hepatic artery and proper hepatic artery.  Extensive vasospasm in common hepatic artery with minimal manipulation.  At one point, there appeared to retrograde flow into GDA.  At end of procedure, there was antegrade flow in common hepatic artery and GDA.  Irregularity of GDA and unable to gain access into the GDA.  No evidence for active bleeding or extravasation.    Complications: None     Condition: stable.    Plan: Bedrest for 4 hours.  Continue supportive care.

## 2013-02-17 NOTE — Progress Notes (Signed)
Patient ID: Austin Escobar, male   DOB: 11/09/1955, 57 y.o.   MRN: [id number]    Floor called - pt just passed a large grossly bloody BM.  Will increase IV fluids ,transfuse 2 units PRBCs quickly Contacted IR - spoke with  Dr. Lowella Dandy- he will proceed with Emergent arteriogram and embolization

## 2013-02-17 NOTE — Progress Notes (Signed)
Agree with plan for emergent angiography with possible embolization No further endoscopic options felt to be helpful (as previously discussed).

## 2013-02-17 NOTE — Progress Notes (Signed)
CSW assisted patient/family in completing Advance Directives. The patient designated his brother, Arbie Cookey as his primary healthcare agent and sister, Cordelia Pen as his secondary agent.  Patient also completed healthcare living will.  The patient designates he does not want life prolonging measures in the situations indicated in his living will.  Clinical Social Worker notarized documents and made copies for patient/family. Clinical Social Worker placed copy on shadow chart to be scanned into patient's chart.   Clinical Social Worker encouraged patient/family to contact with any additional questions or concerns.  Unice Bailey, LCSW [facility] Clinical Social Worker cell #: [phone]

## 2013-02-17 NOTE — Progress Notes (Signed)
Patient ID: Austin Escobar, male   DOB: February 18, 1956, 57 y.o.   MRN: [id number] Contacted by Mike Gip of GI.  Patient has been stable today but just recently started having bloody stools.  The endoscopic treatment options are limited at this time.  I discussed angiography and embolization with the patient.  Patient is agreeable to the procedure and will proceed with angiography this evening.

## 2013-02-17 NOTE — ED Notes (Signed)
5Fr sheath removed from RFA by Dr. Lowella Dandy. Hemostasis achieved using Exoseal device.  Manual pressure held X 5 min.  Groin level 0, 2+RDP, gauze/tegaderm drsg applied.

## 2013-02-17 NOTE — Progress Notes (Signed)
[facility] Radiation Oncology Dept Therapy Treatment Record Phone [phone]   Radiation Therapy was administered to Austin Escobar on: 02/17/2013  2:05 PM and was treatment # 4 out of a planned course of 10 treatments.

## 2013-02-17 NOTE — Progress Notes (Signed)
Patient seen, examined, and I agree with the above documentation, including the assessment and plan. Patient well aware of situation and that angiography would be the next for any acute rebleeding Endoscopic intervention is very unlikely to offer additional benefit Page IR stat with any rebleeding

## 2013-02-18 ENCOUNTER — Telehealth: Payer: Self-pay | Admitting: *Deleted

## 2013-02-18 ENCOUNTER — Ambulatory Visit
Admit: 2013-02-18 | Discharge: 2013-02-18 | Disposition: A | Discharge status: Home / Self Care | Payer: BC Managed Care – PPO | Attending: Radiation Oncology | Admitting: Radiation Oncology

## 2013-02-18 ENCOUNTER — Inpatient Hospital Stay (HOSPITAL_COMMUNITY): Payer: BC Managed Care – PPO

## 2013-02-18 ENCOUNTER — Encounter (HOSPITAL_COMMUNITY): Payer: Self-pay | Admitting: Gastroenterology

## 2013-02-18 DIAGNOSIS — C250 Malignant neoplasm of head of pancreas: Secondary | ICD-10-CM

## 2013-02-18 LAB — CBC
HCT: 22.2 % — ABNORMAL LOW (ref 39.0–52.0)
HCT: 25.3 % — ABNORMAL LOW (ref 39.0–52.0)
HCT: 28.3 % — ABNORMAL LOW (ref 39.0–52.0)
Hemoglobin: 7.6 g/dL — ABNORMAL LOW (ref 13.0–17.0)
Hemoglobin: 8.6 g/dL — ABNORMAL LOW (ref 13.0–17.0)
MCH: 30.1 pg (ref 26.0–34.0)
MCV: 88.5 fL (ref 78.0–100.0)
MCV: 87.1 fL (ref 78.0–100.0)
Platelets: ADEQUATE 10*3/uL (ref 150–400)
Platelets: 84 10*3/uL — ABNORMAL LOW (ref 150–400)
RBC: 2.52 MIL/uL — ABNORMAL LOW (ref 4.22–5.81)
RBC: 2.86 MIL/uL — ABNORMAL LOW (ref 4.22–5.81)
RDW: 16.7 % — ABNORMAL HIGH (ref 11.5–15.5)
RDW: 16.8 % — ABNORMAL HIGH (ref 11.5–15.5)
RDW: 17.2 % — ABNORMAL HIGH (ref 11.5–15.5)
WBC: 9.1 10*3/uL (ref 4.0–10.5)
WBC: 7.3 10*3/uL (ref 4.0–10.5)
WBC: 10 10*3/uL (ref 4.0–10.5)

## 2013-02-18 LAB — PREPARE RBC (CROSSMATCH)

## 2013-02-18 LAB — BASIC METABOLIC PANEL
Chloride: 110 mEq/L (ref 96–112)
GFR calc Af Amer: >90 mL/min (ref >90)
Potassium: 3.6 mEq/L (ref 3.5–5.1)

## 2013-02-18 LAB — PROTIME-INR: Prothrombin Time: 19.4 seconds — ABNORMAL HIGH (ref 11.6–15.2)

## 2013-02-18 MED ORDER — FENTANYL CITRATE 0.05 MG/ML IJ SOLN
INTRAMUSCULAR | Status: AC
  Filled 2013-02-18: qty 6

## 2013-02-18 MED ORDER — MIDAZOLAM HCL 2 MG/2ML IJ SOLN
INTRAMUSCULAR | Status: AC
  Filled 2013-02-18: qty 6

## 2013-02-18 MED ORDER — LIDOCAINE HCL 1 % IJ SOLN
INTRAMUSCULAR | Status: AC
  Filled 2013-02-18: qty 20

## 2013-02-18 MED ORDER — MIDAZOLAM HCL 2 MG/2ML IJ SOLN
INTRAMUSCULAR | Status: AC | PRN
  Administered 2013-02-18: 0.5 mg via INTRAVENOUS
  Administered 2013-02-18 (×3): 1 mg via INTRAVENOUS
  Administered 2013-02-18: 0.5 mg via INTRAVENOUS

## 2013-02-18 MED ORDER — NITROGLYCERIN 1 MG/10 ML FOR IR/CATH LAB
100.0000 ug | INTRA_ARTERIAL | Status: AC
  Administered 2013-02-18: 100 ug via INTRA_ARTERIAL

## 2013-02-18 MED ORDER — VITAMIN K1 10 MG/ML IJ SOLN
10.0000 mg | Freq: Every day | INTRAMUSCULAR | Status: DC
  Administered 2013-02-18 – 2013-02-19 (×2): 10 mg via SUBCUTANEOUS
  Filled 2013-02-18 (×3): qty 1

## 2013-02-18 MED ORDER — FENTANYL CITRATE 0.05 MG/ML IJ SOLN
INTRAMUSCULAR | Status: AC | PRN
  Administered 2013-02-18: 25 ug via INTRAVENOUS
  Administered 2013-02-18 (×2): 50 ug via INTRAVENOUS
  Administered 2013-02-18: 25 ug via INTRAVENOUS
  Administered 2013-02-18: 50 ug via INTRAVENOUS

## 2013-02-18 NOTE — Progress Notes (Signed)
   Department of Radiation Oncology  Phone:  [phone] Fax:        [phone]  INPATIENT   Weekly Treatment Note    Name: Austin Escobar Date: 02/18/2013 MRN: [id number] DOB: [birth year]   Current dose: 15 Gy  Current fraction: 5   MEDICATIONS: No current facility-administered medications for this encounter.   No current outpatient prescriptions on file.   Facility-Administered Medications Ordered in Other Encounters  Medication Dose Route Frequency Provider Last Rate Last Dose  . 0.9 %  sodium chloride infusion   Intravenous Continuous Rhetta Mura, MD   150 mL/hr at 02/17/13 1700  . 0.9 %  sodium chloride infusion   Intravenous Continuous Beverley Fiedler, MD 150 mL/hr at 02/17/13 2154    . folic acid (FOLVITE) tablet 2 mg  2 mg Oral Daily Rhetta Mura, MD   2 mg at 02/15/13 1621  . lidocaine (XYLOCAINE) 1 % (with pres) injection           . lipase/protease/amylase (CREON-12/PANCREASE) capsule 2 capsule  2 capsule Oral TID AC & HS Rhetta Mura, MD   2 capsule at 02/15/13 1620  . LORazepam (ATIVAN) tablet 1 mg  1 mg Oral Q6H PRN Rhetta Mura, MD      . morphine 2 MG/ML injection 2 mg  2 mg Intravenous Q3H PRN Josph Macho, MD      . pantoprazole (PROTONIX) injection 40 mg  40 mg Intravenous Q24H Meredith Pel, NP   40 mg at 02/18/13 1436  . phytonadione (VITAMIN K) SQ injection 10 mg  10 mg Subcutaneous Daily Josph Macho, MD   10 mg at 02/18/13 1436  . sodium chloride 0.9 % injection 3 mL  3 mL Intravenous Q12H Rhetta Mura, MD   3 mL at 02/16/13 2125  . sucralfate (CARAFATE) 1 GM/10ML suspension 2 g  2 g Oral Q6H Josph Macho, MD   2 g at 02/16/13 1840     ALLERGIES: Review of patient's allergies indicates no known allergies.   LABORATORY DATA:  Lab Results  Component Value Date   WBC 7.7 02/18/2013   HGB 8.6* 02/18/2013   HCT 25.3* 02/18/2013   MCV 88.5 02/18/2013   PLT 88* 02/18/2013   Lab Results  Component  Value Date   NA 139 02/18/2013   K 3.6 02/18/2013   CL 110 02/18/2013   CO2 22 02/18/2013   Lab Results  Component Value Date   ALT 21 02/15/2013   AST 25 02/15/2013   ALKPHOS 81 02/15/2013   BILITOT 0.4 02/15/2013     NARRATIVE: Austin Escobar was seen today for weekly treatment management. The chart was checked and the patient's films were reviewed. The patient is doing better. He states that the last couple of days have been fine in terms of his treatment.  No nausea.  PHYSICAL EXAMINATION:   Alert and oriented x3, in no acute distress, lying in a hospital bed in the treatment area  ASSESSMENT: The patient is doing satisfactorily with treatment. He has completed 5 fractions so far. We have 5 more fractions remaining. He is getting to the point where radiation should increasingly begin decreasing hopefully the risk of bleeding.  PLAN: We will continue with the patient's radiation treatment as planned.

## 2013-02-18 NOTE — Progress Notes (Signed)
I agree with the above documentation, including the assessment and plan.  

## 2013-02-18 NOTE — ED Notes (Addendum)
Pt transpported to room 7 from continued groin management, continues to ooze.  Manual pressure being applied by Charlsie Quest, RT, groin level 0.

## 2013-02-18 NOTE — ED Notes (Addendum)
Pt transported to 1241 in bed with RN and monitor.

## 2013-02-18 NOTE — ED Notes (Signed)
R groin dressed with gauze/tegaderm dressing. R groin level 0, drsg CDI, 2-3+RDP.

## 2013-02-18 NOTE — Telephone Encounter (Signed)
Linac #1 Irving Burton RT called asking if patient could have treatment today at 5:15pm, called the floor ICU 1241 spoke with DeniseRN, she stated he had to lay flat until 4:40pm, asked if he could have rad tx at 515pm, and if so if he is in pain to medicate prior to coming for rad tx today, "yes, he should be able to come for treatment at that time and will medicate him if needed for pain:"thanked RN,called Irving Burton, RT back and informed her of patient status 2:07 PM

## 2013-02-18 NOTE — Progress Notes (Signed)
Yesterday was a tough day for Austin Escobar. He had more GI bleeding. Interventional radiology tried to embolize the bleeder. It looks like this might be the GDA.  They were not successful because of arterial spasms. They may try today.  He was transfused with blood and platelets. This morning, his platelets on the CBC were "clumped". Hopefully this is a good sign that the platelet count is going up.  He still getting radiation. I think this probably will be our only option for him to stop the bleeding.  Is not hurting. He can eat but this is a limited by his procedures.  He's had no shortness of breath. He's had no cough.  I., again, had a long talk with him. We talked about the fact that we have very, very few options left. If interventional radiology think they can try another attempt at embolizing the GDA that'll be fine. If not, then radiation would be our only option to try to slow the bleeding down.  I told him that I still thought that the next 2 days would really decide what would happen. If he continues to bleed, then he understands that we probably would have to make a decision Re: continuing to transfuse him.  He still wants to try as much as possible.  I did not readdress CODE STATUS with him this morning. We will see how things go today.  I do not see any adjunct options to try to help slow down the bleeder.  His PT and PTT are slowly going up. I'll give him some vitamin K. I don't think he needs any FFP right now.  His vital signs all looked good. His temperature is 98. Blood pressure 119/68. Heart rate is 85. Oxygen saturation is 100%. His lungs are clear. Cardiac exam regular rate and rhythm with a normal S1-S2. There are no murmurs rubs or bruits. Abdomen is soft. Decent epigastric tenderness. There is no fluid wave. There is no palpable abdominal mass. Extremities shows no clubbing cyanosis or edema.  I did speak with his "significant other" last night. I gave her an update.  She clearly understands that he may not leave the hospital and that he can certainly bleed in a way that we cannot stop. She is very appreciative of all the great care in the herobic efforts better being made to try to help him.  I certainly do appreciate the outstanding care that he is receiving by all the doctors and the staff in the ICU.  [name]

## 2013-02-18 NOTE — Progress Notes (Signed)
Patient ID: Austin Escobar, male   DOB: [birth year], 57 y.o.   MRN: [id number] [facility] Progress Note  Subjective: Dr. Tama Escobar note reviewed and agree with his assessment. Unfortunately radiology  unable to embolize at arteriogram last night  Due to vasospasm . Plan is to re attempt later this am. HGB 7.6 this am after 3 units of blood yesterday  . One bloody BM about 1am, none since. Denies pain Discussed code status with pt -which is listed as full code currently- he signed papers yesterday/living will and does not want to be intubated, on life support,or have CPR .  Objective:  Vital signs in last 24 hours: Temp:  [97.4 F (36.3 C)-99.2 F (37.3 C)] 98.4 F (36.9 C) (12/18 0800) Pulse Rate:  [79-105] 85 (12/17 2010) Resp:  [10-25] 13 (12/18 0440) BP: (105-143)/(61-91) 119/68 mmHg (12/18 0440) SpO2:  [96 %-100 %] 100 % (12/18 0440) Last BM Date: 02/17/13 General:   Alert,  Well-developed,WM    in NAD Heart:  Regular rate and rhythm; no murmurs Pulm;clear Abdomen:  Soft,  Mildly tender and nondistended. Normal bowel sounds, without guarding, and without rebound.   Extremities:  Without edema. Neurologic:  Alert and  oriented x4;  grossly normal neurologically. Psych:  Alert and cooperative. Normal mood and affect.  Intake/Output from previous day: 12/17 0701 - 12/18 0700 In: 3137.5 [I.V.:2362.5; Blood:425] Out: 575 [Urine:575] Intake/Output this shift:    Lab Results:  Recent Labs  02/17/13 1640 02/18/13 0130 02/18/13 0810  WBC 13.2* 9.1 7.3  HGB 8.8* 8.2* 7.6*  HCT 25.4* 24.1* 22.2*  PLT 135* PLATELET CLUMPS NOTED ON SMEAR, COUNT APPEARS ADEQUATE 85*   BMET  Recent Labs  02/15/13 1036 02/17/13 1903 02/18/13 0130  NA 133* 134* 139  K 3.8 3.4* 3.6  CL 104 106 110  CO2 18* 21 22  GLUCOSE 135* 138* 106*  BUN 14 13 12   CREATININE 0.89 0.74 0.77  CALCIUM 8.1* 8.1* 8.6   LFT  Recent Labs  02/15/13 1036  PROT 5.0*  ALBUMIN 2.2*  AST 25   ALT 21  ALKPHOS 81  BILITOT 0.4   PT/INR  Recent Labs  02/17/13 1903 02/18/13 0130  LABPROT 21.3* 19.4*  INR 1.91* 1.69*     Assessment / Plan: #1  57 yo male with 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 For arteriogram again today with attempt at embolization Continue to support with transfusions for now- will give 2 more units Change code status to limited  Unfortunately we do not have any other options endoscopically to stop his bleeding. We will be available . 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

## 2013-02-18 NOTE — Procedures (Signed)
Post empiric GDA embolization for recurrent UGIB.  No immediate complications.  Keep right leg straight for 4 hrs.

## 2013-02-18 NOTE — Transfer of Care (Signed)
Immediate Anesthesia Transfer of Care Note  Patient: Austin Escobar  Procedure(s) Performed: Procedure(s): ESOPHAGOGASTRODUODENOSCOPY (EGD) (N/A) DUODENAL STENT PLACEMENT (N/A)  Patient Location: PACU  Anesthesia Type:MAC  Level of Consciousness: awake  Airway & Oxygen Therapy: Patient Spontanous Breathing  Post-op Assessment: Post -op Vital signs reviewed and stable  Post vital signs: stable  Complications: No apparent anesthesia complications

## 2013-02-18 NOTE — Progress Notes (Signed)
2 Days Post-Op  Subjective: Pt with one episode of GI bleeding since arteriography last night; otherwise without new c/o Objective: Vital signs in last 24 hours: Temp:  [97.4 F (36.3 C)-99.2 F (37.3 C)] 98.4 F (36.9 C) (12/18 0800) Pulse Rate:  [79-105] 85 (12/17 2010) Resp:  [10-25] 13 (12/18 0440) BP: (105-143)/(61-91) 119/68 mmHg (12/18 0440) SpO2:  [96 %-100 %] 100 % (12/18 0440) Last BM Date: 02/17/13  Intake/Output from previous day: 12/17 0701 - 12/18 0700 In: 3137.5 [I.V.:2362.5; Blood:425] Out: 575 [Urine:575] Intake/Output this shift:    Pt awake/alert; chest- CTA bilat; heart- RRR; abd- soft,+BS,NT; rt groin puncture site soft, NT , no hematoma; intact distal pulses.  Lab Results:   Recent Labs  02/18/13 0130 02/18/13 0810  WBC 9.1 7.3  HGB 8.2* 7.6*  HCT 24.1* 22.2*  PLT PLATELET CLUMPS NOTED ON SMEAR, COUNT APPEARS ADEQUATE 85*   BMET  Recent Labs  02/17/13 1903 02/18/13 0130  NA 134* 139  K 3.4* 3.6  CL 106 110  CO2 21 22  GLUCOSE 138* 106*  BUN 13 12  CREATININE 0.74 0.77  CALCIUM 8.1* 8.6   PT/INR  Recent Labs  02/17/13 1903 02/18/13 0130  LABPROT 21.3* 19.4*  INR 1.91* 1.69*   ABG No results found for this basename: PHART, PCO2, PO2, HCO3,  in the last 72 hours  Studies/Results: Dg C-arm 1-60 Min-no Report  02/16/2013   CLINICAL DATA: stent placement   C-ARM 1-60 MINUTES  Fluoroscopy was utilized by the requesting physician.  No radiographic  interpretation.     Anti-infectives: Anti-infectives   None      Assessment/Plan: Metastatic panc ca with recurrent GI bleeding, s/p mesenteric angio without evidence of active bleed 12/17 (unable to cannulate GDA). Plan is for repeat mesenteric angio today with poss GDA embolization. Details/risks of procedure d/w pt with his understanding and consent.   LOS: 3 days    ALLRED,D [facility] 02/18/2013

## 2013-02-18 NOTE — Progress Notes (Signed)
[facility] PROGRESS NOTE  Austin Escobar ZOX:[id number] DOB: 1955-10-09 DOA: 02/15/2013 PCP: Austin Lemons, DO  Interim history  57 y.o. 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 Due to poor prognosis, patient was referred for possible trial at [facility] by Dr. [name] (oncologist), although not yet accepted. He was DNR/DNI. Readmit on 12/15 and wanted to change to FULL CODE. Recurrent GIB beginning on the evening of 12/14, with blood mixed with stool and then progressed with 3-4 episodes of frank bloody clots. He had subsequent dizziness. Hgb on admission was  7.3 (was 9.0 on discharge) , requiring 2 units of blood. He also received 2 units of FFP 02/16/2013 due to INR 1.7. He has had a bowel movement this morning without any visible blood. Dr. [name] attempted duodenal stent to assist with tamponade of bleed, but this was unsuccessful due to delivery system being too short.  Assessment/Plan: Acute blood loss anemia  -2 additional units PRBC 12/18 -Has received 7 units PRBCs during this admission; 2units FFP -IR previously skeptical arterial embolization will help since there are several vessels encased by tumor  -IR aware of need of arterial embolization if rebleeds  -appreciate GI and IR followup  -Avoid NSAIDs  -Hemoglobin has remained fairly stable. Patient is hemodynamically stable  -Full liquid diet for now  -origin of bleed likely from duodenal bulb where there is tumor invasion  GI bleed -bloody stool 12/17 -failed attempt to embolize gastroduodenal artery (GDA) by IR due to vasospasm -12/18 repeated successful GDA coiling -only option left is XRT to help control bleeding -appreciate GI followup Coagulopathy  -Received 2 units FFP 02/16/2013 when his INR was 1.7  -vitamin K given 12/18 Esophageal varices  -No bleeding noted from previous endoscopy  -Continue Protonix and Carafate  Saddle pulmonary embolus  -12/23/2012  -Status post IVC filter in December 2014  -Currently stable without any respiratory distress or hypoxemia  Atrial fibrillation  -Status post ablation 05/25/2012  -Currently in sinus rhythm  -Not a candidate for anticoagulation  Metastatic pancreatic adenocarcinoma with duodenal invasion  -Appreciate oncology followup  -Await recommendations regarding XRT  -Dr. [name] is following  -LIMITED CODE--NO CPR, NO SHOCK, NO INTUBATION, YES VASOPRESSOR MEDS HTN  -Metoprolol tartrate is on hold due to soft blood pressures  Family Communication: Brother updated at  bedside  Disposition Plan: Remain in  SDU      Procedures/Studies: Ir Angiogram Visceral Selective  02/18/2013   INDICATION: History of metastatic pancreatic cancer, now with acute and recurrent upper GI bleeding from the duodenum based on recent endoscopy. Post failed attempted endoscopic duodenal stent placement and mesenteric arteriogram  EXAM: 1.  CELIAC ARTERIOGRAM  2.  COMMON HEPATIC ARTERIOGRAM (3RD ORDER).  3. SUB SELECTIVE GASTRODUODENAL ARTERIOGRAM (3RD ORDER) WITH PARTICLE AND COIL EMBOLIZATION  4.  ULTRASOUND GUIDANCE FOR ARTERIAL ACCESS  MEDICATIONS: Nitroglycerin 100 mcg IA  TECHNIQUE: Informed written consent was obtained from the patient after a discussion of the risks, benefits and alternatives to treatment. Questions regarding the procedure were encouraged and answered. A timeout was performed prior to the initiation of the procedure.  The right groin was prepped and draped in the usual sterile fashion, and a sterile drape was applied covering the operative field. Maximum barrier sterile technique with sterile gowns and gloves were used for the procedure. A timeout was performed prior to the initiation of the procedure. Local anesthesia was provided with 1% lidocaine.  The right femoral head was marked fluoroscopically. Under ultrasound guidance, the right common femoral artery was accessed with a micropuncture kit after the overlying soft tissues were anesthetized with 1% lidocaine. An ultrasound image was saved for documentation purposes. The micropuncture sheath was exchanged for a 5 [place] vascular sheath over a Bentson wire. A closure arteriogram was performed through the side of the sheath confirming access within the right common femoral artery.  Over a Bentson wire, a Mickelson catheter was advanced to the level of the thoracic aorta where it was back bled and flushed. The catheter was then utilized to select the celiac artery and a celiac arteriogram was performed. 100 mcg of nitroglycerin was not administered  intra-arterially through the celiac arterial vascular distribution.  With the use of a Fathom 14 microwire, a regular Renegade microcatheter was advanced into the common hepatic artery and a common hepatic arteriogram was performed. The microcatheter was then advanced into the distal aspect of the gastroduodenal artery and a sub selective gastroduodenal arteriogram was performed.  Initially, the distal tributaries of the GDA were embolized with 700-900 micron Embospheres. Limited post particle embolization arteriogram was performed. Subsequently, the gastroduodenal artery was coil embolized to near its origin with a combination of overlapping 2 and 3 mm diameter interlock coils.  The micro catheter was retracted into the common hepatic artery and a repeat common hepatic arteriogram was performed. The micro catheter was removed and a post embolization celiac arteriogram was performed. All images were reviewed and the procedure was terminated. All wires, catheters and sheaths were removed from the patient. Hemostasis was achieved at the right groin access site with manual compression.  ANESTHESIA/SEDATION: Fentanyl 200 mcg IV; Versed 4 mg IV  60 minutes  CONTRAST:  50 cc Omnipaque 300  COMPARISON:  None.  FLUOROSCOPY TIME:  11 min, 42 seconds  COMPLICATIONS: None immediate  FINDINGS: Celiac arteriogram demonstrates persistent irregular narrowing of both the common and proper hepatic arteries extending preferentially involve the origin of the right hepatic artery.  The gastroduodenal artery remains patent though irregularly narrowed throughout its course.  The gastroduodenal artery was successfully cannulated and embolized with a combination of 700 to 900 micron Embospheres and overlapping interlock coils to near its origin. 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  IMPRESSION: 1. Successful prophylactic particle and coil embolization of the gastroduodenal artery  secondary to recurrent  and refractory upper GI (duodenal) bleed due to malignant invasion from patient's known pancreatic head carcinoma. 2. While the common and proper hepatic arteries remain irregularly narrowed throughout their course, I do not feel that these vessels are contributing to the patient's current hemodynamic instability and as such were not embolized.  PLAN: Pending the patient's wishes, would recommend continued aggressive conservative management. 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   Electronically Signed   By: Simonne Come M.D.   On: 02/18/2013 13:58   Ir Angiogram Visceral Selective  02/18/2013   CLINICAL DATA:  57 year old with metastatic pancreatic cancer. Acute and recurrent upper GI bleeding. The patient has had previous duodenal bleeding based on endoscopy.  EXAM: VISCERAL ARTERIOGRAPHY ; SELECTIVE VISCERAL ARTERIOGRAPHY ; ULTRASOUND GUIDANCE FOR VASCULAR ACCESS  Physician: Rachelle Hora. Henn, MD  MEDICATIONS: Versed 5 mg, fentanyl 100 mcg. A radiology nurse monitored the patient for moderate sedation.  ANESTHESIA/SEDATION: Moderate sedation time: 1 hr and 45 min  CONTRAST:  85 mL Omnipaque 300  FLUOROSCOPY TIME:  29 min and 30 seconds  PROCEDURE: The procedure was explained to the patient. The risks and benefits of the procedure were discussed and the patient's questions were addressed. Informed consent was obtained from the patient. Patient was brought to the interventional suite. The right groin was prepped and draped in sterile fashion. Maximal barrier sterile technique was  utilized including caps, mask, sterile gowns, sterile gloves, sterile drape, hand hygiene and skin antiseptic. The skin was anesthetized with 1% lidocaine. Using ultrasound guidance, a 21 gauge needle was directed into the right common femoral artery and a micropuncture dilator set was placed. A 5 French vascular sheath was placed over a Bentson wire. The superior mesenteric artery was cannulated with a C2 catheter. Superior mesenteric arteriogram was performed. The C2 catheter was used to cannulate the celiac trunk. Celiac arteriography was performed. A Glidewire was advanced into the common hepatic artery and the C2 catheter was advanced into the proximal common hepatic artery. At this point, there appeared to be extensive spasm in the common hepatic artery. C2 catheter was pulled back to the celiac trunk origin and additional angiography was performed. Multiple attempts were made to cannulate the common hepatic artery with a Renegade micro catheter. Cannulating the common hepatic artery was very difficult with the micro catheter. As result, the C2 catheter was exchanged for a Sos catheter. Eventually, a glide micro wire was advanced into the common hepatic artery and proper hepatic artery. The Renegade catheter was advanced to the common hepatic artery and additional angiography was performed. Catheter was also advanced into the proper hepatic artery and additional angiography was performed. Multiple attempts were made to cannulate the gastroduodenal artery with a wire and catheter. These attempts at cannulating the gastroduodenal artery were unsuccessful. The micro catheter and 5 French catheter were removed. Final angiogram was performed through the right groin sheath. The groin sheath was removed with an Exoseal closure device. There was groin hemostasis at the end of the procedure.  COMPLICATIONS: None  FINDINGS: Superior mesenteric arteriogram: Patient has a metallic biliary stent and IVC filter. Patient has  a large  replaced right hepatic artery that originates near the origin of the superior mesenteric artery. There are small pancreatic-duodenal branches but no evidence for a vascular malformation or contrast extravasation. The delayed images demonstrate patency of the superior mesenteric veins but there is marked attenuation and narrowing of the portal confluence and main portal vein. The intrahepatic portal venous system appears to be patent.  Celiac arteriography: The splenic artery is large and patent. There is a prominent left gastric artery with filling of the right gastric artery. There is mild narrowing and irregularity at the common hepatic artery and proper hepatic artery. There appears to be irregularity of the gastroduodenal artery. There is narrowing involving the origin of the right hepatic artery. There is no evidence for acute extravasation. After a wire was advanced into the common hepatic artery, there was marked narrowing of the common hepatic artery suggestive for vasospasm and minimal flow into the gastroduodenal artery. At one point, there appeared to be retrograde flow into the gastroduodenal artery. After the vasospasm resolved, the flow to the common hepatic artery and gastroduodenal artery returned to baseline.  IMPRESSION: Irregularity of the common hepatic artery, gastroduodenal artery and proper hepatic artery. These findings are related to the surrounding tumor burden. There was extensive vasospasm in the common hepatic artery during this procedure but no evidence for acute bleeding or contrast extravasation. The gastroduodenal artery could never be successfully cannulated and, therefore, an empiric gastroduodenal embolization could not be performed.  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   Electronically Signed   By: Richarda Overlie M.D.   On: 02/18/2013 10:29   Ir Angiogram Visceral Selective  02/18/2013   CLINICAL DATA:  57 year old with metastatic  pancreatic cancer. Acute and recurrent upper GI bleeding. The patient has had previous duodenal bleeding based on endoscopy.  EXAM: VISCERAL ARTERIOGRAPHY ; SELECTIVE VISCERAL ARTERIOGRAPHY ; ULTRASOUND GUIDANCE FOR VASCULAR ACCESS  Physician: Rachelle Hora. Henn, MD  MEDICATIONS: Versed 5 mg, fentanyl 100 mcg. A radiology nurse monitored the patient for moderate sedation.  ANESTHESIA/SEDATION: Moderate sedation time: 1 hr and 45 min  CONTRAST:  85 mL Omnipaque 300  FLUOROSCOPY TIME:  29 min and 30 seconds  PROCEDURE: The procedure was explained to the patient. The risks and benefits of the procedure were discussed and the patient's questions were addressed. Informed consent was obtained from the patient. Patient was brought to the interventional suite. The right groin was prepped and draped in sterile fashion. Maximal barrier sterile technique was utilized including caps, mask, sterile gowns, sterile gloves, sterile drape, hand hygiene and skin antiseptic. The skin was anesthetized with 1% lidocaine. Using ultrasound guidance, a 21 gauge needle was directed into the right common femoral artery and a micropuncture dilator set was placed. A 5 French vascular sheath was placed over a Bentson wire. The superior mesenteric artery was cannulated with a C2 catheter. Superior mesenteric arteriogram was performed. The C2 catheter was used to cannulate the celiac trunk. Celiac arteriography was performed. A Glidewire was advanced into the common hepatic artery and the C2 catheter was advanced into the proximal common hepatic artery. At this point, there appeared to be extensive spasm in the common hepatic artery. C2 catheter was pulled back to the celiac trunk origin and additional angiography was performed. Multiple attempts were made to cannulate the common hepatic artery with a Renegade micro catheter. Cannulating the common hepatic artery was very difficult with the micro catheter. As result, the C2 catheter was exchanged for a  Sos catheter. Eventually, a glide micro wire was advanced into the common hepatic artery and proper hepatic artery. The Renegade catheter was advanced to the common hepatic artery and additional angiography was performed. Catheter was also advanced into the proper hepatic artery and additional angiography was performed. Multiple attempts were made to cannulate the gastroduodenal artery with a wire and catheter. These attempts at cannulating the gastroduodenal artery were unsuccessful. The micro catheter and 5 French catheter were removed. Final angiogram was performed through the right groin sheath. The groin sheath was removed with an Exoseal closure device. There was groin hemostasis at the end of the procedure.  COMPLICATIONS: None  FINDINGS: Superior mesenteric arteriogram: Patient has a metallic biliary stent and IVC filter. Patient has a large replaced right hepatic artery that originates near the origin of the superior mesenteric artery. There are small pancreatic-duodenal branches but no evidence for a vascular malformation or contrast extravasation. The delayed images demonstrate patency of the superior mesenteric veins but there is marked attenuation and narrowing of the portal confluence and main portal vein. The intrahepatic portal venous system appears to be patent.  Celiac arteriography: The splenic artery is large and patent. There is a prominent left gastric artery with filling of the right gastric artery. There is mild narrowing and irregularity at the common hepatic artery and proper hepatic artery. There appears to be irregularity of the gastroduodenal artery. There is narrowing involving the origin of the right hepatic artery. There is no evidence for acute extravasation. After a wire was advanced into the common hepatic artery, there was marked narrowing of the common hepatic artery suggestive for vasospasm and minimal flow into the gastroduodenal artery. At one point, there appeared to be  retrograde flow into the gastroduodenal artery. After the vasospasm resolved, the flow to the common hepatic artery and gastroduodenal artery returned to baseline.  IMPRESSION: Irregularity of the common hepatic artery, gastroduodenal artery and proper hepatic artery. These findings are related to the surrounding tumor burden. There was extensive vasospasm in the common hepatic artery during this procedure but no evidence for acute bleeding or contrast extravasation. The gastroduodenal artery could never be successfully cannulated and, therefore, an empiric gastroduodenal embolization could not be performed.  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   Electronically Signed   By: Richarda Overlie M.D.   On: 02/18/2013 10:29   Ir Angiogram Selective Each Additional Vessel  02/18/2013   INDICATION: History of metastatic pancreatic cancer, now with acute and recurrent upper GI bleeding from the duodenum based on recent endoscopy. Post failed attempted endoscopic duodenal stent placement and mesenteric arteriogram  EXAM: 1.  CELIAC ARTERIOGRAM  2.  COMMON HEPATIC ARTERIOGRAM (3RD ORDER).  3. SUB SELECTIVE GASTRODUODENAL ARTERIOGRAM (3RD ORDER) WITH PARTICLE AND COIL EMBOLIZATION  4.  ULTRASOUND GUIDANCE FOR ARTERIAL ACCESS  MEDICATIONS: Nitroglycerin 100 mcg IA  TECHNIQUE: Informed written consent was obtained from the patient after a discussion of the risks, benefits and alternatives to treatment. Questions regarding the procedure were encouraged and answered. A timeout was performed prior to the initiation of the procedure.  The right groin was prepped and draped in the usual sterile fashion, and a sterile drape was applied covering the operative field. Maximum barrier sterile technique with sterile gowns and gloves were used for the procedure. A timeout was performed prior to the initiation of the procedure. Local anesthesia was provided with 1% lidocaine.  The right femoral head was  marked fluoroscopically. Under ultrasound guidance,  the right common femoral artery was accessed with a micropuncture kit after the overlying soft tissues were anesthetized with 1% lidocaine. An ultrasound image was saved for documentation purposes. The micropuncture sheath was exchanged for a 5 [place] vascular sheath over a Bentson wire. A closure arteriogram was performed through the side of the sheath confirming access within the right common femoral artery.  Over a Bentson wire, a Mickelson catheter was advanced to the level of the thoracic aorta where it was back bled and flushed. The catheter was then utilized to select the celiac artery and a celiac arteriogram was performed. 100 mcg of nitroglycerin was not administered intra-arterially through the celiac arterial vascular distribution.  With the use of a Fathom 14 microwire, a regular Renegade microcatheter was advanced into the common hepatic artery and a common hepatic arteriogram was performed. The microcatheter was then advanced into the distal aspect of the gastroduodenal artery and a sub selective gastroduodenal arteriogram was performed.  Initially, the distal tributaries of the GDA were embolized with 700-900 micron Embospheres. Limited post particle embolization arteriogram was performed. Subsequently, the gastroduodenal artery was coil embolized to near its origin with a combination of overlapping 2 and 3 mm diameter interlock coils.  The micro catheter was retracted into the common hepatic artery and a repeat common hepatic arteriogram was performed. The micro catheter was removed and a post embolization celiac arteriogram was performed. All images were reviewed and the procedure was terminated. All wires, catheters and sheaths were removed from the patient. Hemostasis was achieved at the right groin access site with manual compression.  ANESTHESIA/SEDATION: Fentanyl 200 mcg IV; Versed 4 mg IV  60 minutes  CONTRAST:  50 cc Omnipaque 300   COMPARISON:  None.  FLUOROSCOPY TIME:  11 min, 42 seconds  COMPLICATIONS: None immediate  FINDINGS: Celiac arteriogram demonstrates persistent irregular narrowing of both the common and proper hepatic arteries extending preferentially involve the origin of the right hepatic artery.  The gastroduodenal artery remains patent though irregularly narrowed throughout its course.  The gastroduodenal artery was successfully cannulated and embolized with a combination of 700 to 900 micron Embospheres and overlapping interlock coils to near its origin. 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  IMPRESSION: 1. Successful prophylactic particle and coil embolization of the gastroduodenal artery secondary to recurrent and refractory upper GI (duodenal) bleed due to malignant invasion from patient's known pancreatic head carcinoma. 2. While the common and proper hepatic arteries remain irregularly narrowed throughout their course, I do not feel that these vessels are contributing to the patient's current hemodynamic instability and as such were not embolized.  PLAN: Pending the patient's wishes, would recommend continued aggressive conservative management. 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   Electronically Signed   By: Simonne Come M.D.   On: 02/18/2013 13:58   Ir Angiogram Selective Each Additional Vessel  02/18/2013   CLINICAL DATA:  57 year old with metastatic pancreatic cancer. Acute and recurrent upper GI bleeding. The patient has had previous duodenal  bleeding based on endoscopy.  EXAM: VISCERAL ARTERIOGRAPHY ; SELECTIVE VISCERAL ARTERIOGRAPHY ; ULTRASOUND GUIDANCE FOR VASCULAR ACCESS  Physician: Rachelle Hora. Henn, MD  MEDICATIONS: Versed 5 mg, fentanyl 100 mcg. A radiology nurse monitored the patient for moderate sedation.  ANESTHESIA/SEDATION: Moderate sedation time: 1 hr and 45 min  CONTRAST:  85 mL Omnipaque 300  FLUOROSCOPY TIME:  29 min and 30 seconds  PROCEDURE: The procedure was explained to the patient. The risks and benefits of the procedure were discussed and the patient's questions were addressed. Informed consent was obtained from the patient. Patient was brought to the interventional suite. The right groin was prepped and draped in sterile fashion. Maximal barrier sterile technique was utilized including caps, mask, sterile gowns, sterile gloves, sterile drape, hand hygiene and skin antiseptic. The skin was anesthetized with 1% lidocaine. Using ultrasound guidance, a 21 gauge needle was directed into the right common femoral artery and a micropuncture dilator set was placed. A 5 French vascular sheath was placed over a Bentson wire. The superior mesenteric artery was cannulated with a C2 catheter. Superior mesenteric arteriogram was performed. The C2 catheter was used to cannulate the celiac trunk. Celiac arteriography was performed. A Glidewire was advanced into the common hepatic artery and the C2 catheter was advanced into the proximal common hepatic artery. At this point, there appeared to be extensive spasm in the common hepatic artery. C2 catheter was pulled back to the celiac trunk origin and additional angiography was performed. Multiple attempts were made to cannulate the common hepatic artery with a Renegade micro catheter. Cannulating the common hepatic artery was very difficult with the micro catheter. As result, the C2 catheter was exchanged for a Sos catheter. Eventually, a glide micro wire was advanced into the common hepatic artery and  proper hepatic artery. The Renegade catheter was advanced to the common hepatic artery and additional angiography was performed. Catheter was also advanced into the proper hepatic artery and additional angiography was performed. Multiple attempts were made to cannulate the gastroduodenal artery with a wire and catheter. These attempts at cannulating the gastroduodenal artery were unsuccessful. The micro catheter and 5 French catheter were removed. Final angiogram was performed through the right groin sheath. The groin sheath was removed with an Exoseal closure device. There was groin hemostasis at the end of the procedure.  COMPLICATIONS: None  FINDINGS: Superior mesenteric arteriogram: Patient has a metallic biliary stent and IVC filter. Patient has a large replaced right hepatic artery that originates near the origin of the superior mesenteric artery. There are small pancreatic-duodenal branches but no evidence for a vascular malformation or contrast extravasation. The delayed images demonstrate patency of the superior mesenteric veins but there is marked attenuation and narrowing of the portal confluence and main portal vein. The intrahepatic portal venous system appears to be patent.  Celiac arteriography: The splenic artery is large and patent. There is a prominent left gastric artery with filling of the right gastric artery. There is mild narrowing and irregularity at the common hepatic artery and proper hepatic artery. There appears to be irregularity of the gastroduodenal artery. There is narrowing involving the origin of the right hepatic artery. There is no evidence for acute extravasation. After a wire was advanced into the common hepatic artery, there was marked narrowing of the common hepatic artery suggestive for vasospasm and minimal flow into the gastroduodenal artery. At one point, there appeared to be retrograde flow into the gastroduodenal artery. After the vasospasm resolved, the flow to the  common hepatic artery and gastroduodenal artery returned to baseline.  IMPRESSION: Irregularity of the common hepatic artery, gastroduodenal artery  and proper hepatic artery. These findings are related to the surrounding tumor burden. There was extensive vasospasm in the common hepatic artery during this procedure but no evidence for acute bleeding or contrast extravasation. The gastroduodenal artery could never be successfully cannulated and, therefore, an empiric gastroduodenal embolization could not be performed.  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   Electronically Signed   By: Richarda Overlie M.D.   On: 02/18/2013 10:29   Ir Angiogram Selective Each Additional Vessel  02/18/2013   CLINICAL DATA:  57 year old with metastatic pancreatic cancer. Acute and recurrent upper GI bleeding. The patient has had previous duodenal bleeding based on endoscopy.  EXAM: VISCERAL ARTERIOGRAPHY ; SELECTIVE VISCERAL ARTERIOGRAPHY ; ULTRASOUND GUIDANCE FOR VASCULAR ACCESS  Physician: Rachelle Hora. Henn, MD  MEDICATIONS: Versed 5 mg, fentanyl 100 mcg. A radiology nurse monitored the patient for moderate sedation.  ANESTHESIA/SEDATION: Moderate sedation time: 1 hr and 45 min  CONTRAST:  85 mL Omnipaque 300  FLUOROSCOPY TIME:  29 min and 30 seconds  PROCEDURE: The procedure was explained to the patient. The risks and benefits of the procedure were discussed and the patient's questions were addressed. Informed consent was obtained from the patient. Patient was brought to the interventional suite. The right groin was prepped and draped in sterile fashion. Maximal barrier sterile technique was utilized including caps, mask, sterile gowns, sterile gloves, sterile drape, hand hygiene and skin antiseptic. The skin was anesthetized with 1% lidocaine. Using ultrasound guidance, a 21 gauge needle was directed into the right common femoral artery and a micropuncture dilator set was placed. A 5 French vascular  sheath was placed over a Bentson wire. The superior mesenteric artery was cannulated with a C2 catheter. Superior mesenteric arteriogram was performed. The C2 catheter was used to cannulate the celiac trunk. Celiac arteriography was performed. A Glidewire was advanced into the common hepatic artery and the C2 catheter was advanced into the proximal common hepatic artery. At this point, there appeared to be extensive spasm in the common hepatic artery. C2 catheter was pulled back to the celiac trunk origin and additional angiography was performed. Multiple attempts were made to cannulate the common hepatic artery with a Renegade micro catheter. Cannulating the common hepatic artery was very difficult with the micro catheter. As result, the C2 catheter was exchanged for a Sos catheter. Eventually, a glide micro wire was advanced into the common hepatic artery and proper hepatic artery. The Renegade catheter was advanced to the common hepatic artery and additional angiography was performed. Catheter was also advanced into the proper hepatic artery and additional angiography was performed. Multiple attempts were made to cannulate the gastroduodenal artery with a wire and catheter. These attempts at cannulating the gastroduodenal artery were unsuccessful. The micro catheter and 5 French catheter were removed. Final angiogram was performed through the right groin sheath. The groin sheath was removed with an Exoseal closure device. There was groin hemostasis at the end of the procedure.  COMPLICATIONS: None  FINDINGS: Superior mesenteric arteriogram: Patient has a metallic biliary stent and IVC filter. Patient has a large replaced right hepatic artery that originates near the origin of the superior mesenteric artery. There are small pancreatic-duodenal branches but no evidence for a vascular malformation or contrast extravasation. The delayed images demonstrate patency of the superior mesenteric veins but there is marked  attenuation and narrowing of the portal confluence and main portal vein. The intrahepatic portal venous system appears to be patent.  Celiac arteriography:  The splenic artery is large and patent. There is a prominent left gastric artery with filling of the right gastric artery. There is mild narrowing and irregularity at the common hepatic artery and proper hepatic artery. There appears to be irregularity of the gastroduodenal artery. There is narrowing involving the origin of the right hepatic artery. There is no evidence for acute extravasation. After a wire was advanced into the common hepatic artery, there was marked narrowing of the common hepatic artery suggestive for vasospasm and minimal flow into the gastroduodenal artery. At one point, there appeared to be retrograde flow into the gastroduodenal artery. After the vasospasm resolved, the flow to the common hepatic artery and gastroduodenal artery returned to baseline.  IMPRESSION: Irregularity of the common hepatic artery, gastroduodenal artery and proper hepatic artery. These findings are related to the surrounding tumor burden. There was extensive vasospasm in the common hepatic artery during this procedure but no evidence for acute bleeding or contrast extravasation. The gastroduodenal artery could never be successfully cannulated and, therefore, an empiric gastroduodenal embolization could not be performed.  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   Electronically Signed   By: Richarda Overlie M.D.   On: 02/18/2013 10:29   Ir Ivc Filter Plmt / S&i /img Guid/mod Sed  02/11/2013   CLINICAL DATA:  Metastatic pancreatic carcinoma. History of pulmonary embolism. GI bleeding, a relative contraindication to anticoagulation.  EXAM: INFERIOR VENACAVOGRAM  IVC FILTER PLACEMENT UNDER FLUOROSCOPY  TECHNIQUE: The procedure, risks (including but not limited to bleeding, infection, organ damage ), benefits, and alternatives were  explained to the patient. Questions regarding the procedure were encouraged and answered. The patient understands and consents to the procedure. Caval anatomy reviewed on previous CT abdomen. Patency of the right IJ vein was confirmed with ultrasound with image documentation. An appropriate skin site was determined. Skin site was marked, prepped with chlorhexidine, and draped using maximum barrier technique. The region was infiltrated locally with 1% lidocaine.  Intravenous Fentanyl and Versed were administered as conscious sedation during continuous cardiorespiratory monitoring by the radiology RN, with a total moderate sedation time of less than 30 minutes.  Under real-time ultrasound guidance, the right IJ vein was accessed with a 21 gauge micropuncture needle; the needle tip within the vein was confirmed with ultrasound image documentation. The needle was exchanged over a 018 guidewire for a transitional dilator, which allow advancement of the [facility] wire into the IVC. A long 6 French vascular sheath was placed for inferior venacavography. This demonstrated no caval thrombus. Renal vein inflows were evident.  The [facility] IVC filter was advanced through the sheath and successfully deployed under fluoroscopy at the L2-3 disc level. Followup cavagram demonstrates stable filter position and no evident complication. The sheath was removed and hemostasis achieved at the site. No immediate complication.  FLUOROSCOPY TIME:  36 seconds  IMPRESSION: 1. Normal IVC. No thrombus or significant anatomic variation. 2. Technically successful infrarenal IVC filter placement. This is a retrievable model.   Electronically Signed   By: Oley Balm M.D.   On: 02/11/2013 13:43   Ir US Guide Vasc Access Right  02/18/2013   INDICATION: History of metastatic pancreatic cancer, now with acute and recurrent upper GI bleeding from the duodenum based on recent endoscopy. Post failed attempted endoscopic duodenal stent placement and  mesenteric arteriogram  EXAM: 1.  CELIAC ARTERIOGRAM  2.  COMMON HEPATIC ARTERIOGRAM (3RD ORDER).  3. SUB SELECTIVE GASTRODUODENAL ARTERIOGRAM (3RD ORDER) WITH PARTICLE AND  COIL EMBOLIZATION  4.  ULTRASOUND GUIDANCE FOR ARTERIAL ACCESS  MEDICATIONS: Nitroglycerin 100 mcg IA  TECHNIQUE: Informed written consent was obtained from the patient after a discussion of the risks, benefits and alternatives to treatment. Questions regarding the procedure were encouraged and answered. A timeout was performed prior to the initiation of the procedure.  The right groin was prepped and draped in the usual sterile fashion, and a sterile drape was applied covering the operative field. Maximum barrier sterile technique with sterile gowns and gloves were used for the procedure. A timeout was performed prior to the initiation of the procedure. Local anesthesia was provided with 1% lidocaine.  The right femoral head was marked fluoroscopically. Under ultrasound guidance, the right common femoral artery was accessed with a micropuncture kit after the overlying soft tissues were anesthetized with 1% lidocaine. An ultrasound image was saved for documentation purposes. The micropuncture sheath was exchanged for a 5 [place] vascular sheath over a Bentson wire. A closure arteriogram was performed through the side of the sheath confirming access within the right common femoral artery.  Over a Bentson wire, a Mickelson catheter was advanced to the level of the thoracic aorta where it was back bled and flushed. The catheter was then utilized to select the celiac artery and a celiac arteriogram was performed. 100 mcg of nitroglycerin was not administered intra-arterially through the celiac arterial vascular distribution.  With the use of a Fathom 14 microwire, a regular Renegade microcatheter was advanced into the common hepatic artery and a common hepatic arteriogram was performed. The microcatheter was then advanced into the distal aspect of the  gastroduodenal artery and a sub selective gastroduodenal arteriogram was performed.  Initially, the distal tributaries of the GDA were embolized with 700-900 micron Embospheres. Limited post particle embolization arteriogram was performed. Subsequently, the gastroduodenal artery was coil embolized to near its origin with a combination of overlapping 2 and 3 mm diameter interlock coils.  The micro catheter was retracted into the common hepatic artery and a repeat common hepatic arteriogram was performed. The micro catheter was removed and a post embolization celiac arteriogram was performed. All images were reviewed and the procedure was terminated. All wires, catheters and sheaths were removed from the patient. Hemostasis was achieved at the right groin access site with manual compression.  ANESTHESIA/SEDATION: Fentanyl 200 mcg IV; Versed 4 mg IV  60 minutes  CONTRAST:  50 cc Omnipaque 300  COMPARISON:  None.  FLUOROSCOPY TIME:  11 min, 42 seconds  COMPLICATIONS: None immediate  FINDINGS: Celiac arteriogram demonstrates persistent irregular narrowing of both the common and proper hepatic arteries extending preferentially involve the origin of the right hepatic artery.  The gastroduodenal artery remains patent though irregularly narrowed throughout its course.  The gastroduodenal artery was successfully cannulated and embolized with a combination of 700 to 900 micron Embospheres and overlapping interlock coils to near its origin. 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  IMPRESSION: 1. Successful prophylactic particle and coil embolization of the gastroduodenal artery secondary to recurrent and refractory upper GI (duodenal) bleed due to malignant invasion from patient's known pancreatic head carcinoma. 2. While the common and proper hepatic arteries remain irregularly narrowed throughout their course, I do not feel that these vessels are contributing to the  patient's current hemodynamic instability and as such were not embolized.  PLAN: Pending the patient's wishes, would recommend continued aggressive conservative management. 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   Electronically Signed   By: Simonne Come M.D.   On: 02/18/2013 13:58   Ir US Guide Vasc Access Right  02/18/2013   CLINICAL DATA:  57 year old with metastatic pancreatic cancer. Acute and recurrent upper GI bleeding. The patient has had previous duodenal bleeding based on endoscopy.  EXAM: VISCERAL ARTERIOGRAPHY ; SELECTIVE VISCERAL ARTERIOGRAPHY ; ULTRASOUND GUIDANCE FOR VASCULAR ACCESS  Physician: Rachelle Hora. Henn, MD  MEDICATIONS: Versed 5 mg, fentanyl 100 mcg. A radiology nurse monitored the patient for moderate sedation.  ANESTHESIA/SEDATION: Moderate sedation time: 1 hr and 45 min  CONTRAST:  85 mL Omnipaque 300  FLUOROSCOPY TIME:  29 min and 30 seconds  PROCEDURE: The procedure was explained to the patient. The risks and benefits of the procedure were discussed and the patient's questions were addressed. Informed consent was obtained from the patient. Patient was brought to the interventional suite. The right groin was prepped and draped in sterile fashion. Maximal barrier sterile technique was utilized including caps, mask, sterile gowns, sterile gloves, sterile drape, hand hygiene and skin antiseptic. The skin was anesthetized with 1% lidocaine. Using ultrasound guidance, a 21 gauge needle was directed into the right common femoral artery and a micropuncture dilator set was placed. A 5  French vascular sheath was placed over a Bentson wire. The superior mesenteric artery was cannulated with a C2 catheter. Superior mesenteric arteriogram was performed. The C2 catheter was used to cannulate the celiac trunk. Celiac arteriography was performed. A Glidewire was advanced into the common hepatic artery and the C2 catheter was advanced into the proximal common hepatic artery. At this point, there appeared to be extensive spasm in the common hepatic artery. C2 catheter was pulled back to the celiac trunk origin and additional angiography was performed. Multiple attempts were made to cannulate the common hepatic artery with a Renegade micro catheter. Cannulating the common hepatic artery was very difficult with the micro catheter. As result, the C2 catheter was exchanged for a Sos catheter. Eventually, a glide micro wire was advanced into the common hepatic artery and proper hepatic artery. The Renegade catheter was advanced to the common hepatic artery and additional angiography was performed. Catheter was also advanced into the proper hepatic artery and additional angiography was performed. Multiple attempts were made to cannulate the gastroduodenal artery with a wire and catheter. These attempts at cannulating the gastroduodenal artery were unsuccessful. The micro catheter and 5 French catheter were removed. Final angiogram was performed through the right groin sheath. The groin sheath was removed with an Exoseal closure device. There was groin hemostasis at the end of the procedure.  COMPLICATIONS: None  FINDINGS: Superior mesenteric arteriogram: Patient has a metallic biliary stent and IVC filter. Patient has a large replaced right hepatic artery that originates near the origin of the superior mesenteric artery. There are small pancreatic-duodenal branches but no evidence for a vascular malformation or contrast extravasation. The delayed images demonstrate patency of the superior mesenteric veins but  there is marked attenuation and narrowing of the portal confluence and main portal vein. The intrahepatic portal venous system appears to be patent.  Celiac arteriography: The splenic artery is large and patent. There is a  prominent left gastric artery with filling of the right gastric artery. There is mild narrowing and irregularity at the common hepatic artery and proper hepatic artery. There appears to be irregularity of the gastroduodenal artery. There is narrowing involving the origin of the right hepatic artery. There is no evidence for acute extravasation. After a wire was advanced into the common hepatic artery, there was marked narrowing of the common hepatic artery suggestive for vasospasm and minimal flow into the gastroduodenal artery. At one point, there appeared to be retrograde flow into the gastroduodenal artery. After the vasospasm resolved, the flow to the common hepatic artery and gastroduodenal artery returned to baseline.  IMPRESSION: Irregularity of the common hepatic artery, gastroduodenal artery and proper hepatic artery. These findings are related to the surrounding tumor burden. There was extensive vasospasm in the common hepatic artery during this procedure but no evidence for acute bleeding or contrast extravasation. The gastroduodenal artery could never be successfully cannulated and, therefore, an empiric gastroduodenal embolization could not be performed.  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   Electronically Signed   By: Richarda Overlie M.D.   On: 02/18/2013 10:29   Dg C-arm 1-60 Min-no Report  02/16/2013   CLINICAL DATA: stent placement   C-ARM 1-60 MINUTES  Fluoroscopy was utilized by the requesting physician.  No radiographic  interpretation.    Ir Rebekah [place] [facility] [employer] Guide Roadmapping  02/18/2013   INDICATION: History of metastatic pancreatic cancer, now with acute and recurrent upper GI bleeding from the duodenum  based on recent endoscopy. Post failed attempted endoscopic duodenal stent placement and mesenteric arteriogram  EXAM: 1.  CELIAC ARTERIOGRAM  2.  COMMON HEPATIC ARTERIOGRAM (3RD ORDER).  3. SUB SELECTIVE GASTRODUODENAL ARTERIOGRAM (3RD ORDER) WITH PARTICLE AND COIL EMBOLIZATION  4.  ULTRASOUND GUIDANCE FOR ARTERIAL ACCESS  MEDICATIONS: Nitroglycerin 100 mcg IA  TECHNIQUE: Informed written consent was obtained from the patient after a discussion of the risks, benefits and alternatives to treatment. Questions regarding the procedure were encouraged and answered. A timeout was performed prior to the initiation of the procedure.  The right groin was prepped and draped in the usual sterile fashion, and a sterile drape was applied covering the operative field. Maximum barrier sterile technique with sterile gowns and gloves were used for the procedure. A timeout was performed prior to the initiation of the procedure. Local anesthesia was provided with 1% lidocaine.  The right femoral head was marked fluoroscopically. Under ultrasound guidance, the right common femoral artery was accessed with a micropuncture kit after the overlying soft tissues were anesthetized with 1% lidocaine. An ultrasound image was saved for documentation purposes. The micropuncture sheath was exchanged for a 5 [place] vascular sheath over a Bentson wire. A closure arteriogram was performed through the side of the sheath confirming access within the right common femoral artery.  Over a Bentson wire, a Mickelson catheter was advanced to the level of the thoracic aorta where it was back bled and flushed. The catheter was then utilized to select the celiac artery and a celiac arteriogram was performed. 100 mcg of nitroglycerin was not administered intra-arterially through the celiac arterial vascular distribution.  With the use of a Fathom 14 microwire, a regular Renegade microcatheter was advanced into the common hepatic artery and a common hepatic  arteriogram was performed. The microcatheter was then advanced into the distal aspect of the gastroduodenal artery and a sub selective gastroduodenal arteriogram was performed.  Initially, the distal tributaries of the GDA were embolized with 700-900 micron Embospheres. Limited post particle embolization arteriogram was performed. Subsequently, the gastroduodenal artery was coil embolized to near its origin with a combination of overlapping 2 and 3 mm diameter interlock coils.  The micro catheter was retracted into the common hepatic artery and a repeat common hepatic arteriogram was performed. The micro catheter was removed and a post embolization celiac arteriogram was performed. All images were reviewed and the procedure was terminated. All wires, catheters and sheaths were removed from the patient. Hemostasis was achieved at the right groin access site with manual compression.  ANESTHESIA/SEDATION: Fentanyl 200 mcg IV; Versed 4 mg IV  60 minutes  CONTRAST:  50 cc Omnipaque 300  COMPARISON:  None.  FLUOROSCOPY TIME:  11 min, 42 seconds  COMPLICATIONS: None immediate  FINDINGS: Celiac arteriogram demonstrates persistent irregular narrowing of both the common and proper hepatic arteries extending preferentially involve the origin of the right hepatic artery.  The gastroduodenal artery remains patent though irregularly narrowed throughout its course.  The gastroduodenal artery was successfully cannulated and embolized with a combination of 700 to 900 micron Embospheres and overlapping interlock coils to near its origin. 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  IMPRESSION: 1. Successful prophylactic particle and coil embolization of the gastroduodenal artery secondary to recurrent and refractory upper GI (duodenal) bleed due to malignant invasion from patient's known pancreatic head carcinoma. 2. While the common and proper hepatic arteries remain irregularly  narrowed throughout their course, I do not feel that these vessels are contributing to the patient's current hemodynamic instability and as such were not embolized.  PLAN: Pending the patient's wishes, would recommend continued aggressive conservative management. 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   Electronically Signed   By: Simonne Come M.D.   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 CT angiography is performed to assess vasculature prior to potential arteriography and transcatheter embolization to treat persistent bleeding.  EXAM: CT ANGIOGRAPHY ABDOMEN AND PELVIS WITH CONTRAST AND WITHOUT CONTRAST  TECHNIQUE: Multidetector CT imaging of the abdomen and pelvis was performed using the standard protocol during bolus administration of intravenous contrast. Multiplanar reconstructed images including MIPs were obtained and reviewed to evaluate the vascular anatomy.  CONTRAST:  OMNIPAQUE IOHEXOL 350 MG/ML SOLN  COMPARISON:  12/23/2012  FINDINGS: There is significant enlargement of the mass involving the head of the pancreas since the prior CT in October. Dimensions of the mass are now approximately 3.7 x 5.6 x 4.5 cm. The mass visibly encases the pre-existing biliary stent and also extends further to encase the gastroduodenal artery, portal venous confluence, hepatic artery and a segment of the superior mesenteric vein. 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 Portal flow is present in the right lobe.  Significant progression of metastatic disease in the liver  is also identified in both left and right lobes. Index lesion in the inferior right lobe shows enlargement with dimensions of 2.5 x 3.5 cm (1.6 x 2.1 cm previously). The largest left lobe lesion has enlarged from a maximum diameter of 2.4 cm previously to estimated current maximum diameter of 4.0 cm. All previously identified metastatic lesions have increased significantly in size. There are at least 4 new metastatic lesions identified in the liver. There is no evidence of associated biliary obstruction with air remaining a biliary tree. The common bile duct stent lumen does not appear to be compromised.  There is new ascites with mild to moderate overall volume of ascites present as well as nodularity of the mesenteric and omentum suspicious for carcinomatosis. No bowel obstruction or perforation is identified.  Arterial vasculature evaluated by CTA shows no evidence of significant occlusive disease. The aorta and major visceral branches are normally patent. At the level of the celiac trunk, the common hepatic artery is narrowed and encased by tumor but remains open with flow noted in both right and left hepatic arteries. The gastroduodenal artery is open but encased by tumor. Small distal GDA branches are present supplying the pancreatic mass. There also appears to be pancreaticoduodenal supply from the SMA at the level of the pancreatic mass.  The superior mesenteric artery abuts tumor but is not visibly narrowed. Focal nonocclusive thrombus or direct tumor invasion is identified in the superior aspect of the superior mesenteric vein causing luminal stenosis of approximately 60%. The splenic vein remains open.  Bilateral renal artery show normal patency. The inferior  mesenteric vein is open. Bilateral iliac and common femoral arteries show normal patency.  Appearance of the gallbladder and kidneys are unremarkable. The spleen shows a new focal wedge-shaped infarct in its posterior and inferior aspect. The rest of the spleen shows normal perfusion. There is a stable left adrenal nodule. Bony structures show degenerative changes of the spine without evidence of focal lesions.  Review of the MIP images confirms the above findings.  IMPRESSION: 1. Significant progression of metastatic pancreatic carcinoma with enlargement of the mass at the level of the head of the pancreas and progression of hepatic metastatic disease. 2. 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 Tumor also encases and narrows the common hepatic artery, right and left hepatic arteries and gastroduodenal artery. There also is tumor involvement at the level of the superior mesenteric vein. 3. 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   Electronically Signed   By: Irish Lack M.D.   On: 02/11/2013 10:34         Subjective: Patient denies fevers, chills, chest discomfort, shortness breath, nausea, vomiting, diarrhea, abdominal pain, dizziness.  Objective: Filed Vitals:   02/18/13 1615 02/18/13 1630 02/18/13 1645 02/18/13 1700  BP: 141/85     Pulse:      Temp:    98.7 F (37.1 C)  TempSrc:    Oral  Resp: 17 18 20    Height:      Weight:      SpO2: 100% 100% 100%     Intake/Output Summary (Last 24 hours) at 02/18/13 1804 Last data filed at 02/18/13 1614  Gross per 24 hour  Intake   3500 ml  Output    575 ml  Net   2925 ml   Weight change:  Exam:   General:  Pt is alert, follows commands  appropriately, not in acute distress  HEENT: No icterus, No thrush,  [place]/AT  Cardiovascular: RRR, S1/S2, no rubs, no gallops  Respiratory: CTA bilaterally, no  wheezing, no crackles, no rhonchi  Abdomen: Soft/+BS, non tender, non distended, no guarding  Extremities: 1+ edema, No lymphangitis, No petechiae, No rashes, no synovitis  Data Reviewed: Basic Metabolic Panel:  Recent Labs Lab 02/12/13 0445 02/13/13 0500 02/14/13 0500 02/15/13 1036 02/17/13 1903 02/18/13 0130  NA 135 131* 135 133* 134* 139  K 3.3* 3.7 3.4* 3.8 3.4* 3.6  CL 105 104 107 104 106 110  CO2 21 19 21  18* 21 22  GLUCOSE 105* 123* 124* 135* 138* 106*  BUN 13 12 13 14 13 12   CREATININE 0.88 0.88 0.90 0.89 0.74 0.77  CALCIUM 8.4 8.2* 8.4 8.1* 8.1* 8.6  MG 1.8 1.8 1.9  --   --   --    Liver Function Tests:  Recent Labs Lab 02/12/13 0445 02/13/13 0500 02/14/13 0500 02/15/13 1036  AST 27 25 22 25   ALT 22 21 22 21   ALKPHOS 87 76 85 81  BILITOT 0.4 0.3 0.5 0.4  PROT 5.1* 4.6* 5.1* 5.0*  ALBUMIN 2.2* 1.9* 2.3* 2.2*   No results found for this basename: LIPASE, AMYLASE,  in the last 168 hours No results found for this basename: AMMONIA,  in the last 168 hours CBC:  Recent Labs Lab 02/12/13 0445  02/13/13 0500 02/14/13 0500  02/17/13 0805 02/17/13 1640 02/18/13 0130 02/18/13 0810 02/18/13 1439  WBC 5.2  --  5.6 6.8  < > 8.1 13.2* 9.1 7.3 7.7  NEUTROABS 3.9  --  4.2 5.1  --   --   --   --   --   --   HGB 9.1*  < > 8.0* 9.0*  < > 7.4* 8.8* 8.2* 7.6* 8.6*  HCT 26.9*  < > 22.9* 26.3*  < > 21.2* 25.4* 24.1* 22.2* 25.3*  MCV 86.2  --  85.8 86.2  < > 88.0 87.3 88.0 88.1 88.5  PLT 54*  --  49* 70*  < > 93* 135* PLATELET CLUMPS NOTED ON SMEAR, COUNT APPEARS ADEQUATE 85* 88*  < > = values in this interval not displayed. Cardiac Enzymes: No results found for this basename: CKTOTAL, CKMB, CKMBINDEX, TROPONINI,  in the last 168 hours BNP: No components found with this basename: POCBNP,  CBG: No results found for this basename: GLUCAP,  in the last 168 hours  Recent Results (from the past 240 hour(s))  CLOSTRIDIUM DIFFICILE BY PCR     Status: None   Collection  Time    02/13/13  1:10 PM      Result Value Range Status   C difficile by pcr NEGATIVE  NEGATIVE Final   Comment: Performed at [facility]     Scheduled Meds: . folic acid  2 mg Oral Daily  . lidocaine      . lipase/protease/amylase  2 capsule Oral TID AC & HS  . pantoprazole (PROTONIX) IV  40 mg Intravenous Q24H  . phytonadione  10 mg Subcutaneous Daily  . sodium chloride  3 mL Intravenous Q12H  . sucralfate  2 g Oral Q6H   Continuous Infusions: . sodium chloride Stopped (02/17/13 1915)  . sodium chloride 150 mL/hr at 02/17/13 2154     Timm Bonenberger, DO  [facility] Pager [phone]  If 7PM-7AM, please contact night-coverage [email or web address] Password [email or web address] 02/18/2013, 6:04 PM   LOS: 3 days

## 2013-02-18 NOTE — ED Notes (Signed)
Austin Escobar, RT taking over groin management from Dr. Grace Isaac.  Manual pressure being applied.  Groin level 0.

## 2013-02-18 NOTE — ED Notes (Signed)
5 Fr sheath removed from RFA by Dr. Grace Isaac.  Hemostasis achieved using manual pressure.  R groin level 0, 2-3+ RDP.

## 2013-02-18 NOTE — Progress Notes (Signed)
[facility] Radiation Oncology Dept Therapy Treatment Record Phone [phone]   Radiation Therapy was administered to Austin Escobar on: 02/18/2013  5:10 PM and was treatment # 5 out of a planned course of 10 treatments.

## 2013-02-19 ENCOUNTER — Ambulatory Visit
Admit: 2013-02-19 | Discharge: 2013-02-19 | Disposition: A | Discharge status: Home / Self Care | Payer: BC Managed Care – PPO | Attending: Radiation Oncology | Admitting: Radiation Oncology

## 2013-02-19 LAB — TYPE AND SCREEN
Antibody Screen: NEGATIVE
Unit division: 0
Unit division: 0

## 2013-02-19 LAB — CBC
Hemoglobin: 9.2 g/dL — ABNORMAL LOW (ref 13.0–17.0)
MCH: 30 pg (ref 26.0–34.0)
MCHC: 34.1 g/dL (ref 30.0–36.0)
MCHC: 34.4 g/dL (ref 30.0–36.0)
Platelets: 85 10*3/uL — ABNORMAL LOW (ref 150–400)
Platelets: 84 10*3/uL — ABNORMAL LOW (ref 150–400)
RBC: 3.14 MIL/uL — ABNORMAL LOW (ref 4.22–5.81)
RDW: 17.3 % — ABNORMAL HIGH (ref 11.5–15.5)
WBC: 8.6 10*3/uL (ref 4.0–10.5)

## 2013-02-19 NOTE — Progress Notes (Signed)
[facility] Radiation Oncology Dept Therapy Treatment Record Phone [phone]   Radiation Therapy was administered to Austin Escobar on: 02/19/2013  2:59 PM and was treatment # 6 out of a planned course of 10 treatments.

## 2013-02-19 NOTE — Progress Notes (Signed)
3 Days Post-Op  Subjective: Pt doing ok today; has not had any further bleeding since angio 12/18; would like to eat  Objective: Vital signs in last 24 hours: Temp:  [97.6 F (36.4 C)-98.9 F (37.2 C)] 97.6 F (36.4 C) (12/19 0800) Pulse Rate:  [82-97] 82 (12/18 1459) Resp:  [9-25] 12 (12/19 0800) BP: (103-152)/(66-97) 130/87 mmHg (12/19 0800) SpO2:  [96 %-100 %] 98 % (12/19 0800) Weight:  [212 lb 4.9 oz (96.3 kg)] 212 lb 4.9 oz (96.3 kg) (12/19 0520) Last BM Date: 02/18/13  Intake/Output from previous day: 12/18 0701 - 12/19 0700 In: 4317.5 [P.O.:480; I.V.:3500; Blood:337.5] Out: -  Intake/Output this shift:    Pt awake/alert; chest- CTA bilat; heart- RRR; abd- mildly dist, NT,+BS; rt CFA puncture site clean and dry,NT, no hematoma; intact distal pulses  Lab Results:   Recent Labs  02/19/13 0300 02/19/13 0800  WBC 8.7 8.6  HGB 9.2* 9.5*  HCT 27.0* 27.6*  PLT 85* 84*   BMET  Recent Labs  02/17/13 1903 02/18/13 0130  NA 134* 139  K 3.4* 3.6  CL 106 110  CO2 21 22  GLUCOSE 138* 106*  BUN 13 12  CREATININE 0.74 0.77  CALCIUM 8.1* 8.6   PT/INR  Recent Labs  02/17/13 1903 02/18/13 0130  LABPROT 21.3* 19.4*  INR 1.91* 1.69*   ABG No results found for this basename: PHART, PCO2, PO2, HCO3,  in the last 72 hours  Studies/Results: Ir Angiogram Visceral Selective  02/18/2013   INDICATION: History of metastatic pancreatic cancer, now with acute and recurrent upper GI bleeding from the duodenum based on recent endoscopy. Post failed attempted endoscopic duodenal stent placement and mesenteric arteriogram  EXAM: 1.  CELIAC ARTERIOGRAM  2.  COMMON HEPATIC ARTERIOGRAM (3RD ORDER).  3. SUB SELECTIVE GASTRODUODENAL ARTERIOGRAM (3RD ORDER) WITH PARTICLE AND COIL EMBOLIZATION  4.  ULTRASOUND GUIDANCE FOR ARTERIAL ACCESS  MEDICATIONS: Nitroglycerin 100 mcg IA  TECHNIQUE: Informed written consent was obtained from the patient after a discussion of the risks, benefits and  alternatives to treatment. Questions regarding the procedure were encouraged and answered. A timeout was performed prior to the initiation of the procedure.  The right groin was prepped and draped in the usual sterile fashion, and a sterile drape was applied covering the operative field. Maximum barrier sterile technique with sterile gowns and gloves were used for the procedure. A timeout was performed prior to the initiation of the procedure. Local anesthesia was provided with 1% lidocaine.  The right femoral head was marked fluoroscopically. Under ultrasound guidance, the right common femoral artery was accessed with a micropuncture kit after the overlying soft tissues were anesthetized with 1% lidocaine. An ultrasound image was saved for documentation purposes. The micropuncture sheath was exchanged for a 5 [place] vascular sheath over a Bentson wire. A closure arteriogram was performed through the side of the sheath confirming access within the right common femoral artery.  Over a Bentson wire, a Mickelson catheter was advanced to the level of the thoracic aorta where it was back bled and flushed. The catheter was then utilized to select the celiac artery and a celiac arteriogram was performed. 100 mcg of nitroglycerin was not administered intra-arterially through the celiac arterial vascular distribution.  With the use of a Fathom 14 microwire, a regular Renegade microcatheter was advanced into the common hepatic artery and a common hepatic arteriogram was performed. The microcatheter was then advanced into the distal aspect of the gastroduodenal artery and a sub selective gastroduodenal  arteriogram was performed.  Initially, the distal tributaries of the GDA were embolized with 700-900 micron Embospheres. Limited post particle embolization arteriogram was performed. Subsequently, the gastroduodenal artery was coil embolized to near its origin with a combination of overlapping 2 and 3 mm diameter interlock  coils.  The micro catheter was retracted into the common hepatic artery and a repeat common hepatic arteriogram was performed. The micro catheter was removed and a post embolization celiac arteriogram was performed. All images were reviewed and the procedure was terminated. All wires, catheters and sheaths were removed from the patient. Hemostasis was achieved at the right groin access site with manual compression.  ANESTHESIA/SEDATION: Fentanyl 200 mcg IV; Versed 4 mg IV  60 minutes  CONTRAST:  50 cc Omnipaque 300  COMPARISON:  None.  FLUOROSCOPY TIME:  11 min, 42 seconds  COMPLICATIONS: None immediate  FINDINGS: Celiac arteriogram demonstrates persistent irregular narrowing of both the common and proper hepatic arteries extending preferentially involve the origin of the right hepatic artery.  The gastroduodenal artery remains patent though irregularly narrowed throughout its course.  The gastroduodenal artery was successfully cannulated and embolized with a combination of 700 to 900 micron Embospheres and overlapping interlock coils to near its origin. 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  IMPRESSION: 1. Successful prophylactic particle and coil embolization of the gastroduodenal artery secondary to recurrent and refractory upper GI (duodenal) bleed due to malignant invasion from patient's known pancreatic head carcinoma. 2. While the common and proper hepatic arteries remain irregularly narrowed throughout their course, I do not feel that these vessels are contributing to the patient's current hemodynamic instability and as such were not embolized.  PLAN: Pending the patient's wishes, would recommend continued aggressive conservative management. 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   Electronically Signed   By: Simonne Come M.D.   On: 02/18/2013 13:58   Ir Angiogram Visceral Selective  02/18/2013   CLINICAL DATA:  57 year old with metastatic pancreatic cancer. Acute and recurrent upper GI bleeding. The patient has had previous duodenal bleeding based on endoscopy.  EXAM: VISCERAL ARTERIOGRAPHY ; SELECTIVE VISCERAL ARTERIOGRAPHY ; ULTRASOUND GUIDANCE FOR VASCULAR ACCESS  Physician: Rachelle Hora. Henn, MD  MEDICATIONS: Versed 5 mg, fentanyl 100 mcg. A radiology nurse monitored the patient for moderate sedation.  ANESTHESIA/SEDATION: Moderate sedation time: 1 hr and 45 min  CONTRAST:  85 mL Omnipaque 300  FLUOROSCOPY TIME:  29 min and 30 seconds  PROCEDURE: The procedure was explained to the patient. The risks and benefits of the procedure were discussed and the patient's questions were addressed. Informed consent was obtained from the patient. Patient was brought to the interventional suite. The right groin was prepped and draped in sterile fashion. Maximal barrier sterile technique was utilized including caps, mask, sterile gowns, sterile gloves, sterile drape, hand hygiene and skin antiseptic. The skin was anesthetized with 1% lidocaine. Using ultrasound guidance, a 21 gauge needle was directed into the right common femoral artery and a micropuncture dilator set was placed. A 5 French vascular sheath was placed over a Bentson wire. The superior mesenteric artery was cannulated with a  C2 catheter. Superior mesenteric arteriogram was performed. The C2 catheter was used to cannulate the celiac trunk. Celiac arteriography was performed. A Glidewire was advanced into the common hepatic artery and the C2 catheter was advanced into the proximal common hepatic  artery. At this point, there appeared to be extensive spasm in the common hepatic artery. C2 catheter was pulled back to the celiac trunk origin and additional angiography was performed. Multiple attempts were made to cannulate the common hepatic artery with a Renegade micro catheter. Cannulating the common hepatic artery was very difficult with the micro catheter. As result, the C2 catheter was exchanged for a Sos catheter. Eventually, a glide micro wire was advanced into the common hepatic artery and proper hepatic artery. The Renegade catheter was advanced to the common hepatic artery and additional angiography was performed. Catheter was also advanced into the proper hepatic artery and additional angiography was performed. Multiple attempts were made to cannulate the gastroduodenal artery with a wire and catheter. These attempts at cannulating the gastroduodenal artery were unsuccessful. The micro catheter and 5 French catheter were removed. Final angiogram was performed through the right groin sheath. The groin sheath was removed with an Exoseal closure device. There was groin hemostasis at the end of the procedure.  COMPLICATIONS: None  FINDINGS: Superior mesenteric arteriogram: Patient has a metallic biliary stent and IVC filter. Patient has a large replaced right hepatic artery that originates near the origin of the superior mesenteric artery. There are small pancreatic-duodenal branches but no evidence for a vascular malformation or contrast extravasation. The delayed images demonstrate patency of the superior mesenteric veins but there is marked attenuation and narrowing of the portal confluence and main portal vein. The intrahepatic portal venous system appears to be patent.  Celiac arteriography: The splenic artery is large and patent. There is a prominent left gastric artery with filling of the right gastric artery. There is mild narrowing and irregularity at the common hepatic artery and proper hepatic  artery. There appears to be irregularity of the gastroduodenal artery. There is narrowing involving the origin of the right hepatic artery. There is no evidence for acute extravasation. After a wire was advanced into the common hepatic artery, there was marked narrowing of the common hepatic artery suggestive for vasospasm and minimal flow into the gastroduodenal artery. At one point, there appeared to be retrograde flow into the gastroduodenal artery. After the vasospasm resolved, the flow to the common hepatic artery and gastroduodenal artery returned to baseline.  IMPRESSION: Irregularity of the common hepatic artery, gastroduodenal artery and proper hepatic artery. These findings are related to the surrounding tumor burden. There was extensive vasospasm in the common hepatic artery during this procedure but no evidence for acute bleeding or contrast extravasation. The gastroduodenal artery could never be successfully cannulated and, therefore, an empiric gastroduodenal embolization could not be performed.  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   Electronically Signed   By: Richarda Overlie M.D.   On: 02/18/2013 10:29   Ir Angiogram Visceral Selective  02/18/2013   CLINICAL DATA:  57 year old with metastatic pancreatic cancer. Acute and recurrent upper GI bleeding. The patient has had previous duodenal bleeding based on endoscopy.  EXAM: VISCERAL ARTERIOGRAPHY ; SELECTIVE VISCERAL ARTERIOGRAPHY ; ULTRASOUND GUIDANCE FOR VASCULAR ACCESS  Physician: Rachelle Hora. Henn, MD  MEDICATIONS: Versed 5 mg, fentanyl 100 mcg. A radiology nurse monitored the patient for moderate sedation.  ANESTHESIA/SEDATION: Moderate sedation time: 1 hr and 45 min  CONTRAST:  85 mL Omnipaque 300  FLUOROSCOPY TIME:  29 min and 30 seconds  PROCEDURE: The procedure was explained to the patient. The risks and benefits of the procedure were discussed and the patient's questions were addressed. Informed consent was  obtained from the patient. Patient was brought to the interventional suite. The right groin was prepped and draped in sterile fashion. Maximal barrier sterile technique was utilized including caps, mask, sterile gowns, sterile gloves, sterile drape, hand hygiene and skin antiseptic. The skin was anesthetized with 1% lidocaine. Using ultrasound guidance, a 21 gauge needle was directed into the right common femoral artery and a micropuncture dilator set was placed. A 5 French vascular sheath was placed over a Bentson wire. The superior mesenteric artery was cannulated with a C2 catheter. Superior mesenteric arteriogram was performed. The C2 catheter was used to cannulate the celiac trunk. Celiac arteriography was performed. A Glidewire was advanced into the common hepatic artery and the C2 catheter was advanced into the proximal common hepatic artery. At this point, there appeared to be extensive spasm in the common hepatic artery. C2 catheter was pulled back to the celiac trunk origin and additional angiography was performed. Multiple attempts were made to cannulate the common hepatic artery with a Renegade micro catheter. Cannulating the common hepatic artery was very difficult with the micro catheter. As result, the C2 catheter was exchanged for a Sos catheter. Eventually, a glide micro wire was advanced into the common hepatic artery and proper hepatic artery. The Renegade catheter was advanced to the common hepatic artery and additional angiography was performed. Catheter was also advanced into the proper hepatic artery and additional angiography was performed. Multiple attempts were made to cannulate the gastroduodenal artery with a wire and catheter. These attempts at cannulating the gastroduodenal artery were unsuccessful. The micro catheter and 5 French catheter were removed. Final angiogram was performed through the right groin sheath. The groin sheath was removed with an Exoseal closure device. There was  groin hemostasis at the end of the procedure.  COMPLICATIONS: None  FINDINGS: Superior mesenteric arteriogram: Patient has a metallic biliary stent and IVC filter. Patient has a large replaced right hepatic artery that originates near the origin of the superior mesenteric artery. There are small pancreatic-duodenal branches but no evidence for a vascular malformation or contrast extravasation. The delayed images demonstrate patency of the superior mesenteric veins but there is marked attenuation and narrowing of the portal confluence and main portal vein. The intrahepatic portal venous system appears to be patent.  Celiac arteriography: The splenic artery is large and patent. There is a prominent left gastric artery with filling of the right gastric artery. There is mild narrowing and irregularity at the common hepatic artery and proper hepatic artery. There appears to be irregularity of the gastroduodenal artery. There is narrowing involving the origin of the right hepatic artery. There is no evidence for acute extravasation. After a wire was advanced into the common hepatic artery, there was marked narrowing of the common hepatic artery suggestive for vasospasm and minimal flow into the gastroduodenal artery. At one point, there appeared to be retrograde flow into the gastroduodenal artery. After the vasospasm resolved, the flow to the common hepatic artery and gastroduodenal artery returned to baseline.  IMPRESSION: Irregularity of the common hepatic artery, gastroduodenal artery and proper hepatic artery. These findings are related to the surrounding tumor burden. There was extensive vasospasm in the common hepatic artery during this procedure but no evidence for acute bleeding or contrast extravasation. The  gastroduodenal artery could never be successfully cannulated and, therefore, an empiric gastroduodenal embolization could not be performed.  Severe narrowing in the portal venous system related to the  metastatic tumor.   Electronically Signed   By: Richarda Overlie M.D.   On: 02/18/2013 10:29   Ir Angiogram Selective Each Additional Vessel  02/18/2013   INDICATION: History of metastatic pancreatic cancer, now with acute and recurrent upper GI bleeding from the duodenum based on recent endoscopy. Post failed attempted endoscopic duodenal stent placement and mesenteric arteriogram  EXAM: 1.  CELIAC ARTERIOGRAM  2.  COMMON HEPATIC ARTERIOGRAM (3RD ORDER).  3. SUB SELECTIVE GASTRODUODENAL ARTERIOGRAM (3RD ORDER) WITH PARTICLE AND COIL EMBOLIZATION  4.  ULTRASOUND GUIDANCE FOR ARTERIAL ACCESS  MEDICATIONS: Nitroglycerin 100 mcg IA  TECHNIQUE: Informed written consent was obtained from the patient after a discussion of the risks, benefits and alternatives to treatment. Questions regarding the procedure were encouraged and answered. A timeout was performed prior to the initiation of the procedure.  The right groin was prepped and draped in the usual sterile fashion, and a sterile drape was applied covering the operative field. Maximum barrier sterile technique with sterile gowns and gloves were used for the procedure. A timeout was performed prior to the initiation of the procedure. Local anesthesia was provided with 1% lidocaine.  The right femoral head was marked fluoroscopically. Under ultrasound guidance, the right common femoral artery was accessed with a micropuncture kit after the overlying soft tissues were anesthetized with 1% lidocaine. An ultrasound image was saved for documentation purposes. The micropuncture sheath was exchanged for a 5 [place] vascular sheath over a Bentson wire. A closure arteriogram was performed through the side of the sheath confirming access within the right common femoral artery.  Over a Bentson wire, a Mickelson catheter was advanced to the level of the thoracic aorta where it was back bled and flushed. The catheter was then utilized to select the celiac artery and a celiac arteriogram  was performed. 100 mcg of nitroglycerin was not administered intra-arterially through the celiac arterial vascular distribution.  With the use of a Fathom 14 microwire, a regular Renegade microcatheter was advanced into the common hepatic artery and a common hepatic arteriogram was performed. The microcatheter was then advanced into the distal aspect of the gastroduodenal artery and a sub selective gastroduodenal arteriogram was performed.  Initially, the distal tributaries of the GDA were embolized with 700-900 micron Embospheres. Limited post particle embolization arteriogram was performed. Subsequently, the gastroduodenal artery was coil embolized to near its origin with a combination of overlapping 2 and 3 mm diameter interlock coils.  The micro catheter was retracted into the common hepatic artery and a repeat common hepatic arteriogram was performed. The micro catheter was removed and a post embolization celiac arteriogram was performed. All images were reviewed and the procedure was terminated. All wires, catheters and sheaths were removed from the patient. Hemostasis was achieved at the right groin access site with manual compression.  ANESTHESIA/SEDATION: Fentanyl 200 mcg IV; Versed 4 mg IV  60 minutes  CONTRAST:  50 cc Omnipaque 300  COMPARISON:  None.  FLUOROSCOPY TIME:  11 min, 42 seconds  COMPLICATIONS: None immediate  FINDINGS: Celiac arteriogram demonstrates persistent irregular narrowing of both the common and proper hepatic arteries extending preferentially involve the origin of the right hepatic artery.  The gastroduodenal artery remains patent though irregularly narrowed throughout its course.  The gastroduodenal artery was successfully cannulated and embolized with a combination of 700 to 900  micron Embospheres and overlapping interlock coils to near its origin. 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  IMPRESSION: 1. Successful prophylactic  particle and coil embolization of the gastroduodenal artery secondary to recurrent and refractory upper GI (duodenal) bleed due to malignant invasion from patient's known pancreatic head carcinoma. 2. While the common and proper hepatic arteries remain irregularly narrowed throughout their course, I do not feel that these vessels are contributing to the patient's current hemodynamic instability and as such were not embolized.  PLAN: Pending the patient's wishes, would recommend continued aggressive conservative management. 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   Electronically Signed   By: Simonne Come M.D.   On: 02/18/2013 13:58   Ir Angiogram Selective Each Additional Vessel  02/18/2013   CLINICAL DATA:  57 year old with metastatic pancreatic cancer. Acute and recurrent upper GI bleeding. The patient has had previous duodenal bleeding based on endoscopy.  EXAM: VISCERAL ARTERIOGRAPHY ; SELECTIVE VISCERAL ARTERIOGRAPHY ; ULTRASOUND GUIDANCE FOR VASCULAR ACCESS  Physician: Rachelle Hora. Henn, MD  MEDICATIONS: Versed 5 mg, fentanyl 100 mcg. A radiology nurse monitored the patient for moderate sedation.  ANESTHESIA/SEDATION: Moderate sedation time: 1 hr and 45 min  CONTRAST:  85 mL Omnipaque 300  FLUOROSCOPY TIME:  29 min and 30 seconds  PROCEDURE: The procedure was explained to the patient. The risks and benefits of the procedure were discussed and the patient's questions were addressed. Informed consent was obtained from the patient. Patient was brought to the interventional suite. The right groin was  prepped and draped in sterile fashion. Maximal barrier sterile technique was utilized including caps, mask, sterile gowns, sterile gloves, sterile drape, hand hygiene and skin antiseptic. The skin was anesthetized with 1% lidocaine. Using ultrasound guidance, a 21 gauge needle was directed into the right common femoral artery and a micropuncture dilator set was placed. A 5 French vascular sheath was placed over a Bentson wire. The superior mesenteric artery was cannulated with a C2 catheter. Superior mesenteric arteriogram was performed. The C2 catheter was used to cannulate the celiac trunk. Celiac arteriography was performed. A Glidewire was advanced into the common hepatic artery and the C2 catheter was advanced into the proximal common hepatic artery. At this point, there appeared to be extensive spasm in the common hepatic artery. C2 catheter was pulled back to the celiac trunk origin and additional angiography was performed. Multiple attempts were made to cannulate the common hepatic artery with a Renegade micro catheter. Cannulating the common hepatic artery was very difficult with the micro catheter. As result, the C2 catheter was exchanged for a Sos catheter. Eventually, a glide micro wire was advanced into the common hepatic artery and proper hepatic artery. The Renegade catheter was advanced to the common hepatic artery and additional angiography was performed. Catheter was also advanced into the proper hepatic artery and additional angiography was performed. Multiple attempts were made to cannulate the gastroduodenal artery with a wire and catheter. These attempts at cannulating the gastroduodenal artery were unsuccessful. The micro catheter and 5 French catheter were removed. Final angiogram was performed through the right groin sheath. The groin sheath  was removed with an Exoseal closure device. There was groin hemostasis at the end of the procedure.  COMPLICATIONS: None  FINDINGS: Superior mesenteric  arteriogram: Patient has a metallic biliary stent and IVC filter. Patient has a large replaced right hepatic artery that originates near the origin of the superior mesenteric artery. There are small pancreatic-duodenal branches but no evidence for a vascular malformation or contrast extravasation. The delayed images demonstrate patency of the superior mesenteric veins but there is marked attenuation and narrowing of the portal confluence and main portal vein. The intrahepatic portal venous system appears to be patent.  Celiac arteriography: The splenic artery is large and patent. There is a prominent left gastric artery with filling of the right gastric artery. There is mild narrowing and irregularity at the common hepatic artery and proper hepatic artery. There appears to be irregularity of the gastroduodenal artery. There is narrowing involving the origin of the right hepatic artery. There is no evidence for acute extravasation. After a wire was advanced into the common hepatic artery, there was marked narrowing of the common hepatic artery suggestive for vasospasm and minimal flow into the gastroduodenal artery. At one point, there appeared to be retrograde flow into the gastroduodenal artery. After the vasospasm resolved, the flow to the common hepatic artery and gastroduodenal artery returned to baseline.  IMPRESSION: Irregularity of the common hepatic artery, gastroduodenal artery and proper hepatic artery. These findings are related to the surrounding tumor burden. There was extensive vasospasm in the common hepatic artery during this procedure but no evidence for acute bleeding or contrast extravasation. The gastroduodenal artery could never be successfully cannulated and, therefore, an empiric gastroduodenal embolization could not be performed.  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   Electronically Signed   By: Richarda Overlie M.D.   On: 02/18/2013 10:29   Ir Angiogram  Selective Each Additional Vessel  02/18/2013   CLINICAL DATA:  57 year old with metastatic pancreatic cancer. Acute and recurrent upper GI bleeding. The patient has had previous duodenal bleeding based on endoscopy.  EXAM: VISCERAL ARTERIOGRAPHY ; SELECTIVE VISCERAL ARTERIOGRAPHY ; ULTRASOUND GUIDANCE FOR VASCULAR ACCESS  Physician: Rachelle Hora. Henn, MD  MEDICATIONS: Versed 5 mg, fentanyl 100 mcg. A radiology nurse monitored the patient for moderate sedation.  ANESTHESIA/SEDATION: Moderate sedation time: 1 hr and 45 min  CONTRAST:  85 mL Omnipaque 300  FLUOROSCOPY TIME:  29 min and 30 seconds  PROCEDURE: The procedure was explained to the patient. The risks and benefits of the procedure were discussed and the patient's questions were addressed. Informed consent was obtained from the patient. Patient was brought to the interventional suite. The right groin was prepped and draped in sterile fashion. Maximal barrier sterile technique was utilized including caps, mask, sterile gowns, sterile gloves, sterile drape, hand hygiene and skin antiseptic. The skin was anesthetized with 1% lidocaine. Using ultrasound guidance, a 21 gauge needle was directed into the right common femoral artery and a micropuncture dilator set was placed. A 5 French vascular sheath was placed over a Bentson wire. The superior mesenteric artery was cannulated with a C2 catheter. Superior mesenteric arteriogram was performed. The C2 catheter was used to cannulate the celiac trunk. Celiac arteriography was performed. A Glidewire was advanced into the common hepatic artery and the C2 catheter was advanced into the proximal common hepatic artery. At this point, there appeared to be extensive spasm in the common hepatic artery. C2 catheter was pulled back to the celiac trunk origin  and additional angiography was performed. Multiple attempts were made to cannulate the common hepatic artery with a Renegade micro catheter. Cannulating the common hepatic  artery was very difficult with the micro catheter. As result, the C2 catheter was exchanged for a Sos catheter. Eventually, a glide micro wire was advanced into the common hepatic artery and proper hepatic artery. The Renegade catheter was advanced to the common hepatic artery and additional angiography was performed. Catheter was also advanced into the proper hepatic artery and additional angiography was performed. Multiple attempts were made to cannulate the gastroduodenal artery with a wire and catheter. These attempts at cannulating the gastroduodenal artery were unsuccessful. The micro catheter and 5 French catheter were removed. Final angiogram was performed through the right groin sheath. The groin sheath was removed with an Exoseal closure device. There was groin hemostasis at the end of the procedure.  COMPLICATIONS: None  FINDINGS: Superior mesenteric arteriogram: Patient has a metallic biliary stent and IVC filter. Patient has a large replaced right hepatic artery that originates near the origin of the superior mesenteric artery. There are small pancreatic-duodenal branches but no evidence for a vascular malformation or contrast extravasation. The delayed images demonstrate patency of the superior mesenteric veins but there is marked attenuation and narrowing of the portal confluence and main portal vein. The intrahepatic portal venous system appears to be patent.  Celiac arteriography: The splenic artery is large and patent. There is a prominent left gastric artery with filling of the right gastric artery. There is mild narrowing and irregularity at the common hepatic artery and proper hepatic artery. There appears to be irregularity of the gastroduodenal artery. There is narrowing involving the origin of the right hepatic artery. There is no evidence for acute extravasation. After a wire was advanced into the common hepatic artery, there was marked narrowing of the common hepatic artery suggestive for  vasospasm and minimal flow into the gastroduodenal artery. At one point, there appeared to be retrograde flow into the gastroduodenal artery. After the vasospasm resolved, the flow to the common hepatic artery and gastroduodenal artery returned to baseline.  IMPRESSION: Irregularity of the common hepatic artery, gastroduodenal artery and proper hepatic artery. These findings are related to the surrounding tumor burden. There was extensive vasospasm in the common hepatic artery during this procedure but no evidence for acute bleeding or contrast extravasation. The gastroduodenal artery could never be successfully cannulated and, therefore, an empiric gastroduodenal embolization could not be performed.  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   Electronically Signed   By: Richarda Overlie M.D.   On: 02/18/2013 10:29   Ir US Guide Vasc Access Right  02/18/2013   INDICATION: History of metastatic pancreatic cancer, now with acute and recurrent upper GI bleeding from the duodenum based on recent endoscopy. Post failed attempted endoscopic duodenal stent placement and mesenteric arteriogram  EXAM: 1.  CELIAC ARTERIOGRAM  2.  COMMON HEPATIC ARTERIOGRAM (3RD ORDER).  3. SUB SELECTIVE GASTRODUODENAL ARTERIOGRAM (3RD ORDER) WITH PARTICLE AND COIL EMBOLIZATION  4.  ULTRASOUND GUIDANCE FOR ARTERIAL ACCESS  MEDICATIONS: Nitroglycerin 100 mcg IA  TECHNIQUE: Informed written consent was obtained from the patient after a discussion of the risks, benefits and alternatives to treatment. Questions regarding the procedure were encouraged and answered. A timeout was performed prior to the initiation of the procedure.  The right groin was prepped and draped in the usual sterile fashion, and a sterile drape was applied covering the operative field. Maximum barrier  sterile technique with sterile gowns and gloves were used for the procedure. A timeout was performed prior to the initiation of the  procedure. Local anesthesia was provided with 1% lidocaine.  The right femoral head was marked fluoroscopically. Under ultrasound guidance, the right common femoral artery was accessed with a micropuncture kit after the overlying soft tissues were anesthetized with 1% lidocaine. An ultrasound image was saved for documentation purposes. The micropuncture sheath was exchanged for a 5 [place] vascular sheath over a Bentson wire. A closure arteriogram was performed through the side of the sheath confirming access within the right common femoral artery.  Over a Bentson wire, a Mickelson catheter was advanced to the level of the thoracic aorta where it was back bled and flushed. The catheter was then utilized to select the celiac artery and a celiac arteriogram was performed. 100 mcg of nitroglycerin was not administered intra-arterially through the celiac arterial vascular distribution.  With the use of a Fathom 14 microwire, a regular Renegade microcatheter was advanced into the common hepatic artery and a common hepatic arteriogram was performed. The microcatheter was then advanced into the distal aspect of the gastroduodenal artery and a sub selective gastroduodenal arteriogram was performed.  Initially, the distal tributaries of the GDA were embolized with 700-900 micron Embospheres. Limited post particle embolization arteriogram was performed. Subsequently, the gastroduodenal artery was coil embolized to near its origin with a combination of overlapping 2 and 3 mm diameter interlock coils.  The micro catheter was retracted into the common hepatic artery and a repeat common hepatic arteriogram was performed. The micro catheter was removed and a post embolization celiac arteriogram was performed. All images were reviewed and the procedure was terminated. All wires, catheters and sheaths were removed from the patient. Hemostasis was achieved at the right groin access site with manual compression.  ANESTHESIA/SEDATION:  Fentanyl 200 mcg IV; Versed 4 mg IV  60 minutes  CONTRAST:  50 cc Omnipaque 300  COMPARISON:  None.  FLUOROSCOPY TIME:  11 min, 42 seconds  COMPLICATIONS: None immediate  FINDINGS: Celiac arteriogram demonstrates persistent irregular narrowing of both the common and proper hepatic arteries extending preferentially involve the origin of the right hepatic artery.  The gastroduodenal artery remains patent though irregularly narrowed throughout its course.  The gastroduodenal artery was successfully cannulated and embolized with a combination of 700 to 900 micron Embospheres and overlapping interlock coils to near its origin. 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  IMPRESSION: 1. Successful prophylactic particle and coil embolization of the gastroduodenal artery secondary to recurrent and refractory upper GI (duodenal) bleed due to malignant invasion from patient's known pancreatic head carcinoma. 2. While the common and proper hepatic arteries remain irregularly narrowed throughout their course, I do not feel that these vessels are contributing to the patient's current hemodynamic instability and as such were not embolized.  PLAN: Pending the patient's wishes, would recommend continued aggressive conservative management. 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   Electronically Signed   By: Simonne Come M.D.   On: 02/18/2013 13:58   Ir US Guide Vasc Access Right  02/18/2013  CLINICAL DATA:  57 year old with metastatic pancreatic cancer. Acute and  recurrent upper GI bleeding. The patient has had previous duodenal bleeding based on endoscopy.  EXAM: VISCERAL ARTERIOGRAPHY ; SELECTIVE VISCERAL ARTERIOGRAPHY ; ULTRASOUND GUIDANCE FOR VASCULAR ACCESS  Physician: Rachelle Hora. Henn, MD  MEDICATIONS: Versed 5 mg, fentanyl 100 mcg. A radiology nurse monitored the patient for moderate sedation.  ANESTHESIA/SEDATION: Moderate sedation time: 1 hr and 45 min  CONTRAST:  85 mL Omnipaque 300  FLUOROSCOPY TIME:  29 min and 30 seconds  PROCEDURE: The procedure was explained to the patient. The risks and benefits of the procedure were discussed and the patient's questions were addressed. Informed consent was obtained from the patient. Patient was brought to the interventional suite. The right groin was prepped and draped in sterile fashion. Maximal barrier sterile technique was utilized including caps, mask, sterile gowns, sterile gloves, sterile drape, hand hygiene and skin antiseptic. The skin was anesthetized with 1% lidocaine. Using ultrasound guidance, a 21 gauge needle was directed into the right common femoral artery and a micropuncture dilator set was placed. A 5 French vascular sheath was placed over a Bentson wire. The superior mesenteric artery was cannulated with a C2 catheter. Superior mesenteric arteriogram was performed. The C2 catheter was used to cannulate the celiac trunk. Celiac arteriography was performed. A Glidewire was advanced into the common hepatic artery and the C2 catheter was advanced into the proximal common hepatic artery. At this point, there appeared to be extensive spasm in the common hepatic artery. C2 catheter was pulled back to the celiac trunk origin and additional angiography was performed. Multiple attempts were made to cannulate the common hepatic artery with a Renegade micro catheter. Cannulating the common hepatic artery was very difficult with the micro catheter. As result, the C2 catheter was exchanged for a Sos catheter. Eventually, a  glide micro wire was advanced into the common hepatic artery and proper hepatic artery. The Renegade catheter was advanced to the common hepatic artery and additional angiography was performed. Catheter was also advanced into the proper hepatic artery and additional angiography was performed. Multiple attempts were made to cannulate the gastroduodenal artery with a wire and catheter. These attempts at cannulating the gastroduodenal artery were unsuccessful. The micro catheter and 5 French catheter were removed. Final angiogram was performed through the right groin sheath. The groin sheath was removed with an Exoseal closure device. There was groin hemostasis at the end of the procedure.  COMPLICATIONS: None  FINDINGS: Superior mesenteric arteriogram: Patient has a metallic biliary stent and IVC filter. Patient has a large replaced right hepatic artery that originates near the origin of the superior mesenteric artery. There are small pancreatic-duodenal branches but no evidence for a vascular malformation or contrast extravasation. The delayed images demonstrate patency of the superior mesenteric veins but there is marked attenuation and narrowing of the portal confluence and main portal vein. The intrahepatic portal venous system appears to be patent.  Celiac arteriography: The splenic artery is large and patent. There is a prominent left gastric artery with filling of the right gastric artery. There is mild narrowing and irregularity at the common hepatic artery and proper hepatic artery. There appears to be irregularity of the gastroduodenal artery. There is narrowing involving the origin of the right hepatic artery. There is no evidence for acute extravasation. After a wire was advanced into the common hepatic artery, there was marked narrowing of the common hepatic artery suggestive for vasospasm and minimal flow into the gastroduodenal artery. At  one point, there appeared to be retrograde flow into the  gastroduodenal artery. After the vasospasm resolved, the flow to the common hepatic artery and gastroduodenal artery returned to baseline.  IMPRESSION: Irregularity of the common hepatic artery, gastroduodenal artery and proper hepatic artery. These findings are related to the surrounding tumor burden. There was extensive vasospasm in the common hepatic artery during this procedure but no evidence for acute bleeding or contrast extravasation. The gastroduodenal artery could never be successfully cannulated and, therefore, an empiric gastroduodenal embolization could not be performed.  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   Electronically Signed   By: Richarda Overlie M.D.   On: 02/18/2013 10:29   Ir Embo Art  Mattie Minium Hem[facility] [employer] Guide Roadmapping  02/18/2013   INDICATION: History of metastatic pancreatic cancer, now with acute and recurrent upper GI bleeding from the duodenum based on recent endoscopy. Post failed attempted endoscopic duodenal stent placement and mesenteric arteriogram  EXAM: 1.  CELIAC ARTERIOGRAM  2.  COMMON HEPATIC ARTERIOGRAM (3RD ORDER).  3. SUB SELECTIVE GASTRODUODENAL ARTERIOGRAM (3RD ORDER) WITH PARTICLE AND COIL EMBOLIZATION  4.  ULTRASOUND GUIDANCE FOR ARTERIAL ACCESS  MEDICATIONS: Nitroglycerin 100 mcg IA  TECHNIQUE: Informed written consent was obtained from the patient after a discussion of the risks, benefits and alternatives to treatment. Questions regarding the procedure were encouraged and answered. A timeout was performed prior to the initiation of the procedure.  The right groin was prepped and draped in the usual sterile fashion, and a sterile drape was applied covering the operative field. Maximum barrier sterile technique with sterile gowns and gloves were used for the procedure. A timeout was performed prior to the initiation of the procedure. Local anesthesia was provided with 1% lidocaine.  The right femoral head was marked  fluoroscopically. Under ultrasound guidance, the right common femoral artery was accessed with a micropuncture kit after the overlying soft tissues were anesthetized with 1% lidocaine. An ultrasound image was saved for documentation purposes. The micropuncture sheath was exchanged for a 5 [place] vascular sheath over a Bentson wire. A closure arteriogram was performed through the side of the sheath confirming access within the right common femoral artery.  Over a Bentson wire, a Mickelson catheter was advanced to the level of the thoracic aorta where it was back bled and flushed. The catheter was then utilized to select the celiac artery and a celiac arteriogram was performed. 100 mcg of nitroglycerin was not administered intra-arterially through the celiac arterial vascular distribution.  With the use of a Fathom 14 microwire, a regular Renegade microcatheter was advanced into the common hepatic artery and a common hepatic arteriogram was performed. The microcatheter was then advanced into the distal aspect of the gastroduodenal artery and a sub selective gastroduodenal arteriogram was performed.  Initially, the distal tributaries of the GDA were embolized with 700-900 micron Embospheres. Limited post particle embolization arteriogram was performed. Subsequently, the gastroduodenal artery was coil embolized to near its origin with a combination of overlapping 2 and 3 mm diameter interlock coils.  The micro catheter was retracted into the common hepatic artery and a repeat common hepatic arteriogram was performed. The micro catheter was removed and a post embolization celiac arteriogram was performed. All images were reviewed and the procedure was terminated. All wires, catheters and sheaths were removed from the patient. Hemostasis was achieved at the right groin access site with manual compression.  ANESTHESIA/SEDATION: Fentanyl 200 mcg IV; Versed 4 mg IV  60 minutes  CONTRAST:  50 cc Omnipaque 300  COMPARISON:   None.  FLUOROSCOPY TIME:  11 min, 42 seconds  COMPLICATIONS: None immediate  FINDINGS: Celiac arteriogram demonstrates persistent irregular narrowing of both the common and proper hepatic arteries extending preferentially involve the origin of the right hepatic artery.  The gastroduodenal artery remains patent though irregularly narrowed throughout its course.  The gastroduodenal artery was successfully cannulated and embolized with a combination of 700 to 900 micron Embospheres and overlapping interlock coils to near its origin. 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  IMPRESSION: 1. Successful prophylactic particle and coil embolization of the gastroduodenal artery secondary to recurrent and refractory upper GI (duodenal) bleed due to malignant invasion from patient's known pancreatic head carcinoma. 2. While the common and proper hepatic arteries remain irregularly narrowed throughout their course, I do not feel that these vessels are contributing to the patient's current hemodynamic instability and as such were not embolized.  PLAN: Pending the patient's wishes, would recommend continued aggressive conservative management. 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   Electronically Signed   By: Simonne Come M.D.   On: 02/18/2013 13:58    Anti-infectives: Anti-infectives   None      Assessment/Plan: s/p empiric GDA embolization 12/18 secondary to recurrent GI bleeding; stable at present; further plans as per primary/ onc/GI   LOS: 4 days     Donzella Carrol,D [facility] 02/19/2013

## 2013-02-19 NOTE — Progress Notes (Signed)
PT Cancellation Note  Patient Details Name: Austin Escobar MRN: [id number] DOB: 1955/08/28   Cancelled Treatment:     PT deferred, pt for radiation tx.  Will follow.   Margarine Grosshans 02/19/2013, 3:33 PM

## 2013-02-19 NOTE — Progress Notes (Signed)
It looks like radiology was able to successfully performed and embolization of the GDA. Hopefully this will stop the oral is slow to the bleeding. His hemoglobin is holding steady at 9.2. Platelet count is 85.  He will continue radiation therapy.  There is no pain. He's had no nausea vomiting. Is no cough or shortness of breath. He's had no fever.  On his physical exam, his vital signs are temperature 98.2. Blood pressure 133/79. Pulse is 82. His lungs are clear. Cardiac exam regular rate and rhythm with no murmurs rubs or bruits. Abdomen is soft. There may be some slight distention. Bowel sounds are present. There is no palpable mass. He has no palpable hepatospleno megaly. Extremities shows no clubbing cyanosis or edema.  Again, it appears that the GI bleeding may have stopped. Hopefully, the embolization will prevent this from re- occurring.  Now we can hope that the radiation therapy will cause some scarring in the area of tumor invasion to further slow down the bleeding.  He is hungry. Hopefully we can allow him to have regular food again.  Possibly, he might be able to move out of the ICU over the weekend.  The hopefully can now increase his level of activity.  I very much appreciate how much help we have gotten from all the specialists and the staff in the ICU.  [name]  Psalm 27:14

## 2013-02-19 NOTE — Progress Notes (Signed)
[facility] PROGRESS NOTE  Austin Escobar QIO:[id number] DOB: June 17, 1955 DOA: 02/15/2013 PCP: Austin Lemons, DO  Interim history  57 y.o. 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 Due to poor prognosis, patient was referred for possible trial at [facility] by Dr. [name] (oncologist), although not yet accepted. He was DNR/DNI. Readmit on 12/15 and wanted to change to FULL CODE. Recurrent GIB beginning on the evening of 12/14, with blood mixed with stool and then progressed with 3-4 episodes of frank bloody clots. He had subsequent dizziness. Hgb on admission was  7.3 (was 9.0 on discharge) , requiring 2 units of blood. He also received 2 units of FFP 02/16/2013 due to INR 1.7. He has had a bowel movement this morning without any visible blood. Dr. [name] attempted duodenal stent to assist with tamponade of bleed, but this was unsuccessful due to delivery system being too short.  Assessment/Plan: Acute blood loss anemia  -2 additional units PRBC 12/18 -Has received 7 units PRBCs during this admission; 2units FFP -Underwent embolization 12/18 of the GDA by IR yesterday. -Has not had any further bleeding today; in fact had a BM without blood. -appreciate GI and IR followup  -Avoid NSAIDs  -Hemoglobin has remained fairly stable. Patient is hemodynamically stable  -Full liquid diet for now  -origin of bleed likely from duodenal bulb where there is tumor invasion   GI bleed -As above.  Coagulopathy  -Received 2 units FFP 02/16/2013 when his INR was 1.7  -vitamin K given 12/18  Esophageal varices  -No bleeding noted from previous endoscopy  -Continue Protonix and Carafate   Saddle pulmonary embolus  -12/23/2012  -Status post IVC filter in December 2014  -Currently stable without any respiratory distress or hypoxemia   Atrial fibrillation  -Status post ablation 05/25/2012  -Currently in sinus rhythm  -Not a candidate for anticoagulation   Metastatic pancreatic adenocarcinoma with duodenal invasion  -Appreciate oncology followup  -continue radiation therapy. -Dr. [name] is following   -LIMITED CODE--NO CPR, NO SHOCK, NO INTUBATION, YES VASOPRESSOR MEDS  HTN  -Metoprolol tartrate is on hold due to soft blood pressures   Family Communication: Patient only Disposition Plan: Transfer to floor. Start PT/OT, mobilize more.      Procedures/Studies: Ir Angiogram Visceral Selective  02/18/2013   INDICATION: History of metastatic  pancreatic cancer, now with acute and recurrent upper GI bleeding from the duodenum based on recent endoscopy.  Post failed attempted endoscopic duodenal stent placement and mesenteric arteriogram  EXAM: 1.  CELIAC ARTERIOGRAM  2.  COMMON HEPATIC ARTERIOGRAM (3RD ORDER).  3. SUB SELECTIVE GASTRODUODENAL ARTERIOGRAM (3RD ORDER) WITH PARTICLE AND COIL EMBOLIZATION  4.  ULTRASOUND GUIDANCE FOR ARTERIAL ACCESS  MEDICATIONS: Nitroglycerin 100 mcg IA  TECHNIQUE: Informed written consent was obtained from the patient after a discussion of the risks, benefits and alternatives to treatment. Questions regarding the procedure were encouraged and answered. A timeout was performed prior to the initiation of the procedure.  The right groin was prepped and draped in the usual sterile fashion, and a sterile drape was applied covering the operative field. Maximum barrier sterile technique with sterile gowns and gloves were used for the procedure. A timeout was performed prior to the initiation of the procedure. Local anesthesia was provided with 1% lidocaine.  The right femoral head was marked fluoroscopically. Under ultrasound guidance, the right common femoral artery was accessed with a micropuncture kit after the overlying soft tissues were anesthetized with 1% lidocaine. An ultrasound image was saved for documentation purposes. The micropuncture sheath was exchanged for a 5 [place] vascular sheath over a Bentson wire. A closure arteriogram was performed through the side of the sheath confirming access within the right common femoral artery.  Over a Bentson wire, a Mickelson catheter was advanced to the level of the thoracic aorta where it was back bled and flushed. The catheter was then utilized to select the celiac artery and a celiac arteriogram was performed. 100 mcg of nitroglycerin was not administered intra-arterially through the celiac arterial vascular distribution.  With the use of a Fathom 14 microwire, a regular Renegade microcatheter was advanced into the common hepatic artery and a common hepatic arteriogram was performed. The  microcatheter was then advanced into the distal aspect of the gastroduodenal artery and a sub selective gastroduodenal arteriogram was performed.  Initially, the distal tributaries of the GDA were embolized with 700-900 micron Embospheres. Limited post particle embolization arteriogram was performed. Subsequently, the gastroduodenal artery was coil embolized to near its origin with a combination of overlapping 2 and 3 mm diameter interlock coils.  The micro catheter was retracted into the common hepatic artery and a repeat common hepatic arteriogram was performed. The micro catheter was removed and a post embolization celiac arteriogram was performed. All images were reviewed and the procedure was terminated. All wires, catheters and sheaths were removed from the patient. Hemostasis was achieved at the right groin access site with manual compression.  ANESTHESIA/SEDATION: Fentanyl 200 mcg IV; Versed 4 mg IV  60 minutes  CONTRAST:  50 cc Omnipaque 300  COMPARISON:  None.  FLUOROSCOPY TIME:  11 min, 42 seconds  COMPLICATIONS: None immediate  FINDINGS: Celiac arteriogram demonstrates persistent irregular narrowing of both the common and proper hepatic arteries extending preferentially involve the origin of the right hepatic artery.  The gastroduodenal artery remains patent though irregularly narrowed throughout its course.  The gastroduodenal artery was successfully cannulated and embolized with a combination of 700 to 900 micron Embospheres and overlapping interlock coils to near its origin. 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  IMPRESSION: 1. Successful prophylactic particle and coil embolization of the gastroduodenal artery secondary to recurrent and refractory upper GI (duodenal) bleed due to malignant invasion from patient's known pancreatic head carcinoma. 2. While the common and proper hepatic arteries remain irregularly  narrowed throughout their course, I  do not feel that these vessels are contributing to the patient's current hemodynamic instability and as such were not embolized.  PLAN: Pending the patient's wishes, would recommend continued aggressive conservative management. 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   Electronically Signed   By: Simonne Come M.D.   On: 02/18/2013 13:58   Ir Angiogram Visceral Selective  02/18/2013   CLINICAL DATA:  57 year old with metastatic pancreatic cancer. Acute and recurrent upper GI bleeding. The patient has had previous duodenal bleeding based on endoscopy.  EXAM: VISCERAL ARTERIOGRAPHY ; SELECTIVE VISCERAL ARTERIOGRAPHY ; ULTRASOUND GUIDANCE FOR VASCULAR ACCESS  Physician: Rachelle Hora. Henn, MD  MEDICATIONS: Versed 5 mg, fentanyl 100 mcg. A radiology nurse monitored the patient for moderate sedation.  ANESTHESIA/SEDATION: Moderate sedation time: 1 hr and 45 min  CONTRAST:  85 mL Omnipaque 300  FLUOROSCOPY TIME:  29 min and 30 seconds  PROCEDURE: The procedure was explained to the patient. The risks and benefits of the procedure were discussed and the patient's questions were addressed. Informed consent was obtained from the patient. Patient was brought to the interventional suite. The right groin was prepped and draped in sterile fashion. Maximal barrier sterile technique was utilized including caps, mask, sterile gowns, sterile gloves, sterile drape, hand hygiene and skin antiseptic. The skin was anesthetized with 1% lidocaine. Using ultrasound guidance, a 21 gauge needle was directed into the right common  femoral artery and a micropuncture dilator set was placed. A 5 French vascular sheath was placed over a Bentson wire. The superior mesenteric artery was cannulated with a C2 catheter. Superior mesenteric arteriogram was performed. The C2 catheter was used to cannulate the celiac trunk. Celiac arteriography was performed. A Glidewire was advanced into the common hepatic artery and the C2 catheter was advanced into the proximal common hepatic artery. At this point, there appeared to be extensive spasm in the common hepatic artery. C2 catheter was pulled back to the celiac trunk origin and additional angiography was performed. Multiple attempts were made to cannulate the common hepatic artery with a Renegade micro catheter. Cannulating the common hepatic artery was very difficult with the micro catheter. As result, the C2 catheter was exchanged for a Sos catheter. Eventually, a glide micro wire was advanced into the common hepatic artery and proper hepatic artery. The Renegade catheter was advanced to the common hepatic artery and additional angiography was performed. Catheter was also advanced into the proper hepatic artery and additional angiography was performed. Multiple attempts were made to cannulate the gastroduodenal artery with a wire and catheter. These attempts at cannulating the gastroduodenal artery were unsuccessful. The micro catheter and 5 French catheter were removed. Final angiogram was performed through the right groin sheath. The groin sheath was removed with an Exoseal closure device. There was groin hemostasis at the end of the procedure.  COMPLICATIONS: None  FINDINGS: Superior mesenteric arteriogram: Patient has a metallic biliary stent and IVC filter. Patient has a large replaced right hepatic artery that originates near the origin of the superior mesenteric artery. There are small pancreatic-duodenal branches but no evidence for a vascular malformation  or contrast extravasation. The delayed  images demonstrate patency of the superior mesenteric veins but there is marked attenuation and narrowing of the portal confluence and main portal vein. The intrahepatic portal venous system appears to be patent.  Celiac arteriography: The splenic artery is large and patent. There is a prominent left gastric artery with filling of the right gastric artery. There is mild narrowing and irregularity at the common hepatic artery and proper hepatic artery. There appears to be irregularity of the gastroduodenal artery. There is narrowing involving the origin of the right hepatic artery. There is no evidence for acute extravasation. After a wire was advanced into the common hepatic artery, there was marked narrowing of the common hepatic artery suggestive for vasospasm and minimal flow into the gastroduodenal artery. At one point, there appeared to be retrograde flow into the gastroduodenal artery. After the vasospasm resolved, the flow to the common hepatic artery and gastroduodenal artery returned to baseline.  IMPRESSION: Irregularity of the common hepatic artery, gastroduodenal artery and proper hepatic artery. These findings are related to the surrounding tumor burden. There was extensive vasospasm in the common hepatic artery during this procedure but no evidence for acute bleeding or contrast extravasation. The gastroduodenal artery could never be successfully cannulated and, therefore, an empiric gastroduodenal embolization could not be performed.  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   Electronically Signed   By: Richarda Overlie M.D.   On: 02/18/2013 10:29   Ir Angiogram Visceral Selective  02/18/2013   CLINICAL DATA:  57 year old with metastatic pancreatic cancer. Acute and recurrent upper GI bleeding. The patient has had previous duodenal bleeding based on endoscopy.  EXAM: VISCERAL ARTERIOGRAPHY ; SELECTIVE VISCERAL ARTERIOGRAPHY ; ULTRASOUND GUIDANCE FOR VASCULAR ACCESS   Physician: Rachelle Hora. Henn, MD  MEDICATIONS: Versed 5 mg, fentanyl 100 mcg. A radiology nurse monitored the patient for moderate sedation.  ANESTHESIA/SEDATION: Moderate sedation time: 1 hr and 45 min  CONTRAST:  85 mL Omnipaque 300  FLUOROSCOPY TIME:  29 min and 30 seconds  PROCEDURE: The procedure was explained to the patient. The risks and benefits of the procedure were discussed and the patient's questions were addressed. Informed consent was obtained from the patient. Patient was brought to the interventional suite. The right groin was prepped and draped in sterile fashion. Maximal barrier sterile technique was utilized including caps, mask, sterile gowns, sterile gloves, sterile drape, hand hygiene and skin antiseptic. The skin was anesthetized with 1% lidocaine. Using ultrasound guidance, a 21 gauge needle was directed into the right common femoral artery and a micropuncture dilator set was placed. A 5 French vascular sheath was placed over a Bentson wire. The superior mesenteric artery was cannulated with a C2 catheter. Superior mesenteric arteriogram was performed. The C2 catheter was used to cannulate the celiac trunk. Celiac arteriography was performed. A Glidewire was advanced into the common hepatic artery and the C2 catheter was advanced into the proximal common hepatic artery. At this point, there appeared to be extensive spasm in the common hepatic artery. C2 catheter was pulled back to the celiac trunk origin and additional angiography was performed. Multiple attempts were made to cannulate the common hepatic artery with a Renegade micro catheter. Cannulating the common hepatic artery was very difficult with the micro catheter. As result, the C2 catheter was exchanged for a Sos catheter. Eventually, a glide micro wire was advanced into the common hepatic artery and proper hepatic artery. The Renegade catheter was advanced to the  common hepatic artery and additional angiography was performed. Catheter  was also advanced into the proper hepatic artery and additional angiography was performed. Multiple attempts were made to cannulate the gastroduodenal artery with a wire and catheter. These attempts at cannulating the gastroduodenal artery were unsuccessful. The micro catheter and 5 French catheter were removed. Final angiogram was performed through the right groin sheath. The groin sheath was removed with an Exoseal closure device. There was groin hemostasis at the end of the procedure.  COMPLICATIONS: None  FINDINGS: Superior mesenteric arteriogram: Patient has a metallic biliary stent and IVC filter. Patient has a large replaced right hepatic artery that originates near the origin of the superior mesenteric artery. There are small pancreatic-duodenal branches but no evidence for a vascular malformation or contrast extravasation. The delayed images demonstrate patency of the superior mesenteric veins but there is marked attenuation and narrowing of the portal confluence and main portal vein. The intrahepatic portal venous system appears to be patent.  Celiac arteriography: The splenic artery is large and patent. There is a prominent left gastric artery with filling of the right gastric artery. There is mild narrowing and irregularity at the common hepatic artery and proper hepatic artery. There appears to be irregularity of the gastroduodenal artery. There is narrowing involving the origin of the right hepatic artery. There is no evidence for acute extravasation. After a wire was advanced into the common hepatic artery, there was marked narrowing of the common hepatic artery suggestive for vasospasm and minimal flow into the gastroduodenal artery. At one point, there appeared to be retrograde flow into the gastroduodenal artery. After the vasospasm resolved, the flow to the common hepatic artery and gastroduodenal artery returned to baseline.  IMPRESSION: Irregularity of the common hepatic artery, gastroduodenal  artery and proper hepatic artery. These findings are related to the surrounding tumor burden. There was extensive vasospasm in the common hepatic artery during this procedure but no evidence for acute bleeding or contrast extravasation. The gastroduodenal artery could never be successfully cannulated and, therefore, an empiric gastroduodenal embolization could not be performed.  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   Electronically Signed   By: Richarda Overlie M.D.   On: 02/18/2013 10:29   Ir Angiogram Selective Each Additional Vessel  02/18/2013   INDICATION: History of metastatic pancreatic cancer, now with acute and recurrent upper GI bleeding from the duodenum based on recent endoscopy. Post failed attempted endoscopic duodenal stent placement and mesenteric arteriogram  EXAM: 1.  CELIAC ARTERIOGRAM  2.  COMMON HEPATIC ARTERIOGRAM (3RD ORDER).  3. SUB SELECTIVE GASTRODUODENAL ARTERIOGRAM (3RD ORDER) WITH PARTICLE AND COIL EMBOLIZATION  4.  ULTRASOUND GUIDANCE FOR ARTERIAL ACCESS  MEDICATIONS: Nitroglycerin 100 mcg IA  TECHNIQUE: Informed written consent was obtained from the patient after a discussion of the risks, benefits and alternatives to treatment. Questions regarding the procedure were encouraged and answered. A timeout was performed prior to the initiation of the procedure.  The right groin was prepped and draped in the usual sterile fashion, and a sterile drape was applied covering the operative field. Maximum barrier sterile technique with sterile gowns and gloves were used for the procedure. A timeout was performed prior to the initiation of the procedure. Local anesthesia was provided with 1% lidocaine.  The right femoral head was marked fluoroscopically. Under ultrasound guidance, the right common femoral artery was accessed with a micropuncture kit after the overlying soft tissues were anesthetized with 1% lidocaine. An ultrasound image was  saved for  documentation purposes. The micropuncture sheath was exchanged for a 5 [place] vascular sheath over a Bentson wire. A closure arteriogram was performed through the side of the sheath confirming access within the right common femoral artery.  Over a Bentson wire, a Mickelson catheter was advanced to the level of the thoracic aorta where it was back bled and flushed. The catheter was then utilized to select the celiac artery and a celiac arteriogram was performed. 100 mcg of nitroglycerin was not administered intra-arterially through the celiac arterial vascular distribution.  With the use of a Fathom 14 microwire, a regular Renegade microcatheter was advanced into the common hepatic artery and a common hepatic arteriogram was performed. The microcatheter was then advanced into the distal aspect of the gastroduodenal artery and a sub selective gastroduodenal arteriogram was performed.  Initially, the distal tributaries of the GDA were embolized with 700-900 micron Embospheres. Limited post particle embolization arteriogram was performed. Subsequently, the gastroduodenal artery was coil embolized to near its origin with a combination of overlapping 2 and 3 mm diameter interlock coils.  The micro catheter was retracted into the common hepatic artery and a repeat common hepatic arteriogram was performed. The micro catheter was removed and a post embolization celiac arteriogram was performed. All images were reviewed and the procedure was terminated. All wires, catheters and sheaths were removed from the patient. Hemostasis was achieved at the right groin access site with manual compression.  ANESTHESIA/SEDATION: Fentanyl 200 mcg IV; Versed 4 mg IV  60 minutes  CONTRAST:  50 cc Omnipaque 300  COMPARISON:  None.  FLUOROSCOPY TIME:  11 min, 42 seconds  COMPLICATIONS: None immediate  FINDINGS: Celiac arteriogram demonstrates persistent irregular narrowing of both the common and proper hepatic arteries extending preferentially  involve the origin of the right hepatic artery.  The gastroduodenal artery remains patent though irregularly narrowed throughout its course.  The gastroduodenal artery was successfully cannulated and embolized with a combination of 700 to 900 micron Embospheres and overlapping interlock coils to near its origin. 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  IMPRESSION: 1. Successful prophylactic particle and coil embolization of the gastroduodenal artery secondary to recurrent and refractory upper GI (duodenal) bleed due to malignant invasion from patient's known pancreatic head carcinoma. 2. While the common and proper hepatic arteries remain irregularly narrowed throughout their course, I do not feel that these vessels are contributing to the patient's current hemodynamic instability and as such were not embolized.  PLAN: Pending the patient's wishes, would recommend continued aggressive conservative management. 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   Electronically Signed   By: Simonne Come M.D.   On: 02/18/2013 13:58   Ir Angiogram Selective Each Additional Vessel  02/18/2013   CLINICAL DATA:  57 year old with metastatic pancreatic cancer. Acute and recurrent upper GI bleeding. The patient has had previous duodenal bleeding based on endoscopy.  EXAM: VISCERAL ARTERIOGRAPHY ; SELECTIVE VISCERAL ARTERIOGRAPHY ; ULTRASOUND GUIDANCE FOR VASCULAR ACCESS  Physician: Rachelle Hora. Henn, MD  MEDICATIONS: Versed 5 mg, fentanyl 100 mcg. A radiology nurse monitored  the patient for moderate sedation.  ANESTHESIA/SEDATION:  Moderate sedation time: 1 hr and 45 min  CONTRAST:  85 mL Omnipaque 300  FLUOROSCOPY TIME:  29 min and 30 seconds  PROCEDURE: The procedure was explained to the patient. The risks and benefits of the procedure were discussed and the patient's questions were addressed. Informed consent was obtained from the patient. Patient was brought to the interventional suite. The right groin was prepped and draped in sterile fashion. Maximal barrier sterile technique was utilized including caps, mask, sterile gowns, sterile gloves, sterile drape, hand hygiene and skin antiseptic. The skin was anesthetized with 1% lidocaine. Using ultrasound guidance, a 21 gauge needle was directed into the right common femoral artery and a micropuncture dilator set was placed. A 5 French vascular sheath was placed over a Bentson wire. The superior mesenteric artery was cannulated with a C2 catheter. Superior mesenteric arteriogram was performed. The C2 catheter was used to cannulate the celiac trunk. Celiac arteriography was performed. A Glidewire was advanced into the common hepatic artery and the C2 catheter was advanced into the proximal common hepatic artery. At this point, there appeared to be extensive spasm in the common hepatic artery. C2 catheter was pulled back to the celiac trunk origin and additional angiography was performed. Multiple attempts were made to cannulate the common hepatic artery with a Renegade micro catheter. Cannulating the common hepatic artery was very difficult with the micro catheter. As result, the C2 catheter was exchanged for a Sos catheter. Eventually, a glide micro wire was advanced into the common hepatic artery and proper hepatic artery. The Renegade catheter was advanced to the common hepatic artery and additional angiography was performed. Catheter was also advanced into the proper hepatic artery and additional angiography was performed. Multiple  attempts were made to cannulate the gastroduodenal artery with a wire and catheter. These attempts at cannulating the gastroduodenal artery were unsuccessful. The micro catheter and 5 French catheter were removed. Final angiogram was performed through the right groin sheath. The groin sheath was removed with an Exoseal closure device. There was groin hemostasis at the end of the procedure.  COMPLICATIONS: None  FINDINGS: Superior mesenteric arteriogram: Patient has a metallic biliary stent and IVC filter. Patient has a large replaced right hepatic artery that originates near the origin of the superior mesenteric artery. There are small pancreatic-duodenal branches but no evidence for a vascular malformation or contrast extravasation. The delayed images demonstrate patency of the superior mesenteric veins but there is marked attenuation and narrowing of the portal confluence and main portal vein. The intrahepatic portal venous system appears to be patent.  Celiac arteriography: The splenic artery is large and patent. There is a prominent left gastric artery with filling of the right gastric artery. There is mild narrowing and irregularity at the common hepatic artery and proper hepatic artery. There appears to be irregularity of the gastroduodenal artery. There is narrowing involving the origin of the right hepatic artery. There is no evidence for acute extravasation. After a wire was advanced into the common hepatic artery, there was marked narrowing of the common hepatic artery suggestive for vasospasm and minimal flow into the gastroduodenal artery. At one point, there appeared to be retrograde flow into the gastroduodenal artery. After the vasospasm resolved, the flow to the common hepatic artery and gastroduodenal artery returned to baseline.  IMPRESSION: Irregularity of the common hepatic artery, gastroduodenal artery and proper hepatic artery. These findings are related to the surrounding tumor burden. There  was extensive vasospasm in the common hepatic artery during this  procedure but no evidence for acute bleeding or contrast extravasation. The gastroduodenal artery could never be successfully cannulated and, therefore, an empiric gastroduodenal embolization could not be performed.  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   Electronically Signed   By: Richarda Overlie M.D.   On: 02/18/2013 10:29   Ir Angiogram Selective Each Additional Vessel  02/18/2013   CLINICAL DATA:  58 year old with metastatic pancreatic cancer. Acute and recurrent upper GI bleeding. The patient has had previous duodenal bleeding based on endoscopy.  EXAM: VISCERAL ARTERIOGRAPHY ; SELECTIVE VISCERAL ARTERIOGRAPHY ; ULTRASOUND GUIDANCE FOR VASCULAR ACCESS  Physician: Rachelle Hora. Henn, MD  MEDICATIONS: Versed 5 mg, fentanyl 100 mcg. A radiology nurse monitored the patient for moderate sedation.  ANESTHESIA/SEDATION: Moderate sedation time: 1 hr and 45 min  CONTRAST:  85 mL Omnipaque 300  FLUOROSCOPY TIME:  29 min and 30 seconds  PROCEDURE: The procedure was explained to the patient. The risks and benefits of the procedure were discussed and the patient's questions were addressed. Informed consent was obtained from the patient. Patient was brought to the interventional suite. The right groin was prepped and draped in sterile fashion. Maximal barrier sterile technique was utilized including caps, mask, sterile gowns, sterile gloves, sterile drape, hand hygiene and skin antiseptic. The skin was anesthetized with 1% lidocaine. Using ultrasound guidance, a 21 gauge needle was directed into the right common femoral artery and a micropuncture dilator set was placed. A 5 French vascular sheath was placed over a Bentson wire. The superior mesenteric artery was cannulated with a C2 catheter. Superior mesenteric arteriogram was performed. The C2 catheter was used to cannulate the celiac trunk. Celiac arteriography was  performed. A Glidewire was advanced into the common hepatic artery and the C2 catheter was advanced into the proximal common hepatic artery. At this point, there appeared to be extensive spasm in the common hepatic artery. C2 catheter was pulled back to the celiac trunk origin and additional angiography was performed. Multiple attempts were made to cannulate the common hepatic artery with a Renegade micro catheter. Cannulating the common hepatic artery was very difficult with the micro catheter. As result, the C2 catheter was exchanged for a Sos catheter. Eventually, a glide micro wire was advanced into the common hepatic artery and proper hepatic artery. The Renegade catheter was advanced to the common hepatic artery and additional angiography was performed. Catheter was also advanced into the proper hepatic artery and additional angiography was performed. Multiple attempts were made to cannulate the gastroduodenal artery with a wire and catheter. These attempts at cannulating the gastroduodenal artery were unsuccessful. The micro catheter and 5 French catheter were removed. Final angiogram was performed through the right groin sheath. The groin sheath was removed with an Exoseal closure device. There was groin hemostasis at the end of the procedure.  COMPLICATIONS: None  FINDINGS: Superior mesenteric arteriogram: Patient has a metallic biliary stent and IVC filter. Patient has a large replaced right hepatic artery that originates near the origin of the superior mesenteric artery. There are small pancreatic-duodenal branches but no evidence for a vascular malformation or contrast extravasation. The delayed images demonstrate patency of the superior mesenteric veins but there is marked attenuation and narrowing of the portal confluence and main portal vein. The intrahepatic portal venous system appears to be patent.  Celiac arteriography: The splenic artery is large and patent. There is a prominent left gastric  artery with filling of the right gastric artery. There is mild  narrowing and irregularity at the common hepatic artery and proper hepatic artery. There appears to be irregularity of the gastroduodenal artery. There is narrowing involving the origin of the right hepatic artery. There is no evidence for acute extravasation. After a wire was advanced into the common hepatic artery, there was marked narrowing of the common hepatic artery suggestive for vasospasm and minimal flow into the gastroduodenal artery. At one point, there appeared to be retrograde flow into the gastroduodenal artery. After the vasospasm resolved, the flow to the common hepatic artery and gastroduodenal artery returned to baseline.  IMPRESSION: Irregularity of the common hepatic artery, gastroduodenal artery and proper hepatic artery. These findings are related to the surrounding tumor burden. There was extensive vasospasm in the common hepatic artery during this procedure but no evidence for acute bleeding or contrast extravasation. The gastroduodenal artery could never be successfully cannulated and, therefore, an empiric gastroduodenal embolization could not be performed.  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   Electronically Signed   By: Richarda Overlie M.D.   On: 02/18/2013 10:29   Ir Ivc Filter Plmt / S&i /img Guid/mod Sed  02/11/2013   CLINICAL DATA:  Metastatic pancreatic carcinoma. History of pulmonary embolism. GI bleeding, a relative contraindication to anticoagulation.  EXAM: INFERIOR VENACAVOGRAM  IVC FILTER PLACEMENT UNDER FLUOROSCOPY  TECHNIQUE: The procedure, risks (including but not limited to bleeding, infection, organ damage ), benefits, and alternatives were explained to the patient. Questions regarding the procedure were encouraged and answered. The patient understands and consents to the procedure. Caval anatomy reviewed on previous CT abdomen. Patency of the right IJ vein was  confirmed with ultrasound with image documentation. An appropriate skin site was determined. Skin site was marked, prepped with chlorhexidine, and draped using maximum barrier technique. The region was infiltrated locally with 1% lidocaine.  Intravenous Fentanyl and Versed were administered as conscious sedation during continuous cardiorespiratory monitoring by the radiology RN, with a total moderate sedation time of less than 30 minutes.  Under real-time ultrasound guidance, the right IJ vein was accessed with a 21 gauge micropuncture needle; the needle tip within the vein was confirmed with ultrasound image documentation. The needle was exchanged over a 018 guidewire for a transitional dilator, which allow advancement of the [facility] wire into the IVC. A long 6 French vascular sheath was placed for inferior venacavography. This demonstrated no caval thrombus. Renal vein inflows were evident.  The [facility] IVC filter was advanced through the sheath and successfully deployed under fluoroscopy at the L2-3 disc level. Followup cavagram demonstrates stable filter position and no evident complication. The sheath was removed and hemostasis achieved at the site. No immediate complication.  FLUOROSCOPY TIME:  36 seconds  IMPRESSION: 1. Normal IVC. No thrombus or significant anatomic variation. 2. Technically successful infrarenal IVC filter placement. This is a retrievable model.   Electronically Signed   By: Oley Balm M.D.   On: 02/11/2013 13:43   Ir US Guide Vasc Access Right  02/18/2013   INDICATION: History of metastatic pancreatic cancer, now with acute and recurrent upper GI bleeding from the duodenum based on recent endoscopy. Post failed attempted endoscopic duodenal stent placement and mesenteric arteriogram  EXAM: 1.  CELIAC ARTERIOGRAM  2.  COMMON HEPATIC ARTERIOGRAM (3RD ORDER).  3. SUB SELECTIVE GASTRODUODENAL ARTERIOGRAM (3RD ORDER) WITH PARTICLE AND COIL EMBOLIZATION  4.  ULTRASOUND GUIDANCE FOR  ARTERIAL ACCESS  MEDICATIONS: Nitroglycerin 100 mcg IA  TECHNIQUE: Informed written consent was obtained from  the patient after a discussion of the risks, benefits and alternatives to treatment. Questions regarding the procedure were encouraged and answered. A timeout was performed prior to the initiation of the procedure.  The right groin was prepped and draped in the usual sterile fashion, and a sterile drape was applied covering the operative field. Maximum barrier sterile technique with sterile gowns and gloves were used for the procedure. A timeout was performed prior to the initiation of the procedure. Local anesthesia was provided with 1% lidocaine.  The right femoral head was marked fluoroscopically. Under ultrasound guidance, the right common femoral artery was accessed with a micropuncture kit after the overlying soft tissues were anesthetized with 1% lidocaine. An ultrasound image was saved for documentation purposes. The micropuncture sheath was exchanged for a 5 [place] vascular sheath over a Bentson wire. A closure arteriogram was performed through the side of the sheath confirming access within the right common femoral artery.  Over a Bentson wire, a Mickelson catheter was advanced to the level of the thoracic aorta where it was back bled and flushed. The catheter was then utilized to select the celiac artery and a celiac arteriogram was performed. 100 mcg of nitroglycerin was not administered intra-arterially through the celiac arterial vascular distribution.  With the use of a Fathom 14 microwire, a regular Renegade microcatheter was advanced into the common hepatic artery and a common hepatic arteriogram was performed. The microcatheter was then advanced into the distal aspect of the gastroduodenal artery and a sub selective gastroduodenal arteriogram was performed.  Initially, the distal tributaries of the GDA were embolized with 700-900 micron Embospheres. Limited post particle embolization  arteriogram was performed. Subsequently, the gastroduodenal artery was coil embolized to near its origin with a combination of overlapping 2 and 3 mm diameter interlock coils.  The micro catheter was retracted into the common hepatic artery and a repeat common hepatic arteriogram was performed. The micro catheter was removed and a post embolization celiac arteriogram was performed. All images were reviewed and the procedure was terminated. All wires, catheters and sheaths were removed from the patient. Hemostasis was achieved at the right groin access site with manual compression.  ANESTHESIA/SEDATION: Fentanyl 200 mcg IV; Versed 4 mg IV  60 minutes  CONTRAST:  50 cc Omnipaque 300  COMPARISON:  None.  FLUOROSCOPY TIME:  11 min, 42 seconds  COMPLICATIONS: None immediate  FINDINGS: Celiac arteriogram demonstrates persistent irregular narrowing of both the common and proper hepatic arteries extending preferentially involve the origin of the right hepatic artery.  The gastroduodenal artery remains patent though irregularly narrowed throughout its course.  The gastroduodenal artery was successfully cannulated and embolized with a combination of 700 to 900 micron Embospheres and overlapping interlock coils to near its origin. 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  IMPRESSION: 1. Successful prophylactic particle and coil embolization of the gastroduodenal artery secondary to recurrent and refractory upper GI (duodenal) bleed due to malignant invasion from patient's known pancreatic head carcinoma. 2. While the common and proper hepatic arteries remain irregularly narrowed throughout their course, I do not feel that these vessels are contributing to the patient's current hemodynamic instability and as such were not embolized.  PLAN: Pending the patient's wishes, would recommend continued aggressive conservative management. 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   Electronically Signed   By: Simonne Come M.D.   On: 02/18/2013 13:58   Ir US Guide Vasc Access Right  02/18/2013   CLINICAL DATA:  57 year old with metastatic pancreatic cancer. Acute and recurrent upper GI bleeding. The patient has had previous duodenal bleeding based on endoscopy.  EXAM: VISCERAL ARTERIOGRAPHY ; SELECTIVE VISCERAL ARTERIOGRAPHY ; ULTRASOUND GUIDANCE FOR VASCULAR ACCESS  Physician: Rachelle Hora. Henn, MD  MEDICATIONS: Versed 5 mg, fentanyl 100 mcg. A radiology nurse monitored the patient for moderate sedation.  ANESTHESIA/SEDATION: Moderate sedation time: 1 hr and 45 min  CONTRAST:  85 mL Omnipaque 300  FLUOROSCOPY TIME:  29 min and 30 seconds  PROCEDURE: The procedure was explained to the patient. The risks and benefits of the procedure were discussed and the patient's questions were addressed. Informed consent was obtained from the patient. Patient was brought to the interventional suite. The right groin was prepped and draped in sterile fashion. Maximal barrier sterile technique was utilized including caps, mask, sterile gowns, sterile gloves, sterile drape, hand hygiene and skin antiseptic. The skin was anesthetized with 1% lidocaine. Using ultrasound guidance, a 21 gauge needle was directed into the right common femoral artery and a micropuncture dilator set was placed. A 5 French vascular sheath was placed over a Bentson wire. The superior mesenteric artery was cannulated with a C2 catheter. Superior mesenteric arteriogram was performed. The C2 catheter was used to cannulate the celiac  trunk. Celiac arteriography was performed. A Glidewire was advanced into the common hepatic artery and the C2 catheter was advanced into the proximal common hepatic artery. At this point, there appeared to be extensive spasm in the common hepatic artery. C2 catheter was pulled back to the celiac trunk origin and additional angiography was performed. Multiple attempts were made to cannulate the common hepatic artery with a Renegade micro catheter. Cannulating the common hepatic artery was very difficult with the micro catheter. As result, the C2 catheter was exchanged for a Sos catheter. Eventually, a glide micro wire was advanced into the common hepatic artery and proper hepatic artery. The Renegade catheter was advanced to the common hepatic artery and additional angiography was performed. Catheter was also advanced into the proper hepatic artery and additional angiography was performed. Multiple attempts were made to cannulate the gastroduodenal artery with a wire and catheter. These attempts at cannulating the gastroduodenal artery were unsuccessful. The micro catheter and 5 French catheter were removed. Final angiogram was performed through the right groin sheath. The groin sheath was removed with an Exoseal closure device. There was groin hemostasis at the end of the procedure.  COMPLICATIONS: None  FINDINGS: Superior mesenteric arteriogram: Patient has a metallic biliary stent and IVC filter. Patient has a large replaced right hepatic artery that originates near the origin of the superior mesenteric artery. There are small pancreatic-duodenal branches but no evidence for a vascular malformation or contrast extravasation. The delayed images demonstrate patency of the superior mesenteric veins but there is marked attenuation and narrowing of the portal confluence and main portal vein. The intrahepatic portal venous system appears to be patent.  Celiac arteriography: The splenic artery is large and patent. There  is a prominent left gastric artery with filling of the right gastric artery. There is mild narrowing and irregularity at the common hepatic artery and proper  hepatic artery. There appears to be irregularity of the gastroduodenal artery. There is narrowing involving the origin of the right hepatic artery. There is no evidence for acute extravasation. After a wire was advanced into the common hepatic artery, there was marked narrowing of the common hepatic artery suggestive for vasospasm and minimal flow into the gastroduodenal artery. At one point, there appeared to be retrograde flow into the gastroduodenal artery. After the vasospasm resolved, the flow to the common hepatic artery and gastroduodenal artery returned to baseline.  IMPRESSION: Irregularity of the common hepatic artery, gastroduodenal artery and proper hepatic artery. These findings are related to the surrounding tumor burden. There was extensive vasospasm in the common hepatic artery during this procedure but no evidence for acute bleeding or contrast extravasation. The gastroduodenal artery could never be successfully cannulated and, therefore, an empiric gastroduodenal embolization could not be performed.  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   Electronically Signed   By: Richarda Overlie M.D.   On: 02/18/2013 10:29   Dg C-arm 1-60 Min-no Report  02/16/2013   CLINICAL DATA: stent placement   C-ARM 1-60 MINUTES  Fluoroscopy was utilized by the requesting physician.  No radiographic  interpretation.    Ir Rebekah [place] [facility] [employer] Guide Roadmapping  02/18/2013   INDICATION: History of metastatic pancreatic cancer, now with acute and recurrent upper GI bleeding from the duodenum based on recent endoscopy. Post failed attempted endoscopic duodenal stent placement and mesenteric arteriogram  EXAM: 1.  CELIAC ARTERIOGRAM  2.  COMMON HEPATIC ARTERIOGRAM (3RD ORDER).  3. SUB SELECTIVE GASTRODUODENAL  ARTERIOGRAM (3RD ORDER) WITH PARTICLE AND COIL EMBOLIZATION  4.  ULTRASOUND GUIDANCE FOR ARTERIAL ACCESS  MEDICATIONS: Nitroglycerin 100 mcg IA  TECHNIQUE: Informed written consent was obtained from the patient after a discussion of the risks, benefits and alternatives to treatment. Questions regarding the procedure were encouraged and answered. A timeout was performed prior to the initiation of the procedure.  The right groin was prepped and draped in the usual sterile fashion, and a sterile drape was applied covering the operative field. Maximum barrier sterile technique with sterile gowns and gloves were used for the procedure. A timeout was performed prior to the initiation of the procedure. Local anesthesia was provided with 1% lidocaine.  The right femoral head was marked fluoroscopically. Under ultrasound guidance, the right common femoral artery was accessed with a micropuncture kit after the overlying soft tissues were anesthetized with 1% lidocaine. An ultrasound image was saved for documentation purposes. The micropuncture sheath was exchanged for a 5 [place] vascular sheath over a Bentson wire. A closure arteriogram was performed through the side of the sheath confirming access within the right common femoral artery.  Over a Bentson wire, a Mickelson catheter was advanced to the level of the thoracic aorta where it was back bled and flushed. The catheter was then utilized to select the celiac artery and a celiac arteriogram was performed. 100 mcg of nitroglycerin was not administered intra-arterially through the celiac arterial vascular distribution.  With the use of a Fathom 14 microwire, a regular Renegade microcatheter was advanced into the common hepatic artery and a common hepatic arteriogram was performed. The microcatheter was then advanced into the distal aspect of the gastroduodenal artery and a sub selective gastroduodenal arteriogram was performed.  Initially, the distal tributaries of the GDA  were embolized with 700-900 micron Embospheres. Limited post particle embolization arteriogram was performed. Subsequently, the gastroduodenal  artery was coil embolized to near its origin with a combination of overlapping 2 and 3 mm diameter interlock coils.  The micro catheter was retracted into the common hepatic artery and a repeat common hepatic arteriogram was performed. The micro catheter was removed and a post embolization celiac arteriogram was performed. All images were reviewed and the procedure was terminated. All wires, catheters and sheaths were removed from the patient. Hemostasis was achieved at the right groin access site with manual compression.  ANESTHESIA/SEDATION: Fentanyl 200 mcg IV; Versed 4 mg IV  60 minutes  CONTRAST:  50 cc Omnipaque 300  COMPARISON:  None.  FLUOROSCOPY TIME:  11 min, 42 seconds  COMPLICATIONS: None immediate  FINDINGS: Celiac arteriogram demonstrates persistent irregular narrowing of both the common and proper hepatic arteries extending preferentially involve the origin of the right hepatic artery.  The gastroduodenal artery remains patent though irregularly narrowed throughout its course.  The gastroduodenal artery was successfully cannulated and embolized with a combination of 700 to 900 micron Embospheres and overlapping interlock coils to near its origin. 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  IMPRESSION: 1. Successful prophylactic particle and coil embolization of the gastroduodenal artery secondary to recurrent and refractory upper GI (duodenal) bleed due to malignant invasion from patient's known pancreatic head carcinoma. 2. While the common and proper hepatic arteries remain irregularly narrowed throughout their course, I do not feel that these vessels are contributing to the patient's current hemodynamic instability and as such were not embolized.  PLAN: Pending the patient's wishes, would recommend continued  aggressive conservative management. 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   Electronically Signed   By: Simonne Come M.D.   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 CT angiography is performed to assess vasculature prior to potential arteriography and transcatheter embolization to treat persistent bleeding.  EXAM: CT ANGIOGRAPHY ABDOMEN AND PELVIS WITH CONTRAST AND WITHOUT CONTRAST  TECHNIQUE: Multidetector CT imaging of the abdomen and pelvis was performed using the standard protocol during bolus administration of intravenous contrast. Multiplanar reconstructed images including MIPs were obtained and reviewed to evaluate the vascular anatomy.  CONTRAST:  OMNIPAQUE IOHEXOL 350 MG/ML SOLN  COMPARISON:  12/23/2012  FINDINGS: There is significant enlargement of the mass involving the head of the pancreas since the prior CT in October. Dimensions of the mass are now approximately 3.7 x 5.6 x 4.5 cm. The mass visibly encases the pre-existing biliary stent and also extends further to encase the gastroduodenal artery, portal venous confluence, hepatic artery and a segment of the superior mesenteric vein. 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 Portal flow is present in the right lobe.  Significant progression of metastatic  disease in the liver is also identified in both left and right lobes. Index lesion in the inferior right lobe shows enlargement with dimensions of 2.5 x  3.5 cm (1.6 x 2.1 cm previously). The largest left lobe lesion has enlarged from a maximum diameter of 2.4 cm previously to estimated current maximum diameter of 4.0 cm. All previously identified metastatic lesions have increased significantly in size. There are at least 4 new metastatic lesions identified in the liver. There is no evidence of associated biliary obstruction with air remaining a biliary tree. The common bile duct stent lumen does not appear to be compromised.  There is new ascites with mild to moderate overall volume of ascites present as well as nodularity of the mesenteric and omentum suspicious for carcinomatosis. No bowel obstruction or perforation is identified.  Arterial vasculature evaluated by CTA shows no evidence of significant occlusive disease. The aorta and major visceral branches are normally patent. At the level of the celiac trunk, the common hepatic artery is narrowed and encased by tumor but remains open with flow noted in both right and left hepatic arteries. The gastroduodenal artery is open but encased by tumor. Small distal GDA branches are present supplying the pancreatic mass. There also appears to be pancreaticoduodenal supply from the SMA at the level of the pancreatic mass.  The superior mesenteric artery abuts tumor but is not visibly narrowed. Focal nonocclusive thrombus or direct tumor invasion is identified in the superior aspect of the superior mesenteric vein causing luminal stenosis of approximately 60%. The splenic vein remains open.  Bilateral renal artery show normal patency. The inferior mesenteric vein is open. Bilateral iliac and common femoral arteries show normal patency.  Appearance of the gallbladder and kidneys are unremarkable. The spleen shows a new focal wedge-shaped infarct in its posterior and  inferior aspect. The rest of the spleen shows normal perfusion. There is a stable left adrenal nodule. Bony structures show degenerative changes of the spine without evidence of focal lesions.  Review of the MIP images confirms the above findings.  IMPRESSION: 1. Significant progression of metastatic pancreatic carcinoma with enlargement of the mass at the level of the head of the pancreas and progression of hepatic metastatic disease. 2. 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 Tumor also encases and narrows the common hepatic artery, right and left hepatic arteries and gastroduodenal artery. There also is tumor involvement at the level of the superior mesenteric vein. 3. 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   Electronically Signed   By: Irish Lack M.D.   On: 02/11/2013 10:34         Subjective: No complaints. States this is the best he has felt since he has been here. Had a BM earlier today without evidence of blood.  Objective: Filed Vitals:   02/19/13 0700 02/19/13 0800 02/19/13 1000 02/19/13 1200  BP: 152/96 130/87 143/90   Pulse:      Temp:  97.6 F (36.4 C)  97.9 F (36.6 C)  TempSrc:  Oral  Oral  Resp: 19 12 21    Height:      Weight:      SpO2: 99% 98% 100%     Intake/Output Summary (Last 24 hours) at 02/19/13 1242 Last data filed at 02/19/13 0600  Gross per 24 hour  Intake   3405 ml  Output      0 ml  Net   3405 ml   Weight change:  Exam:   General:  Pt is alert, follows commands appropriately, not in acute distress  HEENT: No  icterus, No thrush,  [place]/AT  Cardiovascular: RRR, S1/S2, no rubs, no gallops  Respiratory: CTA bilaterally, no wheezing, no crackles, no rhonchi  Abdomen: Soft/+BS, non tender, non distended, no guarding  Extremities: 1+ edema, No lymphangitis, No petechiae, No rashes, no synovitis  Data  Reviewed: Basic Metabolic Panel:  Recent Labs Lab 02/13/13 0500 02/14/13 0500 02/15/13 1036 02/17/13 1903 02/18/13 0130  NA 131* 135 133* 134* 139  K 3.7 3.4* 3.8 3.4* 3.6  CL 104 107 104 106 110  CO2 19 21 18* 21 22  GLUCOSE 123* 124* 135* 138* 106*  BUN 12 13 14 13 12   CREATININE 0.88 0.90 0.89 0.74 0.77  CALCIUM 8.2* 8.4 8.1* 8.1* 8.6  MG 1.8 1.9  --   --   --    Liver Function Tests:  Recent Labs Lab 02/13/13 0500 02/14/13 0500 02/15/13 1036  AST 25 22 25   ALT 21 22 21   ALKPHOS 76 85 81  BILITOT 0.3 0.5 0.4  PROT 4.6* 5.1* 5.0*  ALBUMIN 1.9* 2.3* 2.2*   No results found for this basename: LIPASE, AMYLASE,  in the last 168 hours No results found for this basename: AMMONIA,  in the last 168 hours CBC:  Recent Labs Lab 02/13/13 0500 02/14/13 0500  02/18/13 0810 02/18/13 1439 02/18/13 2210 02/19/13 0300 02/19/13 0800  WBC 5.6 6.8  < > 7.3 7.7 10.0 8.7 8.6  NEUTROABS 4.2 5.1  --   --   --   --   --   --   HGB 8.0* 9.0*  < > 7.6* 8.6* 9.5* 9.2* 9.5*  HCT 22.9* 26.3*  < > 22.2* 25.3* 28.3* 27.0* 27.6*  MCV 85.8 86.2  < > 88.1 88.5 87.1 87.9 87.9  PLT 49* 70*  < > 85* 88* 84* 85* 84*  < > = values in this interval not displayed. Cardiac Enzymes: No results found for this basename: CKTOTAL, CKMB, CKMBINDEX, TROPONINI,  in the last 168 hours BNP: No components found with this basename: POCBNP,  CBG: No results found for this basename: GLUCAP,  in the last 168 hours  Recent Results (from the past 240 hour(s))  CLOSTRIDIUM DIFFICILE BY PCR     Status: None   Collection Time    02/13/13  1:10 PM      Result Value Range Status   C difficile by pcr NEGATIVE  NEGATIVE Final   Comment: Performed at [facility]     Scheduled Meds: . folic acid  2 mg Oral Daily  . lipase/protease/amylase  2 capsule Oral TID AC & HS  . pantoprazole (PROTONIX) IV  40 mg Intravenous Q24H  . phytonadione  10 mg Subcutaneous Daily  . sodium chloride  3 mL Intravenous  Q12H  . sucralfate  2 g Oral Q6H   Continuous Infusions: . sodium chloride Stopped (02/17/13 1915)  . sodium chloride 150 mL/hr at 02/18/13 2000     Chaya Jan, MD  [facility] Pager [phone]  If 7PM-7AM, please contact night-coverage [email or web address] Password [email or web address] 02/19/2013, 12:42 PM   LOS: 4 days

## 2013-02-19 NOTE — Progress Notes (Signed)
CARE MANAGEMENT NOTE 02/19/2013  Patient:  VITO, BEG   Account Number:  [id number]  Date Initiated:  02/16/2013  Documentation initiated by:  Kariann Wecker  Subjective/Objective Assessment:   pt readmitted due to recurrent varicles bleed/went to or for stent placement -attempt failed hgb 7.7 bld products being given.     Action/Plan:   home when stable   Anticipated DC Date:  02/22/2013   Anticipated DC Plan:  HOME/SELF CARE  In-house referral  NA      DC Planning Services  NA      [facility] Choice  NA   Choice offered to / List presented to:  NA   DME arranged  NA      DME agency  NA     HH arranged  NA      HH agency  NA   Status of service:  In process, will continue to follow Medicare Important Message given?  NA - LOS <3 / Initial given by admissions (If response is "NO", the following Medicare IM given date fields will be blank) Date Medicare IM given:   Date Additional Medicare IM given:    Discharge Disposition:    Per UR Regulation:  Reviewed for med. necessity/level of care/duration of stay  If discussed at Long Length of Stay Meetings, dates discussed:    Comments:  [id number]/Ashling Roane Stark Jock, BSN, [place] [phone] Chart Reviewed for discharge and hospital needs. Embolization performed on [id number] for continued bld. Received two unit of prbc post procedure,  hgb stable post infusion. Discharge needs at time of review:  None present will follow for needs. Review of patient progress due on [id number].   [id number]/YSAYTK Earlene Plater RN, BSN, [place] [phone] Chart Reviewed for discharge and hospital needs. Discharge needs at time of review:  None present will follow for needs. Review of patient progress due on [id number].

## 2013-02-20 LAB — CBC
HCT: 26.6 % — ABNORMAL LOW (ref 39.0–52.0)
Hemoglobin: 9 g/dL — ABNORMAL LOW (ref 13.0–17.0)
RBC: 3.01 MIL/uL — ABNORMAL LOW (ref 4.22–5.81)
WBC: 7.3 10*3/uL (ref 4.0–10.5)

## 2013-02-20 LAB — APTT: aPTT: 33 seconds (ref 24–37)

## 2013-02-20 LAB — PROTIME-INR: INR: 1.48 (ref 0.00–1.49)

## 2013-02-20 MED ORDER — PANTOPRAZOLE SODIUM 40 MG PO TBEC
80.0000 mg | DELAYED_RELEASE_TABLET | Freq: Every day | ORAL | Status: DC
  Administered 2013-02-20 – 2013-02-27 (×8): 80 mg via ORAL
  Filled 2013-02-20 (×8): qty 2

## 2013-02-20 NOTE — Progress Notes (Signed)
[name] is now up on 1300. Happy about this. He has a little more flexibility.  He's not noticed any bleeding when he has a bowel movement. His hemoglobin is holding steady.  He is eating regular food. He still is continuing with radiation therapy.  Is urinating quite a bit. He probably does not not need all of the IV fluids. I will decrease the rate.  Is not hurting. There is no shortness of breath. He just feels better overall.  His vital signs are stable. Temperature is 99.4. Blood pressure 134/78. Lungs are clear. Cardiac exam regular rate and rhythm. He has no murmurs rubs or bruits. Abdomen is soft. The might be some slight distention. Bowel sounds are slightly decreased. There is no mass. There is no palpable liver or spleen. Extremities shows trace edema.  Hopefully, the bleeding has stopped. He is managing to hold his hemoglobin fairly well. It is now 9. Platelet count is 84,000. White cell count 7.3.  I will check his PT and PTT.  Hopefully, if things look good over the weekend, he might be able to go home early next week after radiation.  I appreciate the outstanding care that he has received from all the doctors and staff!!  Lyndel Pleasure 8:28

## 2013-02-20 NOTE — Progress Notes (Signed)
PT Cancellation Note  Patient Details Name: Austin Escobar MRN: [id number] DOB: 12-02-1955   Cancelled Treatment:      Pt stated he is doing fine walking back forth to the bathroom, not unsteady or anything. He feels he will progress nicely without [place] and appreciates [place] checking in on him. He doesn't mind [place] checking back on Monday to see how he is doing. Right now he doesn't feel like walking down the hall because he is so swollen in BLEs and especially in genitale area. Let nurse know as well to check to see if there is something for this.    Don't hesitate to alert [place] if you need [place] to return before Monday, [phone]. We will check on Monday to see how he is doing.    Marella Bile 02/20/2013, 3:16 PM

## 2013-02-20 NOTE — Progress Notes (Signed)
Patient penile are is swollen and also his thighs, Dr. Archie Endo aware, no new order. On IVF  NS at 50cc/hr form 150 cc/ hr.- Hulda Marin RN

## 2013-02-20 NOTE — Progress Notes (Signed)
[facility] PROGRESS NOTE  MERRIT FRIESEN ZOX:[id number] DOB: 12/29/1955 DOA: 02/15/2013 PCP: [name], DO  Interim history  57 y.o. 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 Due to poor prognosis, patient was referred for possible trial at [facility] by Dr. [name] (oncologist), although not yet accepted. He was DNR/DNI. Readmit on 12/15 and wanted to change to FULL CODE. Recurrent GIB beginning on the evening of 12/14, with blood mixed with stool and then progressed with 3-4 episodes of frank bloody clots. He had subsequent dizziness. Hgb on admission was  7.3 (was 9.0 on discharge) , requiring 2 units of blood. He also received 2 units of FFP 02/16/2013 due to INR 1.7. He has had a bowel movement this morning without any visible blood. Dr. [name] attempted duodenal stent to assist with tamponade of bleed, but this was unsuccessful due to delivery system being too short.  Assessment/Plan: Acute blood loss anemia  -2 additional units PRBC 12/18 -Has received 7 units PRBCs during this admission; 2units FFP -Underwent embolization 12/18 of the GDA by IR 12/18. -Has not had any further bleeding today; in fact had a BM without blood. -appreciate GI and IR followup  -Avoid NSAIDs  -Hemoglobin has remained fairly stable. Patient is hemodynamically stable  -Full liquid diet for now  -origin of bleed likely from duodenal bulb where there is tumor invasion   GI bleed -As above.  Coagulopathy  -Received 2 units FFP 02/16/2013 when his INR was 1.7  -vitamin K given 12/18  Esophageal varices  -No bleeding noted from previous endoscopy  -Continue Protonix and Carafate   Saddle pulmonary embolus  -12/23/2012  -Status post IVC filter in December 2014  -Currently stable without any respiratory distress or hypoxemia   Atrial fibrillation  -Status post ablation 05/25/2012  -Currently in sinus rhythm  -Not a candidate for anticoagulation   Metastatic pancreatic adenocarcinoma with duodenal invasion  -Appreciate oncology followup  -continue radiation therapy. -Dr. [name] is following   -LIMITED CODE--NO CPR, NO SHOCK, NO INTUBATION, YES VASOPRESSOR MEDS  HTN  -Metoprolol tartrate is on hold due to soft blood pressures   Family Communication: Patient only Disposition Plan: Transfer to floor. Start PT/OT, mobilize more. Anticipate DC home after radiation on Monday.      Procedures/Studies: Ir Angiogram Visceral Selective  02/18/2013   INDICATION: History of metastatic pancreatic cancer, now with acute and recurrent upper GI bleeding  from the duodenum based on recent endoscopy. Post failed attempted endoscopic duodenal stent placement and mesenteric arteriogram  EXAM: 1.  CELIAC ARTERIOGRAM  2.  COMMON HEPATIC ARTERIOGRAM (3RD ORDER).  3. SUB SELECTIVE GASTRODUODENAL ARTERIOGRAM (3RD ORDER) WITH PARTICLE AND COIL EMBOLIZATION  4.  ULTRASOUND GUIDANCE FOR ARTERIAL ACCESS  MEDICATIONS: Nitroglycerin 100 mcg IA  TECHNIQUE: Informed written consent was obtained from the patient after a discussion of the risks, benefits and alternatives to treatment. Questions regarding the procedure were encouraged and answered. A timeout was performed prior to the initiation of the procedure.  The right groin was prepped and draped in the usual sterile fashion, and a sterile drape was applied covering the operative field. Maximum barrier sterile technique with sterile gowns and gloves were used for the procedure. A timeout was performed prior to the initiation of the procedure. Local anesthesia was provided with 1% lidocaine.  The right femoral head was marked fluoroscopically. Under ultrasound guidance, the right common femoral artery was accessed with a micropuncture kit after the overlying soft tissues were anesthetized with 1% lidocaine. An ultrasound image was saved for documentation purposes. The micropuncture sheath was exchanged for a 5 [place] vascular sheath over a Bentson wire. A closure arteriogram was performed through the side of the sheath confirming access within the right common femoral artery.  Over a Bentson wire, a Mickelson catheter was advanced to the level of the thoracic aorta where it was back bled and flushed. The catheter was then utilized to select the celiac artery and a celiac arteriogram was performed. 100 mcg of nitroglycerin was not administered intra-arterially through the celiac arterial vascular distribution.  With the use of a Fathom 14 microwire, a regular Renegade microcatheter was advanced into the common hepatic artery and a  common hepatic arteriogram was performed. The microcatheter was then advanced into the distal aspect of the gastroduodenal artery and a sub selective gastroduodenal arteriogram was performed.  Initially, the distal tributaries of the GDA were embolized with 700-900 micron Embospheres. Limited post particle embolization arteriogram was performed. Subsequently, the gastroduodenal artery was coil embolized to near its origin with a combination of overlapping 2 and 3 mm diameter interlock coils.  The micro catheter was retracted into the common hepatic artery and a repeat common hepatic arteriogram was performed. The micro catheter was removed and a post embolization celiac arteriogram was performed. All images were reviewed and the procedure was terminated. All wires, catheters and sheaths were removed from the patient. Hemostasis was achieved at the right groin access site with manual compression.  ANESTHESIA/SEDATION: Fentanyl 200 mcg IV; Versed 4 mg IV  60 minutes  CONTRAST:  50 cc Omnipaque 300  COMPARISON:  None.  FLUOROSCOPY TIME:  11 min, 42 seconds  COMPLICATIONS: None immediate  FINDINGS: Celiac arteriogram demonstrates persistent irregular narrowing of both the common and proper hepatic arteries extending preferentially involve the origin of the right hepatic artery.  The gastroduodenal artery remains patent though irregularly narrowed throughout its course.  The gastroduodenal artery was successfully cannulated and embolized with a combination of 700 to 900 micron Embospheres and overlapping interlock coils to near its origin. 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  IMPRESSION: 1. Successful prophylactic particle and coil embolization of the gastroduodenal artery secondary to recurrent and refractory upper GI (duodenal) bleed due to malignant invasion from patient's known pancreatic head carcinoma. 2. While the  common and proper hepatic arteries remain  irregularly narrowed throughout their course, I do not feel that these vessels are contributing to the patient's current hemodynamic instability and as such were not embolized.  PLAN: Pending the patient's wishes, would recommend continued aggressive conservative management. 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   Electronically Signed   By: Simonne Come M.D.   On: 02/18/2013 13:58   Ir Angiogram Visceral Selective  02/18/2013   CLINICAL DATA:  57 year old with metastatic pancreatic cancer. Acute and recurrent upper GI bleeding. The patient has had previous duodenal bleeding based on endoscopy.  EXAM: VISCERAL ARTERIOGRAPHY ; SELECTIVE VISCERAL ARTERIOGRAPHY ; ULTRASOUND GUIDANCE FOR VASCULAR ACCESS  Physician: Rachelle Hora. Henn, MD  MEDICATIONS: Versed 5 mg, fentanyl 100 mcg. A radiology nurse monitored the patient for moderate sedation.  ANESTHESIA/SEDATION: Moderate sedation time: 1 hr and 45 min  CONTRAST:  85 mL Omnipaque 300  FLUOROSCOPY TIME:  29 min and 30 seconds  PROCEDURE: The procedure was explained to the patient. The risks and benefits of the procedure were discussed and the patient's questions were addressed. Informed consent was obtained from the patient. Patient was brought to the interventional suite. The right groin was prepped and draped in sterile fashion. Maximal barrier sterile technique was utilized including caps, mask, sterile gowns, sterile gloves, sterile drape, hand hygiene and skin antiseptic. The skin was anesthetized with 1% lidocaine. Using ultrasound guidance, a 21 gauge  needle was directed into the right common femoral artery and a micropuncture dilator set was placed. A 5 French vascular sheath was placed over a Bentson wire. The superior mesenteric artery was cannulated with a C2 catheter. Superior mesenteric arteriogram was performed. The C2 catheter was used to cannulate the celiac trunk. Celiac arteriography was performed. A Glidewire was advanced into the common hepatic artery and the C2 catheter was advanced into the proximal common hepatic artery. At this point, there appeared to be extensive spasm in the common hepatic artery. C2 catheter was pulled back to the celiac trunk origin and additional angiography was performed. Multiple attempts were made to cannulate the common hepatic artery with a Renegade micro catheter. Cannulating the common hepatic artery was very difficult with the micro catheter. As result, the C2 catheter was exchanged for a Sos catheter. Eventually, a glide micro wire was advanced into the common hepatic artery and proper hepatic artery. The Renegade catheter was advanced to the common hepatic artery and additional angiography was performed. Catheter was also advanced into the proper hepatic artery and additional angiography was performed. Multiple attempts were made to cannulate the gastroduodenal artery with a wire and catheter. These attempts at cannulating the gastroduodenal artery were unsuccessful. The micro catheter and 5 French catheter were removed. Final angiogram was performed through the right groin sheath. The groin sheath was removed with an Exoseal closure device. There was groin hemostasis at the end of the procedure.  COMPLICATIONS: None  FINDINGS: Superior mesenteric arteriogram: Patient has a metallic biliary stent and IVC filter. Patient has a large replaced right hepatic artery that originates near the origin of the superior mesenteric artery. There are small pancreatic-duodenal branches  but no evidence for a vascular malformation  or contrast extravasation. The delayed images demonstrate patency of the superior mesenteric veins but there is marked attenuation and narrowing of the portal confluence and main portal vein. The intrahepatic portal venous system appears to be patent.  Celiac arteriography: The splenic artery is large and patent. There is a prominent left gastric artery with filling of the right gastric artery. There is mild narrowing and irregularity at the common hepatic artery and proper hepatic artery. There appears to be irregularity of the gastroduodenal artery. There is narrowing involving the origin of the right hepatic artery. There is no evidence for acute extravasation. After a wire was advanced into the common hepatic artery, there was marked narrowing of the common hepatic artery suggestive for vasospasm and minimal flow into the gastroduodenal artery. At one point, there appeared to be retrograde flow into the gastroduodenal artery. After the vasospasm resolved, the flow to the common hepatic artery and gastroduodenal artery returned to baseline.  IMPRESSION: Irregularity of the common hepatic artery, gastroduodenal artery and proper hepatic artery. These findings are related to the surrounding tumor burden. There was extensive vasospasm in the common hepatic artery during this procedure but no evidence for acute bleeding or contrast extravasation. The gastroduodenal artery could never be successfully cannulated and, therefore, an empiric gastroduodenal embolization could not be performed.  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   Electronically Signed   By: Richarda Overlie M.D.   On: 02/18/2013 10:29   Ir Angiogram Visceral Selective  02/18/2013   CLINICAL DATA:  57 year old with metastatic pancreatic cancer. Acute and recurrent upper GI bleeding. The patient has had previous duodenal bleeding based on endoscopy.  EXAM: VISCERAL ARTERIOGRAPHY ; SELECTIVE VISCERAL ARTERIOGRAPHY ;  ULTRASOUND GUIDANCE FOR VASCULAR ACCESS  Physician: Rachelle Hora. Henn, MD  MEDICATIONS: Versed 5 mg, fentanyl 100 mcg. A radiology nurse monitored the patient for moderate sedation.  ANESTHESIA/SEDATION: Moderate sedation time: 1 hr and 45 min  CONTRAST:  85 mL Omnipaque 300  FLUOROSCOPY TIME:  29 min and 30 seconds  PROCEDURE: The procedure was explained to the patient. The risks and benefits of the procedure were discussed and the patient's questions were addressed. Informed consent was obtained from the patient. Patient was brought to the interventional suite. The right groin was prepped and draped in sterile fashion. Maximal barrier sterile technique was utilized including caps, mask, sterile gowns, sterile gloves, sterile drape, hand hygiene and skin antiseptic. The skin was anesthetized with 1% lidocaine. Using ultrasound guidance, a 21 gauge needle was directed into the right common femoral artery and a micropuncture dilator set was placed. A 5 French vascular sheath was placed over a Bentson wire. The superior mesenteric artery was cannulated with a C2 catheter. Superior mesenteric arteriogram was performed. The C2 catheter was used to cannulate the celiac trunk. Celiac arteriography was performed. A Glidewire was advanced into the common hepatic artery and the C2 catheter was advanced into the proximal common hepatic artery. At this point, there appeared to be extensive spasm in the common hepatic artery. C2 catheter was pulled back to the celiac trunk origin and additional angiography was performed. Multiple attempts were made to cannulate the common hepatic artery with a Renegade micro catheter. Cannulating the common hepatic artery was very difficult with the micro catheter. As result, the C2 catheter was exchanged for a Sos catheter. Eventually, a glide micro wire was advanced into the common hepatic artery and proper hepatic artery.  The Renegade catheter was advanced to the common hepatic artery and  additional angiography was performed. Catheter was also advanced into the proper hepatic artery and additional angiography was performed. Multiple attempts were made to cannulate the gastroduodenal artery with a wire and catheter. These attempts at cannulating the gastroduodenal artery were unsuccessful. The micro catheter and 5 French catheter were removed. Final angiogram was performed through the right groin sheath. The groin sheath was removed with an Exoseal closure device. There was groin hemostasis at the end of the procedure.  COMPLICATIONS: None  FINDINGS: Superior mesenteric arteriogram: Patient has a metallic biliary stent and IVC filter. Patient has a large replaced right hepatic artery that originates near the origin of the superior mesenteric artery. There are small pancreatic-duodenal branches but no evidence for a vascular malformation or contrast extravasation. The delayed images demonstrate patency of the superior mesenteric veins but there is marked attenuation and narrowing of the portal confluence and main portal vein. The intrahepatic portal venous system appears to be patent.  Celiac arteriography: The splenic artery is large and patent. There is a prominent left gastric artery with filling of the right gastric artery. There is mild narrowing and irregularity at the common hepatic artery and proper hepatic artery. There appears to be irregularity of the gastroduodenal artery. There is narrowing involving the origin of the right hepatic artery. There is no evidence for acute extravasation. After a wire was advanced into the common hepatic artery, there was marked narrowing of the common hepatic artery suggestive for vasospasm and minimal flow into the gastroduodenal artery. At one point, there appeared to be retrograde flow into the gastroduodenal artery. After the vasospasm resolved, the flow to the common hepatic artery and gastroduodenal artery returned to baseline.  IMPRESSION: Irregularity  of the common hepatic artery, gastroduodenal artery and proper hepatic artery. These findings are related to the surrounding tumor burden. There was extensive vasospasm in the common hepatic artery during this procedure but no evidence for acute bleeding or contrast extravasation. The gastroduodenal artery could never be successfully cannulated and, therefore, an empiric gastroduodenal embolization could not be performed.  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   Electronically Signed   By: Richarda Overlie M.D.   On: 02/18/2013 10:29   Ir Angiogram Selective Each Additional Vessel  02/18/2013   INDICATION: History of metastatic pancreatic cancer, now with acute and recurrent upper GI bleeding from the duodenum based on recent endoscopy. Post failed attempted endoscopic duodenal stent placement and mesenteric arteriogram  EXAM: 1.  CELIAC ARTERIOGRAM  2.  COMMON HEPATIC ARTERIOGRAM (3RD ORDER).  3. SUB SELECTIVE GASTRODUODENAL ARTERIOGRAM (3RD ORDER) WITH PARTICLE AND COIL EMBOLIZATION  4.  ULTRASOUND GUIDANCE FOR ARTERIAL ACCESS  MEDICATIONS: Nitroglycerin 100 mcg IA  TECHNIQUE: Informed written consent was obtained from the patient after a discussion of the risks, benefits and alternatives to treatment. Questions regarding the procedure were encouraged and answered. A timeout was performed prior to the initiation of the procedure.  The right groin was prepped and draped in the usual sterile fashion, and a sterile drape was applied covering the operative field. Maximum barrier sterile technique with sterile gowns and gloves were used for the procedure. A timeout was performed prior to the initiation of the procedure. Local anesthesia was provided with 1% lidocaine.  The right femoral head was marked fluoroscopically. Under ultrasound guidance, the right common femoral artery was accessed with a micropuncture kit after the overlying soft tissues were anesthetized  with 1%  lidocaine. An ultrasound image was saved for documentation purposes. The micropuncture sheath was exchanged for a 5 [place] vascular sheath over a Bentson wire. A closure arteriogram was performed through the side of the sheath confirming access within the right common femoral artery.  Over a Bentson wire, a Mickelson catheter was advanced to the level of the thoracic aorta where it was back bled and flushed. The catheter was then utilized to select the celiac artery and a celiac arteriogram was performed. 100 mcg of nitroglycerin was not administered intra-arterially through the celiac arterial vascular distribution.  With the use of a Fathom 14 microwire, a regular Renegade microcatheter was advanced into the common hepatic artery and a common hepatic arteriogram was performed. The microcatheter was then advanced into the distal aspect of the gastroduodenal artery and a sub selective gastroduodenal arteriogram was performed.  Initially, the distal tributaries of the GDA were embolized with 700-900 micron Embospheres. Limited post particle embolization arteriogram was performed. Subsequently, the gastroduodenal artery was coil embolized to near its origin with a combination of overlapping 2 and 3 mm diameter interlock coils.  The micro catheter was retracted into the common hepatic artery and a repeat common hepatic arteriogram was performed. The micro catheter was removed and a post embolization celiac arteriogram was performed. All images were reviewed and the procedure was terminated. All wires, catheters and sheaths were removed from the patient. Hemostasis was achieved at the right groin access site with manual compression.  ANESTHESIA/SEDATION: Fentanyl 200 mcg IV; Versed 4 mg IV  60 minutes  CONTRAST:  50 cc Omnipaque 300  COMPARISON:  None.  FLUOROSCOPY TIME:  11 min, 42 seconds  COMPLICATIONS: None immediate  FINDINGS: Celiac arteriogram demonstrates persistent irregular narrowing of both the common and  proper hepatic arteries extending preferentially involve the origin of the right hepatic artery.  The gastroduodenal artery remains patent though irregularly narrowed throughout its course.  The gastroduodenal artery was successfully cannulated and embolized with a combination of 700 to 900 micron Embospheres and overlapping interlock coils to near its origin. 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  IMPRESSION: 1. Successful prophylactic particle and coil embolization of the gastroduodenal artery secondary to recurrent and refractory upper GI (duodenal) bleed due to malignant invasion from patient's known pancreatic head carcinoma. 2. While the common and proper hepatic arteries remain irregularly narrowed throughout their course, I do not feel that these vessels are contributing to the patient's current hemodynamic instability and as such were not embolized.  PLAN: Pending the patient's wishes, would recommend continued aggressive conservative management. 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   Electronically Signed   By: Simonne Come M.D.   On: 02/18/2013 13:58   Ir Angiogram Selective Each Additional Vessel  02/18/2013   CLINICAL DATA:  57 year old with metastatic pancreatic cancer. Acute and recurrent upper GI bleeding. The patient has had previous duodenal bleeding based on endoscopy.  EXAM: VISCERAL ARTERIOGRAPHY ; SELECTIVE VISCERAL ARTERIOGRAPHY ; ULTRASOUND GUIDANCE FOR VASCULAR ACCESS  Physician: Rachelle Hora. Henn, MD  MEDICATIONS: Versed 5  mg, fentanyl 100 mcg. A radiology nurse monitored  the patient for moderate sedation.  ANESTHESIA/SEDATION: Moderate sedation time: 1 hr and 45 min  CONTRAST:  85 mL Omnipaque 300  FLUOROSCOPY TIME:  29 min and 30 seconds  PROCEDURE: The procedure was explained to the patient. The risks and benefits of the procedure were discussed and the patient's questions were addressed. Informed consent was obtained from the patient. Patient was brought to the interventional suite. The right groin was prepped and draped in sterile fashion. Maximal barrier sterile technique was utilized including caps, mask, sterile gowns, sterile gloves, sterile drape, hand hygiene and skin antiseptic. The skin was anesthetized with 1% lidocaine. Using ultrasound guidance, a 21 gauge needle was directed into the right common femoral artery and a micropuncture dilator set was placed. A 5 French vascular sheath was placed over a Bentson wire. The superior mesenteric artery was cannulated with a C2 catheter. Superior mesenteric arteriogram was performed. The C2 catheter was used to cannulate the celiac trunk. Celiac arteriography was performed. A Glidewire was advanced into the common hepatic artery and the C2 catheter was advanced into the proximal common hepatic artery. At this point, there appeared to be extensive spasm in the common hepatic artery. C2 catheter was pulled back to the celiac trunk origin and additional angiography was performed. Multiple attempts were made to cannulate the common hepatic artery with a Renegade micro catheter. Cannulating the common hepatic artery was very difficult with the micro catheter. As result, the C2 catheter was exchanged for a Sos catheter. Eventually, a glide micro wire was advanced into the common hepatic artery and proper hepatic artery. The Renegade catheter was advanced to the common hepatic artery and additional angiography was performed. Catheter was also advanced into the proper hepatic artery and  additional angiography was performed. Multiple attempts were made to cannulate the gastroduodenal artery with a wire and catheter. These attempts at cannulating the gastroduodenal artery were unsuccessful. The micro catheter and 5 French catheter were removed. Final angiogram was performed through the right groin sheath. The groin sheath was removed with an Exoseal closure device. There was groin hemostasis at the end of the procedure.  COMPLICATIONS: None  FINDINGS: Superior mesenteric arteriogram: Patient has a metallic biliary stent and IVC filter. Patient has a large replaced right hepatic artery that originates near the origin of the superior mesenteric artery. There are small pancreatic-duodenal branches but no evidence for a vascular malformation or contrast extravasation. The delayed images demonstrate patency of the superior mesenteric veins but there is marked attenuation and narrowing of the portal confluence and main portal vein. The intrahepatic portal venous system appears to be patent.  Celiac arteriography: The splenic artery is large and patent. There is a prominent left gastric artery with filling of the right gastric artery. There is mild narrowing and irregularity at the common hepatic artery and proper hepatic artery. There appears to be irregularity of the gastroduodenal artery. There is narrowing involving the origin of the right hepatic artery. There is no evidence for acute extravasation. After a wire was advanced into the common hepatic artery, there was marked narrowing of the common hepatic artery suggestive for vasospasm and minimal flow into the gastroduodenal artery. At one point, there appeared to be retrograde flow into the gastroduodenal artery. After the vasospasm resolved, the flow to the common hepatic artery and gastroduodenal artery returned to baseline.  IMPRESSION: Irregularity of the common hepatic artery, gastroduodenal artery and proper hepatic artery. These findings are  related to the surrounding tumor burden. There was extensive vasospasm  in the common hepatic artery during this procedure but no evidence for acute bleeding or contrast extravasation. The gastroduodenal artery could never be successfully cannulated and, therefore, an empiric gastroduodenal embolization could not be performed.  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   Electronically Signed   By: Richarda Overlie M.D.   On: 02/18/2013 10:29   Ir Angiogram Selective Each Additional Vessel  02/18/2013   CLINICAL DATA:  57 year old with metastatic pancreatic cancer. Acute and recurrent upper GI bleeding. The patient has had previous duodenal bleeding based on endoscopy.  EXAM: VISCERAL ARTERIOGRAPHY ; SELECTIVE VISCERAL ARTERIOGRAPHY ; ULTRASOUND GUIDANCE FOR VASCULAR ACCESS  Physician: Rachelle Hora. Henn, MD  MEDICATIONS: Versed 5 mg, fentanyl 100 mcg. A radiology nurse monitored the patient for moderate sedation.  ANESTHESIA/SEDATION: Moderate sedation time: 1 hr and 45 min  CONTRAST:  85 mL Omnipaque 300  FLUOROSCOPY TIME:  29 min and 30 seconds  PROCEDURE: The procedure was explained to the patient. The risks and benefits of the procedure were discussed and the patient's questions were addressed. Informed consent was obtained from the patient. Patient was brought to the interventional suite. The right groin was prepped and draped in sterile fashion. Maximal barrier sterile technique was utilized including caps, mask, sterile gowns, sterile gloves, sterile drape, hand hygiene and skin antiseptic. The skin was anesthetized with 1% lidocaine. Using ultrasound guidance, a 21 gauge needle was directed into the right common femoral artery and a micropuncture dilator set was placed. A 5 French vascular sheath was placed over a Bentson wire. The superior mesenteric artery was cannulated with a C2 catheter. Superior mesenteric arteriogram was performed. The C2 catheter was used to cannulate the  celiac trunk. Celiac arteriography was performed. A Glidewire was advanced into the common hepatic artery and the C2 catheter was advanced into the proximal common hepatic artery. At this point, there appeared to be extensive spasm in the common hepatic artery. C2 catheter was pulled back to the celiac trunk origin and additional angiography was performed. Multiple attempts were made to cannulate the common hepatic artery with a Renegade micro catheter. Cannulating the common hepatic artery was very difficult with the micro catheter. As result, the C2 catheter was exchanged for a Sos catheter. Eventually, a glide micro wire was advanced into the common hepatic artery and proper hepatic artery. The Renegade catheter was advanced to the common hepatic artery and additional angiography was performed. Catheter was also advanced into the proper hepatic artery and additional angiography was performed. Multiple attempts were made to cannulate the gastroduodenal artery with a wire and catheter. These attempts at cannulating the gastroduodenal artery were unsuccessful. The micro catheter and 5 French catheter were removed. Final angiogram was performed through the right groin sheath. The groin sheath was removed with an Exoseal closure device. There was groin hemostasis at the end of the procedure.  COMPLICATIONS: None  FINDINGS: Superior mesenteric arteriogram: Patient has a metallic biliary stent and IVC filter. Patient has a large replaced right hepatic artery that originates near the origin of the superior mesenteric artery. There are small pancreatic-duodenal branches but no evidence for a vascular malformation or contrast extravasation. The delayed images demonstrate patency of the superior mesenteric veins but there is marked attenuation and narrowing of the portal confluence and main portal vein. The intrahepatic portal venous system appears to be patent.  Celiac arteriography: The splenic artery is large and patent.  There is a prominent left gastric artery with filling of  the right gastric artery. There is mild narrowing and irregularity at the common hepatic artery and proper hepatic artery. There appears to be irregularity of the gastroduodenal artery. There is narrowing involving the origin of the right hepatic artery. There is no evidence for acute extravasation. After a wire was advanced into the common hepatic artery, there was marked narrowing of the common hepatic artery suggestive for vasospasm and minimal flow into the gastroduodenal artery. At one point, there appeared to be retrograde flow into the gastroduodenal artery. After the vasospasm resolved, the flow to the common hepatic artery and gastroduodenal artery returned to baseline.  IMPRESSION: Irregularity of the common hepatic artery, gastroduodenal artery and proper hepatic artery. These findings are related to the surrounding tumor burden. There was extensive vasospasm in the common hepatic artery during this procedure but no evidence for acute bleeding or contrast extravasation. The gastroduodenal artery could never be successfully cannulated and, therefore, an empiric gastroduodenal embolization could not be performed.  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   Electronically Signed   By: Richarda Overlie M.D.   On: 02/18/2013 10:29   Ir Ivc Filter Plmt / S&i /img Guid/mod Sed  02/11/2013   CLINICAL DATA:  Metastatic pancreatic carcinoma. History of pulmonary embolism. GI bleeding, a relative contraindication to anticoagulation.  EXAM: INFERIOR VENACAVOGRAM  IVC FILTER PLACEMENT UNDER FLUOROSCOPY  TECHNIQUE: The procedure, risks (including but not limited to bleeding, infection, organ damage ), benefits, and alternatives were explained to the patient. Questions regarding the procedure were encouraged and answered. The patient understands and consents to the procedure. Caval anatomy reviewed on previous CT abdomen. Patency  of the right IJ vein was confirmed with ultrasound with image documentation. An appropriate skin site was determined. Skin site was marked, prepped with chlorhexidine, and draped using maximum barrier technique. The region was infiltrated locally with 1% lidocaine.  Intravenous Fentanyl and Versed were administered as conscious sedation during continuous cardiorespiratory monitoring by the radiology RN, with a total moderate sedation time of less than 30 minutes.  Under real-time ultrasound guidance, the right IJ vein was accessed with a 21 gauge micropuncture needle; the needle tip within the vein was confirmed with ultrasound image documentation. The needle was exchanged over a 018 guidewire for a transitional dilator, which allow advancement of the [facility] wire into the IVC. A long 6 French vascular sheath was placed for inferior venacavography. This demonstrated no caval thrombus. Renal vein inflows were evident.  The [facility] IVC filter was advanced through the sheath and successfully deployed under fluoroscopy at the L2-3 disc level. Followup cavagram demonstrates stable filter position and no evident complication. The sheath was removed and hemostasis achieved at the site. No immediate complication.  FLUOROSCOPY TIME:  36 seconds  IMPRESSION: 1. Normal IVC. No thrombus or significant anatomic variation. 2. Technically successful infrarenal IVC filter placement. This is a retrievable model.   Electronically Signed   By: Oley Balm M.D.   On: 02/11/2013 13:43   Ir US Guide Vasc Access Right  02/18/2013   INDICATION: History of metastatic pancreatic cancer, now with acute and recurrent upper GI bleeding from the duodenum based on recent endoscopy. Post failed attempted endoscopic duodenal stent placement and mesenteric arteriogram  EXAM: 1.  CELIAC ARTERIOGRAM  2.  COMMON HEPATIC ARTERIOGRAM (3RD ORDER).  3. SUB SELECTIVE GASTRODUODENAL ARTERIOGRAM (3RD ORDER) WITH PARTICLE AND COIL EMBOLIZATION  4.   ULTRASOUND GUIDANCE FOR ARTERIAL ACCESS  MEDICATIONS: Nitroglycerin 100 mcg IA  TECHNIQUE: Informed written consent was obtained from the patient after a discussion of the risks, benefits and alternatives to treatment. Questions regarding the procedure were encouraged and answered. A timeout was performed prior to the initiation of the procedure.  The right groin was prepped and draped in the usual sterile fashion, and a sterile drape was applied covering the operative field. Maximum barrier sterile technique with sterile gowns and gloves were used for the procedure. A timeout was performed prior to the initiation of the procedure. Local anesthesia was provided with 1% lidocaine.  The right femoral head was marked fluoroscopically. Under ultrasound guidance, the right common femoral artery was accessed with a micropuncture kit after the overlying soft tissues were anesthetized with 1% lidocaine. An ultrasound image was saved for documentation purposes. The micropuncture sheath was exchanged for a 5 [place] vascular sheath over a Bentson wire. A closure arteriogram was performed through the side of the sheath confirming access within the right common femoral artery.  Over a Bentson wire, a Mickelson catheter was advanced to the level of the thoracic aorta where it was back bled and flushed. The catheter was then utilized to select the celiac artery and a celiac arteriogram was performed. 100 mcg of nitroglycerin was not administered intra-arterially through the celiac arterial vascular distribution.  With the use of a Fathom 14 microwire, a regular Renegade microcatheter was advanced into the common hepatic artery and a common hepatic arteriogram was performed. The microcatheter was then advanced into the distal aspect of the gastroduodenal artery and a sub selective gastroduodenal arteriogram was performed.  Initially, the distal tributaries of the GDA were embolized with 700-900 micron Embospheres. Limited post  particle embolization arteriogram was performed. Subsequently, the gastroduodenal artery was coil embolized to near its origin with a combination of overlapping 2 and 3 mm diameter interlock coils.  The micro catheter was retracted into the common hepatic artery and a repeat common hepatic arteriogram was performed. The micro catheter was removed and a post embolization celiac arteriogram was performed. All images were reviewed and the procedure was terminated. All wires, catheters and sheaths were removed from the patient. Hemostasis was achieved at the right groin access site with manual compression.  ANESTHESIA/SEDATION: Fentanyl 200 mcg IV; Versed 4 mg IV  60 minutes  CONTRAST:  50 cc Omnipaque 300  COMPARISON:  None.  FLUOROSCOPY TIME:  11 min, 42 seconds  COMPLICATIONS: None immediate  FINDINGS: Celiac arteriogram demonstrates persistent irregular narrowing of both the common and proper hepatic arteries extending preferentially involve the origin of the right hepatic artery.  The gastroduodenal artery remains patent though irregularly narrowed throughout its course.  The gastroduodenal artery was successfully cannulated and embolized with a combination of 700 to 900 micron Embospheres and overlapping interlock coils to near its origin. 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  IMPRESSION: 1. Successful prophylactic particle and coil embolization of the gastroduodenal artery secondary to recurrent and refractory upper GI (duodenal) bleed due to malignant invasion from patient's known pancreatic head carcinoma. 2. While the common and proper hepatic arteries remain irregularly narrowed throughout their course, I do not feel that these vessels are contributing to the patient's current hemodynamic instability and as such were not embolized.  PLAN: Pending the patient's wishes, would recommend continued aggressive conservative management. 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   Electronically Signed   By: Simonne Come M.D.   On: 02/18/2013 13:58   Ir US Guide Vasc Access Right  02/18/2013   CLINICAL DATA:  57 year old with metastatic pancreatic cancer. Acute and recurrent upper GI bleeding. The patient has had previous duodenal bleeding based on endoscopy.  EXAM: VISCERAL ARTERIOGRAPHY ; SELECTIVE VISCERAL ARTERIOGRAPHY ; ULTRASOUND GUIDANCE FOR VASCULAR ACCESS  Physician: Rachelle Hora. Henn, MD  MEDICATIONS: Versed 5 mg, fentanyl 100 mcg. A radiology nurse monitored the patient for moderate sedation.  ANESTHESIA/SEDATION: Moderate sedation time: 1 hr and 45 min  CONTRAST:  85 mL Omnipaque 300  FLUOROSCOPY TIME:  29 min and 30 seconds  PROCEDURE: The procedure was explained to the patient. The risks and benefits of the procedure were discussed and the patient's questions were addressed. Informed consent was obtained from the patient. Patient was brought to the interventional suite. The right groin was prepped and draped in sterile fashion. Maximal barrier sterile technique was utilized including caps, mask, sterile gowns, sterile gloves, sterile drape, hand hygiene and skin antiseptic. The skin was anesthetized with 1% lidocaine. Using ultrasound guidance, a 21 gauge needle was directed into the right common femoral artery and a micropuncture dilator set was placed. A 5 French vascular sheath was placed over a Bentson wire. The superior mesenteric artery was cannulated with a C2 catheter. Superior mesenteric arteriogram was performed. The C2 catheter was used to  cannulate the celiac trunk. Celiac arteriography was performed. A Glidewire was advanced into the common hepatic artery and the C2 catheter was advanced into the proximal common hepatic artery. At this point, there appeared to be extensive spasm in the common hepatic artery. C2 catheter was pulled back to the celiac trunk origin and additional angiography was performed. Multiple attempts were made to cannulate the common hepatic artery with a Renegade micro catheter. Cannulating the common hepatic artery was very difficult with the micro catheter. As result, the C2 catheter was exchanged for a Sos catheter. Eventually, a glide micro wire was advanced into the common hepatic artery and proper hepatic artery. The Renegade catheter was advanced to the common hepatic artery and additional angiography was performed. Catheter was also advanced into the proper hepatic artery and additional angiography was performed. Multiple attempts were made to cannulate the gastroduodenal artery with a wire and catheter. These attempts at cannulating the gastroduodenal artery were unsuccessful. The micro catheter and 5 French catheter were removed. Final angiogram was performed through the right groin sheath. The groin sheath was removed with an Exoseal closure device. There was groin hemostasis at the end of the procedure.  COMPLICATIONS: None  FINDINGS: Superior mesenteric arteriogram: Patient has a metallic biliary stent and IVC filter. Patient has a large replaced right hepatic artery that originates near the origin of the superior mesenteric artery. There are small pancreatic-duodenal branches but no evidence for a vascular malformation or contrast extravasation. The delayed images demonstrate patency of the superior mesenteric veins but there is marked attenuation and narrowing of the portal confluence and main portal vein. The intrahepatic portal venous system appears to be patent.  Celiac arteriography: The splenic artery is  large and patent. There is a prominent left gastric artery with filling of the right gastric artery. There is mild narrowing and irregularity  at the common hepatic artery and proper hepatic artery. There appears to be irregularity of the gastroduodenal artery. There is narrowing involving the origin of the right hepatic artery. There is no evidence for acute extravasation. After a wire was advanced into the common hepatic artery, there was marked narrowing of the common hepatic artery suggestive for vasospasm and minimal flow into the gastroduodenal artery. At one point, there appeared to be retrograde flow into the gastroduodenal artery. After the vasospasm resolved, the flow to the common hepatic artery and gastroduodenal artery returned to baseline.  IMPRESSION: Irregularity of the common hepatic artery, gastroduodenal artery and proper hepatic artery. These findings are related to the surrounding tumor burden. There was extensive vasospasm in the common hepatic artery during this procedure but no evidence for acute bleeding or contrast extravasation. The gastroduodenal artery could never be successfully cannulated and, therefore, an empiric gastroduodenal embolization could not be performed.  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   Electronically Signed   By: Richarda Overlie M.D.   On: 02/18/2013 10:29   Dg C-arm 1-60 Min-no Report  02/16/2013   CLINICAL DATA: stent placement   C-ARM 1-60 MINUTES  Fluoroscopy was utilized by the requesting physician.  No radiographic  interpretation.    Ir Rebekah [place] [facility] [employer] Guide Roadmapping  02/18/2013   INDICATION: History of metastatic pancreatic cancer, now with acute and recurrent upper GI bleeding from the duodenum based on recent endoscopy. Post failed attempted endoscopic duodenal stent placement and mesenteric arteriogram  EXAM: 1.  CELIAC ARTERIOGRAM  2.  COMMON HEPATIC ARTERIOGRAM (3RD ORDER).  3. SUB  SELECTIVE GASTRODUODENAL ARTERIOGRAM (3RD ORDER) WITH PARTICLE AND COIL EMBOLIZATION  4.  ULTRASOUND GUIDANCE FOR ARTERIAL ACCESS  MEDICATIONS: Nitroglycerin 100 mcg IA  TECHNIQUE: Informed written consent was obtained from the patient after a discussion of the risks, benefits and alternatives to treatment. Questions regarding the procedure were encouraged and answered. A timeout was performed prior to the initiation of the procedure.  The right groin was prepped and draped in the usual sterile fashion, and a sterile drape was applied covering the operative field. Maximum barrier sterile technique with sterile gowns and gloves were used for the procedure. A timeout was performed prior to the initiation of the procedure. Local anesthesia was provided with 1% lidocaine.  The right femoral head was marked fluoroscopically. Under ultrasound guidance, the right common femoral artery was accessed with a micropuncture kit after the overlying soft tissues were anesthetized with 1% lidocaine. An ultrasound image was saved for documentation purposes. The micropuncture sheath was exchanged for a 5 [place] vascular sheath over a Bentson wire. A closure arteriogram was performed through the side of the sheath confirming access within the right common femoral artery.  Over a Bentson wire, a Mickelson catheter was advanced to the level of the thoracic aorta where it was back bled and flushed. The catheter was then utilized to select the celiac artery and a celiac arteriogram was performed. 100 mcg of nitroglycerin was not administered intra-arterially through the celiac arterial vascular distribution.  With the use of a Fathom 14 microwire, a regular Renegade microcatheter was advanced into the common hepatic artery and a common hepatic arteriogram was performed. The microcatheter was then advanced into the distal aspect of the gastroduodenal artery and a sub selective gastroduodenal arteriogram was performed.  Initially, the  distal tributaries of the GDA were embolized with 700-900 micron Embospheres. Limited post particle  embolization arteriogram was performed. Subsequently, the gastroduodenal artery was coil embolized to near its origin with a combination of overlapping 2 and 3 mm diameter interlock coils.  The micro catheter was retracted into the common hepatic artery and a repeat common hepatic arteriogram was performed. The micro catheter was removed and a post embolization celiac arteriogram was performed. All images were reviewed and the procedure was terminated. All wires, catheters and sheaths were removed from the patient. Hemostasis was achieved at the right groin access site with manual compression.  ANESTHESIA/SEDATION: Fentanyl 200 mcg IV; Versed 4 mg IV  60 minutes  CONTRAST:  50 cc Omnipaque 300  COMPARISON:  None.  FLUOROSCOPY TIME:  11 min, 42 seconds  COMPLICATIONS: None immediate  FINDINGS: Celiac arteriogram demonstrates persistent irregular narrowing of both the common and proper hepatic arteries extending preferentially involve the origin of the right hepatic artery.  The gastroduodenal artery remains patent though irregularly narrowed throughout its course.  The gastroduodenal artery was successfully cannulated and embolized with a combination of 700 to 900 micron Embospheres and overlapping interlock coils to near its origin. 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  IMPRESSION: 1. Successful prophylactic particle and coil embolization of the gastroduodenal artery secondary to recurrent and refractory upper GI (duodenal) bleed due to malignant invasion from patient's known pancreatic head carcinoma. 2. While the common and proper hepatic arteries remain irregularly narrowed throughout their course, I do not feel that these vessels are contributing to the patient's current hemodynamic instability and as such were not embolized.  PLAN: Pending the patient's  wishes, would recommend continued aggressive conservative management. 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   Electronically Signed   By: Simonne Come M.D.   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 CT angiography is performed to assess vasculature prior to potential arteriography and transcatheter embolization to treat persistent bleeding.  EXAM: CT ANGIOGRAPHY ABDOMEN AND PELVIS WITH CONTRAST AND WITHOUT CONTRAST  TECHNIQUE: Multidetector CT imaging of the abdomen and pelvis was performed using the standard protocol during bolus administration of intravenous contrast. Multiplanar reconstructed images including MIPs were obtained and reviewed to evaluate the vascular anatomy.  CONTRAST:  OMNIPAQUE IOHEXOL 350 MG/ML SOLN  COMPARISON:  12/23/2012  FINDINGS: There is significant enlargement of the mass involving the head of the pancreas since the prior CT in October. Dimensions of the mass are now approximately 3.7 x 5.6 x 4.5 cm. The mass visibly encases the pre-existing biliary stent and also extends further to encase the gastroduodenal artery, portal venous confluence, hepatic artery and a segment of the superior mesenteric vein. 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 Portal flow is present in the right lobe.   Significant progression of metastatic disease in the liver is also identified in both left and right lobes. Index lesion in the inferior right lobe  shows enlargement with dimensions of 2.5 x 3.5 cm (1.6 x 2.1 cm previously). The largest left lobe lesion has enlarged from a maximum diameter of 2.4 cm previously to estimated current maximum diameter of 4.0 cm. All previously identified metastatic lesions have increased significantly in size. There are at least 4 new metastatic lesions identified in the liver. There is no evidence of associated biliary obstruction with air remaining a biliary tree. The common bile duct stent lumen does not appear to be compromised.  There is new ascites with mild to moderate overall volume of ascites present as well as nodularity of the mesenteric and omentum suspicious for carcinomatosis. No bowel obstruction or perforation is identified.  Arterial vasculature evaluated by CTA shows no evidence of significant occlusive disease. The aorta and major visceral branches are normally patent. At the level of the celiac trunk, the common hepatic artery is narrowed and encased by tumor but remains open with flow noted in both right and left hepatic arteries. The gastroduodenal artery is open but encased by tumor. Small distal GDA branches are present supplying the pancreatic mass. There also appears to be pancreaticoduodenal supply from the SMA at the level of the pancreatic mass.  The superior mesenteric artery abuts tumor but is not visibly narrowed. Focal nonocclusive thrombus or direct tumor invasion is identified in the superior aspect of the superior mesenteric vein causing luminal stenosis of approximately 60%. The splenic vein remains open.  Bilateral renal artery show normal patency. The inferior mesenteric vein is open. Bilateral iliac and common femoral arteries show normal patency.  Appearance of the gallbladder and kidneys are unremarkable. The spleen shows a new focal  wedge-shaped infarct in its posterior and inferior aspect. The rest of the spleen shows normal perfusion. There is a stable left adrenal nodule. Bony structures show degenerative changes of the spine without evidence of focal lesions.  Review of the MIP images confirms the above findings.  IMPRESSION: 1. Significant progression of metastatic pancreatic carcinoma with enlargement of the mass at the level of the head of the pancreas and progression of hepatic metastatic disease. 2. 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 Tumor also encases and narrows the common hepatic artery, right and left hepatic arteries and gastroduodenal artery. There also is tumor involvement at the level of the superior mesenteric vein. 3. 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   Electronically Signed   By: Irish Lack M.D.   On: 02/11/2013 10:34         Subjective: No complaints. States this is the best he has felt since he has been here. Had a BM earlier today without evidence of blood.  Objective: Filed Vitals:   02/19/13 1000 02/19/13 1200 02/19/13 1300 02/20/13 0510  BP: 143/90  138/88 134/78  Pulse:   96 93  Temp:  97.9 F (36.6 C) 97.9 F (36.6 C) 99.4 F (37.4 C)  TempSrc:  Oral Oral Oral  Resp: 21  20 20   Height:   6\' 1"  (1.854 m)   Weight:   99.066 kg (218 lb 6.4 oz)   SpO2: 100%  99% 96%    Intake/Output Summary (Last 24 hours) at 02/20/13 1211 Last data filed at 02/20/13 0900  Gross per 24 hour  Intake   4110 ml  Output    300 ml  Net   3810 ml   Weight change: 2.766 kg (6  lb 1.6 oz) Exam:   General:  Pt is alert, follows commands appropriately, not in acute distress  HEENT: No icterus, No thrush,  [place]/AT  Cardiovascular: RRR, S1/S2, no rubs, no gallops  Respiratory: CTA bilaterally, no wheezing, no crackles, no rhonchi  Abdomen: Soft/+BS, non  tender, non distended, no guarding  Extremities: 1+ edema, No lymphangitis, No petechiae, No rashes, no synovitis  Data Reviewed: Basic Metabolic Panel:  Recent Labs Lab 02/14/13 0500 02/15/13 1036 02/17/13 1903 02/18/13 0130  NA 135 133* 134* 139  K 3.4* 3.8 3.4* 3.6  CL 107 104 106 110  CO2 21 18* 21 22  GLUCOSE 124* 135* 138* 106*  BUN 13 14 13 12   CREATININE 0.90 0.89 0.74 0.77  CALCIUM 8.4 8.1* 8.1* 8.6  MG 1.9  --   --   --    Liver Function Tests:  Recent Labs Lab 02/14/13 0500 02/15/13 1036  AST 22 25  ALT 22 21  ALKPHOS 85 81  BILITOT 0.5 0.4  PROT 5.1* 5.0*  ALBUMIN 2.3* 2.2*   No results found for this basename: LIPASE, AMYLASE,  in the last 168 hours No results found for this basename: AMMONIA,  in the last 168 hours CBC:  Recent Labs Lab 02/14/13 0500  02/18/13 1439 02/18/13 2210 02/19/13 0300 02/19/13 0800 02/20/13 0600  WBC 6.8  < > 7.7 10.0 8.7 8.6 7.3  NEUTROABS 5.1  --   --   --   --   --   --   HGB 9.0*  < > 8.6* 9.5* 9.2* 9.5* 9.0*  HCT 26.3*  < > 25.3* 28.3* 27.0* 27.6* 26.6*  MCV 86.2  < > 88.5 87.1 87.9 87.9 88.4  PLT 70*  < > 88* 84* 85* 84* 84*  < > = values in this interval not displayed. Cardiac Enzymes: No results found for this basename: CKTOTAL, CKMB, CKMBINDEX, TROPONINI,  in the last 168 hours BNP: No components found with this basename: POCBNP,  CBG: No results found for this basename: GLUCAP,  in the last 168 hours  Recent Results (from the past 240 hour(s))  CLOSTRIDIUM DIFFICILE BY PCR     Status: None   Collection Time    02/13/13  1:10 PM      Result Value Range Status   C difficile by pcr NEGATIVE  NEGATIVE Final   Comment: Performed at [facility]     Scheduled Meds: . folic acid  2 mg Oral Daily  . lipase/protease/amylase  2 capsule Oral TID AC & HS  . pantoprazole  80 mg Oral Daily  . sodium chloride  3 mL Intravenous Q12H  . sucralfate  2 g Oral Q6H   Continuous Infusions: . sodium  chloride Stopped (02/17/13 1915)  . sodium chloride 50 mL/hr at 02/20/13 0841     Chaya Jan, MD  [facility] Pager [phone]  If 7PM-7AM, please contact night-coverage [email or web address] Password [email or web address] 02/20/2013, 12:11 PM   LOS: 5 days

## 2013-02-21 LAB — CBC
HCT: 26.4 % — ABNORMAL LOW (ref 39.0–52.0)
Hemoglobin: 8.9 g/dL — ABNORMAL LOW (ref 13.0–17.0)
MCH: 29.8 pg (ref 26.0–34.0)
MCHC: 33.7 g/dL (ref 30.0–36.0)
MCV: 88.3 fL (ref 78.0–100.0)
Platelets: 63 10*3/uL — ABNORMAL LOW (ref 150–400)
RDW: 17.6 % — ABNORMAL HIGH (ref 11.5–15.5)

## 2013-02-21 MED ORDER — LOPERAMIDE HCL 2 MG PO CAPS
2.0000 mg | ORAL_CAPSULE | ORAL | Status: DC | PRN
  Administered 2013-02-21 (×2): 2 mg via ORAL
  Filled 2013-02-21 (×2): qty 1

## 2013-02-21 MED ORDER — DIPHENHYDRAMINE HCL 25 MG PO CAPS
25.0000 mg | ORAL_CAPSULE | Freq: Once | ORAL | Status: AC
  Administered 2013-02-21: 25 mg via ORAL
  Filled 2013-02-21: qty 1

## 2013-02-21 MED ORDER — SUCRALFATE 1 GM/10ML PO SUSP
2.0000 g | ORAL | Status: DC
  Administered 2013-02-21 – 2013-02-27 (×19): 2 g via ORAL
  Filled 2013-02-21 (×26): qty 20

## 2013-02-21 MED ORDER — ACETAMINOPHEN 325 MG PO TABS
650.0000 mg | ORAL_TABLET | Freq: Once | ORAL | Status: AC
  Administered 2013-02-21: 650 mg via ORAL
  Filled 2013-02-21: qty 2

## 2013-02-21 NOTE — Progress Notes (Signed)
Austin Escobar is holding stable. He's not noticed any obvious bleeding. Hemoglobin is 8.9.  He is eating well. He is drinking a lot of fluids. We can probably continue to decrease his IV fluid rate.  He is getting out of bed. He may not need any physical therapy. I do appreciate them checking on him.  There is no pain. His cough or shortness of breath. He does have the IVC filter in for the pulmonary embolisms and lack of anticoagulation do to his bleeding.  He has radiation therapy this week. I think he has 5 more treatments.  His vital signs are stable. Blood pressure 141/76. Abdomen is slightly distended. Is non-tender. He has good bowel sounds. Lungs are clear. Cardiac exam regular rate rhythm. Extremities shows decrease in edema. Neurological exam shows no focal neurological deficits.  His blood count 63,000. This does tend to fluctuate a little bit. I will go ahead and give him 1 unit of platelets. I really think that we have to try to keep his platelet count of just to minimize risk of rebleeding. He continues on vitamin K. His INR is normal as well as PTT.  I would think that if everything is stable, he might be able to be discharged early this week.   Pete e.  2 Cor 12:9-10

## 2013-02-21 NOTE — Progress Notes (Signed)
[facility] PROGRESS NOTE  Austin Escobar:[id number] DOB: [birth year] DOA: 02/15/2013 PCP: [name], DO  Interim history  57 y.o. 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 Due to poor prognosis, patient was referred for possible trial at [facility] by Dr. [name] (oncologist), although not yet accepted. He was DNR/DNI. Readmit on 12/15 and wanted to change to FULL CODE. Recurrent GIB beginning on the evening of 12/14, with blood mixed with stool and then progressed with 3-4 episodes of frank bloody clots. He had subsequent dizziness. Hgb on admission was  7.3 (was 9.0 on discharge) , requiring 2 units of blood. He also received 2 units of FFP 02/16/2013 due to INR 1.7. He has had a bowel movement this morning without any visible blood. Dr. [name] attempted duodenal stent to assist with tamponade of bleed, but this was unsuccessful due to delivery system being too short.  Assessment/Plan: Acute blood loss anemia  -2 additional units PRBC 12/18 -Has received 7 units PRBCs during this admission; 2units FFP -Underwent embolization 12/18 of the GDA by IR 12/18. -Has not had any further bleeding today; in fact had a BM without blood. -appreciate GI and IR followup  -Avoid NSAIDs  -Hemoglobin has remained fairly stable. Patient is hemodynamically stable  -Full liquid diet for now  -origin of bleed likely from duodenal bulb where there is tumor invasion   GI bleed -As above.  Coagulopathy  -Received 2 units FFP 02/16/2013 when his INR was 1.7  -vitamin K given 12/18  Thrombocytopenia -Heme/onc  has decided to transfuse platelets today to decrease possibility of bleeding.  Esophageal varices  -No bleeding noted from previous endoscopy  -Continue Protonix and Carafate   Saddle pulmonary embolus  -12/23/2012  -Status post IVC filter in December 2014  -Currently stable without any respiratory distress or hypoxemia   Atrial fibrillation  -Status post ablation 05/25/2012  -Currently in sinus rhythm  -Not a candidate for anticoagulation   Metastatic pancreatic adenocarcinoma with duodenal invasion  -Appreciate oncology followup  -continue radiation therapy. -Dr. [name] is following   -LIMITED CODE--NO CPR, NO SHOCK, NO INTUBATION, YES VASOPRESSOR MEDS  HTN  -Metoprolol tartrate is on hold due to soft blood pressures   Family Communication: Patient only Disposition Plan: Transfer to floor. Start PT/OT, mobilize more. Anticipate DC home  after radiation on Monday.      Procedures/Studies: Ir Angiogram Visceral  Selective  02/18/2013   INDICATION: History of metastatic pancreatic cancer, now with acute and recurrent upper GI bleeding from the duodenum based on recent endoscopy. Post failed attempted endoscopic duodenal stent placement and mesenteric arteriogram  EXAM: 1.  CELIAC ARTERIOGRAM  2.  COMMON HEPATIC ARTERIOGRAM (3RD ORDER).  3. SUB SELECTIVE GASTRODUODENAL ARTERIOGRAM (3RD ORDER) WITH PARTICLE AND COIL EMBOLIZATION  4.  ULTRASOUND GUIDANCE FOR ARTERIAL ACCESS  MEDICATIONS: Nitroglycerin 100 mcg IA  TECHNIQUE: Informed written consent was obtained from the patient after a discussion of the risks, benefits and alternatives to treatment. Questions regarding the procedure were encouraged and answered. A timeout was performed prior to the initiation of the procedure.  The right groin was prepped and draped in the usual sterile fashion, and a sterile drape was applied covering the operative field. Maximum barrier sterile technique with sterile gowns and gloves were used for the procedure. A timeout was performed prior to the initiation of the procedure. Local anesthesia was provided with 1% lidocaine.  The right femoral head was marked fluoroscopically. Under ultrasound guidance, the right common femoral artery was accessed with a micropuncture kit after the overlying soft tissues were anesthetized with 1% lidocaine. An ultrasound image was saved for documentation purposes. The micropuncture sheath was exchanged for a 5 [place] vascular sheath over a Bentson wire. A closure arteriogram was performed through the side of the sheath confirming access within the right common femoral artery.  Over a Bentson wire, a Mickelson catheter was advanced to the level of the thoracic aorta where it was back bled and flushed. The catheter was then utilized to select the celiac artery and a celiac arteriogram was performed. 100 mcg of nitroglycerin was not administered intra-arterially through the celiac arterial vascular  distribution.  With the use of a Fathom 14 microwire, a regular Renegade microcatheter was advanced into the common hepatic artery and a common hepatic arteriogram was performed. The microcatheter was then advanced into the distal aspect of the gastroduodenal artery and a sub selective gastroduodenal arteriogram was performed.  Initially, the distal tributaries of the GDA were embolized with 700-900 micron Embospheres. Limited post particle embolization arteriogram was performed. Subsequently, the gastroduodenal artery was coil embolized to near its origin with a combination of overlapping 2 and 3 mm diameter interlock coils.  The micro catheter was retracted into the common hepatic artery and a repeat common hepatic arteriogram was performed. The micro catheter was removed and a post embolization celiac arteriogram was performed. All images were reviewed and the procedure was terminated. All wires, catheters and sheaths were removed from the patient. Hemostasis was achieved at the right groin access site with manual compression.  ANESTHESIA/SEDATION: Fentanyl 200 mcg IV; Versed 4 mg IV  60 minutes  CONTRAST:  50 cc Omnipaque 300  COMPARISON:  None.  FLUOROSCOPY TIME:  11 min, 42 seconds  COMPLICATIONS: None immediate  FINDINGS: Celiac arteriogram demonstrates persistent irregular narrowing of both the common and proper hepatic arteries extending preferentially involve the origin of the right hepatic artery.  The gastroduodenal artery remains patent though irregularly narrowed throughout its course.  The gastroduodenal artery was successfully cannulated and embolized with a combination of 700 to 900 micron Embospheres and overlapping interlock coils to near its origin. 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  IMPRESSION: 1. Successful prophylactic particle and coil embolization of the gastroduodenal artery secondary to recurrent and refractory upper GI (  duodenal)  bleed due to malignant invasion from patient's known pancreatic head carcinoma. 2. While the common and proper hepatic arteries remain irregularly narrowed throughout their course, I do not feel that these vessels are contributing to the patient's current hemodynamic instability and as such were not embolized.  PLAN: Pending the patient's wishes, would recommend continued aggressive conservative management. 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   Electronically Signed   By: Simonne Come M.D.   On: 02/18/2013 13:58   Ir Angiogram Visceral Selective  02/18/2013   CLINICAL DATA:  57 year old with metastatic pancreatic cancer. Acute and recurrent upper GI bleeding. The patient has had previous duodenal bleeding based on endoscopy.  EXAM: VISCERAL ARTERIOGRAPHY ; SELECTIVE VISCERAL ARTERIOGRAPHY ; ULTRASOUND GUIDANCE FOR VASCULAR ACCESS  Physician: Rachelle Hora. Henn, MD  MEDICATIONS: Versed 5 mg, fentanyl 100 mcg. A radiology nurse monitored the patient for moderate sedation.  ANESTHESIA/SEDATION: Moderate sedation time: 1 hr and 45 min  CONTRAST:  85 mL Omnipaque 300  FLUOROSCOPY TIME:  29 min and 30 seconds  PROCEDURE: The procedure was explained to the patient. The risks and benefits of the procedure were discussed and the patient's questions were addressed. Informed consent was obtained from the patient. Patient was brought to the interventional suite. The right groin was prepped and draped in sterile fashion. Maximal barrier sterile technique was utilized including caps, mask, sterile gowns, sterile  gloves, sterile drape, hand hygiene and skin antiseptic. The skin was anesthetized with 1% lidocaine. Using ultrasound guidance, a 21 gauge needle was directed into the right common femoral artery and a micropuncture dilator set was placed. A 5 French vascular sheath was placed over a Bentson wire. The superior mesenteric artery was cannulated with a C2 catheter. Superior mesenteric arteriogram was performed. The C2 catheter was used to cannulate the celiac trunk. Celiac arteriography was performed. A Glidewire was advanced into the common hepatic artery and the C2 catheter was advanced into the proximal common hepatic artery. At this point, there appeared to be extensive spasm in the common hepatic artery. C2 catheter was pulled back to the celiac trunk origin and additional angiography was performed. Multiple attempts were made to cannulate the common hepatic artery with a Renegade micro catheter. Cannulating the common hepatic artery was very difficult with the micro catheter. As result, the C2 catheter was exchanged for a Sos catheter. Eventually, a glide micro wire was advanced into the common hepatic artery and proper hepatic artery. The Renegade catheter was advanced to the common hepatic artery and additional angiography was performed. Catheter was also advanced into the proper hepatic artery and additional angiography was performed. Multiple attempts were made to cannulate the gastroduodenal artery with a wire and catheter. These attempts at cannulating the gastroduodenal artery were unsuccessful. The micro catheter and 5 French catheter were removed. Final angiogram was performed through the right groin sheath. The groin sheath was removed with an Exoseal closure device. There was groin hemostasis at the end of the procedure.  COMPLICATIONS: None  FINDINGS: Superior mesenteric arteriogram: Patient has a metallic biliary stent and IVC filter. Patient has a large replaced right hepatic artery  that originates  near the origin of the superior mesenteric artery. There are small pancreatic-duodenal branches but no evidence for a vascular malformation or contrast extravasation. The delayed images demonstrate patency of the superior mesenteric veins but there is marked attenuation and narrowing of the portal confluence and main portal vein. The intrahepatic portal venous system appears to be patent.  Celiac arteriography: The splenic artery is large and patent. There is a prominent left gastric artery with filling of the right gastric artery. There is mild narrowing and irregularity at the common hepatic artery and proper hepatic artery. There appears to be irregularity of the gastroduodenal artery. There is narrowing involving the origin of the right hepatic artery. There is no evidence for acute extravasation. After a wire was advanced into the common hepatic artery, there was marked narrowing of the common hepatic artery suggestive for vasospasm and minimal flow into the gastroduodenal artery. At one point, there appeared to be retrograde flow into the gastroduodenal artery. After the vasospasm resolved, the flow to the common hepatic artery and gastroduodenal artery returned to baseline.  IMPRESSION: Irregularity of the common hepatic artery, gastroduodenal artery and proper hepatic artery. These findings are related to the surrounding tumor burden. There was extensive vasospasm in the common hepatic artery during this procedure but no evidence for acute bleeding or contrast extravasation. The gastroduodenal artery could never be successfully cannulated and, therefore, an empiric gastroduodenal embolization could not be performed.  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   Electronically Signed   By: Richarda Overlie M.D.   On: 02/18/2013 10:29   Ir Angiogram Visceral Selective  02/18/2013   CLINICAL DATA:  57 year old with metastatic pancreatic cancer. Acute and recurrent upper GI  bleeding. The patient has had previous duodenal bleeding based on endoscopy.  EXAM: VISCERAL ARTERIOGRAPHY ; SELECTIVE VISCERAL ARTERIOGRAPHY ; ULTRASOUND GUIDANCE FOR VASCULAR ACCESS  Physician: Rachelle Hora. Henn, MD  MEDICATIONS: Versed 5 mg, fentanyl 100 mcg. A radiology nurse monitored the patient for moderate sedation.  ANESTHESIA/SEDATION: Moderate sedation time: 1 hr and 45 min  CONTRAST:  85 mL Omnipaque 300  FLUOROSCOPY TIME:  29 min and 30 seconds  PROCEDURE: The procedure was explained to the patient. The risks and benefits of the procedure were discussed and the patient's questions were addressed. Informed consent was obtained from the patient. Patient was brought to the interventional suite. The right groin was prepped and draped in sterile fashion. Maximal barrier sterile technique was utilized including caps, mask, sterile gowns, sterile gloves, sterile drape, hand hygiene and skin antiseptic. The skin was anesthetized with 1% lidocaine. Using ultrasound guidance, a 21 gauge needle was directed into the right common femoral artery and a micropuncture dilator set was placed. A 5 French vascular sheath was placed over a Bentson wire. The superior mesenteric artery was cannulated with a C2 catheter. Superior mesenteric arteriogram was performed. The C2 catheter was used to cannulate the celiac trunk. Celiac arteriography was performed. A Glidewire was advanced into the common hepatic artery and the C2 catheter was advanced into the proximal common hepatic artery. At this point, there appeared to be extensive spasm in the common hepatic artery. C2 catheter was pulled back to the celiac trunk origin and additional angiography was performed. Multiple attempts were made to cannulate the common hepatic artery with a Renegade micro catheter. Cannulating the common hepatic artery was very difficult with the micro catheter. As result, the C2 catheter was exchanged for a Sos catheter. Eventually,  a glide micro wire  was advanced into the common hepatic artery and proper hepatic artery. The Renegade catheter was advanced to the common hepatic artery and additional angiography was performed. Catheter was also advanced into the proper hepatic artery and additional angiography was performed. Multiple attempts were made to cannulate the gastroduodenal artery with a wire and catheter. These attempts at cannulating the gastroduodenal artery were unsuccessful. The micro catheter and 5 French catheter were removed. Final angiogram was performed through the right groin sheath. The groin sheath was removed with an Exoseal closure device. There was groin hemostasis at the end of the procedure.  COMPLICATIONS: None  FINDINGS: Superior mesenteric arteriogram: Patient has a metallic biliary stent and IVC filter. Patient has a large replaced right hepatic artery that originates near the origin of the superior mesenteric artery. There are small pancreatic-duodenal branches but no evidence for a vascular malformation or contrast extravasation. The delayed images demonstrate patency of the superior mesenteric veins but there is marked attenuation and narrowing of the portal confluence and main portal vein. The intrahepatic portal venous system appears to be patent.  Celiac arteriography: The splenic artery is large and patent. There is a prominent left gastric artery with filling of the right gastric artery. There is mild narrowing and irregularity at the common hepatic artery and proper hepatic artery. There appears to be irregularity of the gastroduodenal artery. There is narrowing involving the origin of the right hepatic artery. There is no evidence for acute extravasation. After a wire was advanced into the common hepatic artery, there was marked narrowing of the common hepatic artery suggestive for vasospasm and minimal flow into the gastroduodenal artery. At one point, there appeared to be retrograde flow into the gastroduodenal artery.  After the vasospasm resolved, the flow to the common hepatic artery and gastroduodenal artery returned to baseline.  IMPRESSION: Irregularity of the common hepatic artery, gastroduodenal artery and proper hepatic artery. These findings are related to the surrounding tumor burden. There was extensive vasospasm in the common hepatic artery during this procedure but no evidence for acute bleeding or contrast extravasation. The gastroduodenal artery could never be successfully cannulated and, therefore, an empiric gastroduodenal embolization could not be performed.  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   Electronically Signed   By: Richarda Overlie M.D.   On: 02/18/2013 10:29   Ir Angiogram Selective Each Additional Vessel  02/18/2013   INDICATION: History of metastatic pancreatic cancer, now with acute and recurrent upper GI bleeding from the duodenum based on recent endoscopy. Post failed attempted endoscopic duodenal stent placement and mesenteric arteriogram  EXAM: 1.  CELIAC ARTERIOGRAM  2.  COMMON HEPATIC ARTERIOGRAM (3RD ORDER).  3. SUB SELECTIVE GASTRODUODENAL ARTERIOGRAM (3RD ORDER) WITH PARTICLE AND COIL EMBOLIZATION  4.  ULTRASOUND GUIDANCE FOR ARTERIAL ACCESS  MEDICATIONS: Nitroglycerin 100 mcg IA  TECHNIQUE: Informed written consent was obtained from the patient after a discussion of the risks, benefits and alternatives to treatment. Questions regarding the procedure were encouraged and answered. A timeout was performed prior to the initiation of the procedure.  The right groin was prepped and draped in the usual sterile fashion, and a sterile drape was applied covering the operative field. Maximum barrier sterile technique with sterile gowns and gloves were used for the procedure. A timeout was performed prior to the initiation of the procedure. Local anesthesia was provided with 1% lidocaine.  The right femoral head was marked fluoroscopically. Under ultrasound  guidance, the right  common femoral artery was accessed with a micropuncture kit after the overlying soft tissues were anesthetized with 1% lidocaine. An ultrasound image was saved for documentation purposes. The micropuncture sheath was exchanged for a 5 [place] vascular sheath over a Bentson wire. A closure arteriogram was performed through the side of the sheath confirming access within the right common femoral artery.  Over a Bentson wire, a Mickelson catheter was advanced to the level of the thoracic aorta where it was back bled and flushed. The catheter was then utilized to select the celiac artery and a celiac arteriogram was performed. 100 mcg of nitroglycerin was not administered intra-arterially through the celiac arterial vascular distribution.  With the use of a Fathom 14 microwire, a regular Renegade microcatheter was advanced into the common hepatic artery and a common hepatic arteriogram was performed. The microcatheter was then advanced into the distal aspect of the gastroduodenal artery and a sub selective gastroduodenal arteriogram was performed.  Initially, the distal tributaries of the GDA were embolized with 700-900 micron Embospheres. Limited post particle embolization arteriogram was performed. Subsequently, the gastroduodenal artery was coil embolized to near its origin with a combination of overlapping 2 and 3 mm diameter interlock coils.  The micro catheter was retracted into the common hepatic artery and a repeat common hepatic arteriogram was performed. The micro catheter was removed and a post embolization celiac arteriogram was performed. All images were reviewed and the procedure was terminated. All wires, catheters and sheaths were removed from the patient. Hemostasis was achieved at the right groin access site with manual compression.  ANESTHESIA/SEDATION: Fentanyl 200 mcg IV; Versed 4 mg IV  60 minutes  CONTRAST:  50 cc Omnipaque 300  COMPARISON:  None.  FLUOROSCOPY TIME:  11 min, 42  seconds  COMPLICATIONS: None immediate  FINDINGS: Celiac arteriogram demonstrates persistent irregular narrowing of both the common and proper hepatic arteries extending preferentially involve the origin of the right hepatic artery.  The gastroduodenal artery remains patent though irregularly narrowed throughout its course.  The gastroduodenal artery was successfully cannulated and embolized with a combination of 700 to 900 micron Embospheres and overlapping interlock coils to near its origin. 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  IMPRESSION: 1. Successful prophylactic particle and coil embolization of the gastroduodenal artery secondary to recurrent and refractory upper GI (duodenal) bleed due to malignant invasion from patient's known pancreatic head carcinoma. 2. While the common and proper hepatic arteries remain irregularly narrowed throughout their course, I do not feel that these vessels are contributing to the patient's current hemodynamic instability and as such were not embolized.  PLAN: Pending the patient's wishes, would recommend continued aggressive conservative management. 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   Electronically Signed   By: Simonne Come M.D.   On: 02/18/2013 13:58   Ir Angiogram Selective Each Additional Vessel  02/18/2013   CLINICAL DATA:  57 year old with metastatic pancreatic cancer. Acute and recurrent upper GI bleeding. The patient has had previous duodenal bleeding based on endoscopy.  EXAM: VISCERAL  ARTERIOGRAPHY ; SELECTIVE VISCERAL ARTERIOGRAPHY ; ULTRASOUND GUIDANCE FOR VASCULAR ACCESS  Physician: Madelaine Bhat  Harriet Pho, MD  MEDICATIONS: Versed 5 mg, fentanyl 100 mcg. A radiology nurse monitored the patient for moderate sedation.  ANESTHESIA/SEDATION: Moderate sedation time: 1 hr and 45 min  CONTRAST:  85 mL Omnipaque 300  FLUOROSCOPY TIME:  29 min and 30 seconds  PROCEDURE: The procedure was explained to the patient. The risks and benefits of the procedure were discussed and the patient's questions were addressed. Informed consent was obtained from the patient. Patient was brought to the interventional suite. The right groin was prepped and draped in sterile fashion. Maximal barrier sterile technique was utilized including caps, mask, sterile gowns, sterile gloves, sterile drape, hand hygiene and skin antiseptic. The skin was anesthetized with 1% lidocaine. Using ultrasound guidance, a 21 gauge needle was directed into the right common femoral artery and a micropuncture dilator set was placed. A 5 French vascular sheath was placed over a Bentson wire. The superior mesenteric artery was cannulated with a C2 catheter. Superior mesenteric arteriogram was performed. The C2 catheter was used to cannulate the celiac trunk. Celiac arteriography was performed. A Glidewire was advanced into the common hepatic artery and the C2 catheter was advanced into the proximal common hepatic artery. At this point, there appeared to be extensive spasm in the common hepatic artery. C2 catheter was pulled back to the celiac trunk origin and additional angiography was performed. Multiple attempts were made to cannulate the common hepatic artery with a Renegade micro catheter. Cannulating the common hepatic artery was very difficult with the micro catheter. As result, the C2 catheter was exchanged for a Sos catheter. Eventually, a glide micro wire was advanced into the common hepatic artery and proper hepatic artery. The Renegade catheter  was advanced to the common hepatic artery and additional angiography was performed. Catheter was also advanced into the proper hepatic artery and additional angiography was performed. Multiple attempts were made to cannulate the gastroduodenal artery with a wire and catheter. These attempts at cannulating the gastroduodenal artery were unsuccessful. The micro catheter and 5 French catheter were removed. Final angiogram was performed through the right groin sheath. The groin sheath was removed with an Exoseal closure device. There was groin hemostasis at the end of the procedure.  COMPLICATIONS: None  FINDINGS: Superior mesenteric arteriogram: Patient has a metallic biliary stent and IVC filter. Patient has a large replaced right hepatic artery that originates near the origin of the superior mesenteric artery. There are small pancreatic-duodenal branches but no evidence for a vascular malformation or contrast extravasation. The delayed images demonstrate patency of the superior mesenteric veins but there is marked attenuation and narrowing of the portal confluence and main portal vein. The intrahepatic portal venous system appears to be patent.  Celiac arteriography: The splenic artery is large and patent. There is a prominent left gastric artery with filling of the right gastric artery. There is mild narrowing and irregularity at the common hepatic artery and proper hepatic artery. There appears to be irregularity of the gastroduodenal artery. There is narrowing involving the origin of the right hepatic artery. There is no evidence for acute extravasation. After a wire was advanced into the common hepatic artery, there was marked narrowing of the common hepatic artery suggestive for vasospasm and minimal flow into the gastroduodenal artery. At one point, there appeared to be retrograde flow into the gastroduodenal artery. After the vasospasm resolved, the flow to the common hepatic artery and gastroduodenal artery  returned to baseline.  IMPRESSION: Irregularity of the common hepatic artery, gastroduodenal artery and proper  hepatic artery. These findings are related to the surrounding tumor burden. There was extensive vasospasm in the common hepatic artery during this procedure but no evidence for acute bleeding or contrast extravasation. The gastroduodenal artery could never be successfully cannulated and, therefore, an empiric gastroduodenal embolization could not be performed.  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   Electronically Signed   By: Richarda Overlie M.D.   On: 02/18/2013 10:29   Ir Angiogram Selective Each Additional Vessel  02/18/2013   CLINICAL DATA:  57 year old with metastatic pancreatic cancer. Acute and recurrent upper GI bleeding. The patient has had previous duodenal bleeding based on endoscopy.  EXAM: VISCERAL ARTERIOGRAPHY ; SELECTIVE VISCERAL ARTERIOGRAPHY ; ULTRASOUND GUIDANCE FOR VASCULAR ACCESS  Physician: Rachelle Hora. Henn, MD  MEDICATIONS: Versed 5 mg, fentanyl 100 mcg. A radiology nurse monitored the patient for moderate sedation.  ANESTHESIA/SEDATION: Moderate sedation time: 1 hr and 45 min  CONTRAST:  85 mL Omnipaque 300  FLUOROSCOPY TIME:  29 min and 30 seconds  PROCEDURE: The procedure was explained to the patient. The risks and benefits of the procedure were discussed and the patient's questions were addressed. Informed consent was obtained from the patient. Patient was brought to the interventional suite. The right groin was prepped and draped in sterile fashion. Maximal barrier sterile technique was utilized including caps, mask, sterile gowns, sterile gloves, sterile drape, hand hygiene and skin antiseptic. The skin was anesthetized with 1% lidocaine. Using ultrasound guidance, a 21 gauge needle was directed into the right common femoral artery and a micropuncture dilator set was placed. A 5 French vascular sheath was placed over a Bentson wire. The  superior mesenteric artery was cannulated with a C2 catheter. Superior mesenteric arteriogram was performed. The C2 catheter was used to cannulate the celiac trunk. Celiac arteriography was performed. A Glidewire was advanced into the common hepatic artery and the C2 catheter was advanced into the proximal common hepatic artery. At this point, there appeared to be extensive spasm in the common hepatic artery. C2 catheter was pulled back to the celiac trunk origin and additional angiography was performed. Multiple attempts were made to cannulate the common hepatic artery with a Renegade micro catheter. Cannulating the common hepatic artery was very difficult with the micro catheter. As result, the C2 catheter was exchanged for a Sos catheter. Eventually, a glide micro wire was advanced into the common hepatic artery and proper hepatic artery. The Renegade catheter was advanced to the common hepatic artery and additional angiography was performed. Catheter was also advanced into the proper hepatic artery and additional angiography was performed. Multiple attempts were made to cannulate the gastroduodenal artery with a wire and catheter. These attempts at cannulating the gastroduodenal artery were unsuccessful. The micro catheter and 5 French catheter were removed. Final angiogram was performed through the right groin sheath. The groin sheath was removed with an Exoseal closure device. There was groin hemostasis at the end of the procedure.  COMPLICATIONS: None  FINDINGS: Superior mesenteric arteriogram: Patient has a metallic biliary stent and IVC filter. Patient has a large replaced right hepatic artery that originates near the origin of the superior mesenteric artery. There are small pancreatic-duodenal branches but no evidence for a vascular malformation or contrast extravasation. The delayed images demonstrate patency of the superior mesenteric veins but there is marked attenuation and narrowing of the portal  confluence and main portal vein. The intrahepatic portal venous system appears to be patent.  Celiac arteriography: The splenic  artery is large and patent. There is a prominent left gastric artery with filling of the right gastric artery. There is mild narrowing and irregularity at the common hepatic artery and proper hepatic artery. There appears to be irregularity of the gastroduodenal artery. There is narrowing involving the origin of the right hepatic artery. There is no evidence for acute extravasation. After a wire was advanced into the common hepatic artery, there was marked narrowing of the common hepatic artery suggestive for vasospasm and minimal flow into the gastroduodenal artery. At one point, there appeared to be retrograde flow into the gastroduodenal artery. After the vasospasm resolved, the flow to the common hepatic artery and gastroduodenal artery returned to baseline.  IMPRESSION: Irregularity of the common hepatic artery, gastroduodenal artery and proper hepatic artery. These findings are related to the surrounding tumor burden. There was extensive vasospasm in the common hepatic artery during this procedure but no evidence for acute bleeding or contrast extravasation. The gastroduodenal artery could never be successfully cannulated and, therefore, an empiric gastroduodenal embolization could not be performed.  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   Electronically Signed   By: Richarda Overlie M.D.   On: 02/18/2013 10:29   Ir Ivc Filter Plmt / S&i /img Guid/mod Sed  02/11/2013   CLINICAL DATA:  Metastatic pancreatic carcinoma. History of pulmonary embolism. GI bleeding, a relative contraindication to anticoagulation.  EXAM: INFERIOR VENACAVOGRAM  IVC FILTER PLACEMENT UNDER FLUOROSCOPY  TECHNIQUE: The procedure, risks (including but not limited to bleeding, infection, organ damage ), benefits, and alternatives were explained to the patient. Questions  regarding the procedure were encouraged and answered. The patient understands and consents to the procedure. Caval anatomy reviewed on previous CT abdomen. Patency of the right IJ vein was confirmed with ultrasound with image documentation. An appropriate skin site was determined. Skin site was marked, prepped with chlorhexidine, and draped using maximum barrier technique. The region was infiltrated locally with 1% lidocaine.  Intravenous Fentanyl and Versed were administered as conscious sedation during continuous cardiorespiratory monitoring by the radiology RN, with a total moderate sedation time of less than 30 minutes.  Under real-time ultrasound guidance, the right IJ vein was accessed with a 21 gauge micropuncture needle; the needle tip within the vein was confirmed with ultrasound image documentation. The needle was exchanged over a 018 guidewire for a transitional dilator, which allow advancement of the [facility] wire into the IVC. A long 6 French vascular sheath was placed for inferior venacavography. This demonstrated no caval thrombus. Renal vein inflows were evident.  The [facility] IVC filter was advanced through the sheath and successfully deployed under fluoroscopy at the L2-3 disc level. Followup cavagram demonstrates stable filter position and no evident complication. The sheath was removed and hemostasis achieved at the site. No immediate complication.  FLUOROSCOPY TIME:  36 seconds  IMPRESSION: 1. Normal IVC. No thrombus or significant anatomic variation. 2. Technically successful infrarenal IVC filter placement. This is a retrievable model.   Electronically Signed   By: Oley Balm M.D.   On: 02/11/2013 13:43   Ir US Guide Vasc Access Right  02/18/2013   INDICATION: History of metastatic pancreatic cancer, now with acute and recurrent upper GI bleeding from the duodenum based on recent endoscopy. Post failed attempted endoscopic duodenal stent placement and mesenteric arteriogram  EXAM: 1.   CELIAC ARTERIOGRAM  2.  COMMON HEPATIC ARTERIOGRAM (3RD ORDER).  3. SUB SELECTIVE GASTRODUODENAL ARTERIOGRAM (3RD ORDER) WITH PARTICLE AND COIL EMBOLIZATION  4.  ULTRASOUND GUIDANCE FOR ARTERIAL ACCESS  MEDICATIONS: Nitroglycerin 100 mcg IA  TECHNIQUE: Informed written consent was obtained from the patient after a discussion of the risks, benefits and alternatives to treatment. Questions regarding the procedure were encouraged and answered. A timeout was performed prior to the initiation of the procedure.  The right groin was prepped and draped in the usual sterile fashion, and a sterile drape was applied covering the operative field. Maximum barrier sterile technique with sterile gowns and gloves were used for the procedure. A timeout was performed prior to the initiation of the procedure. Local anesthesia was provided with 1% lidocaine.  The right femoral head was marked fluoroscopically. Under ultrasound guidance, the right common femoral artery was accessed with a micropuncture kit after the overlying soft tissues were anesthetized with 1% lidocaine. An ultrasound image was saved for documentation purposes. The micropuncture sheath was exchanged for a 5 [place] vascular sheath over a Bentson wire. A closure arteriogram was performed through the side of the sheath confirming access within the right common femoral artery.  Over a Bentson wire, a Mickelson catheter was advanced to the level of the thoracic aorta where it was back bled and flushed. The catheter was then utilized to select the celiac artery and a celiac arteriogram was performed. 100 mcg of nitroglycerin was not administered intra-arterially through the celiac arterial vascular distribution.  With the use of a Fathom 14 microwire, a regular Renegade microcatheter was advanced into the common hepatic artery and a common hepatic arteriogram was performed. The microcatheter was then advanced into the distal aspect of the gastroduodenal artery and a sub  selective gastroduodenal arteriogram was performed.  Initially, the distal tributaries of the GDA were embolized with 700-900 micron Embospheres. Limited post particle embolization arteriogram was performed. Subsequently, the gastroduodenal artery was coil embolized to near its origin with a combination of overlapping 2 and 3 mm diameter interlock coils.  The micro catheter was retracted into the common hepatic artery and a repeat common hepatic arteriogram was performed. The micro catheter was removed and a post embolization celiac arteriogram was performed. All images were reviewed and the procedure was terminated. All wires, catheters and sheaths were removed from the patient. Hemostasis was achieved at the right groin access site with manual compression.  ANESTHESIA/SEDATION: Fentanyl 200 mcg IV; Versed 4 mg IV  60 minutes  CONTRAST:  50 cc Omnipaque 300  COMPARISON:  None.  FLUOROSCOPY TIME:  11 min, 42 seconds  COMPLICATIONS: None immediate  FINDINGS: Celiac arteriogram demonstrates persistent irregular narrowing of both the common and proper hepatic arteries extending preferentially involve the origin of the right hepatic artery.  The gastroduodenal artery remains patent though irregularly narrowed throughout its course.  The gastroduodenal artery was successfully cannulated and embolized with a combination of 700 to 900 micron Embospheres and overlapping interlock coils to near its origin. 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  IMPRESSION: 1. Successful prophylactic particle and coil embolization of the gastroduodenal artery secondary to recurrent and refractory upper GI (duodenal) bleed due to malignant invasion from patient's known pancreatic head carcinoma. 2. While the common and proper hepatic arteries remain irregularly narrowed throughout their course, I do not feel that these vessels are contributing to the patient's current hemodynamic  instability and as such were not embolized.  PLAN: Pending the patient's wishes, would recommend continued aggressive conservative management. 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   Electronically Signed   By: Simonne Come M.D.   On: 02/18/2013 13:58   Ir US Guide Vasc Access Right  02/18/2013   CLINICAL DATA:  57 year old with metastatic pancreatic cancer. Acute and recurrent upper GI bleeding. The patient has had previous duodenal bleeding based on endoscopy.  EXAM: VISCERAL ARTERIOGRAPHY ; SELECTIVE VISCERAL ARTERIOGRAPHY ; ULTRASOUND GUIDANCE FOR VASCULAR ACCESS  Physician: Rachelle Hora. Henn, MD  MEDICATIONS: Versed 5 mg, fentanyl 100 mcg. A radiology nurse monitored the patient for moderate sedation.  ANESTHESIA/SEDATION: Moderate sedation time: 1 hr and 45 min  CONTRAST:  85 mL Omnipaque 300  FLUOROSCOPY TIME:  29 min and 30 seconds  PROCEDURE: The procedure was explained to the patient. The risks and benefits of the procedure were discussed and the patient's questions were addressed. Informed consent was obtained from the patient. Patient was brought to the interventional suite. The right groin was prepped and draped in sterile fashion. Maximal barrier sterile technique was utilized including caps, mask, sterile gowns, sterile gloves, sterile drape, hand hygiene and skin antiseptic. The skin was anesthetized with 1% lidocaine. Using ultrasound guidance, a 21 gauge needle was directed into the right common femoral artery and a micropuncture dilator set was placed. A 5 French vascular sheath was  placed over a Bentson wire. The superior mesenteric artery was cannulated with a C2 catheter. Superior mesenteric arteriogram was performed. The C2 catheter was used to cannulate the celiac trunk. Celiac arteriography was performed. A Glidewire was advanced into the common hepatic artery and the C2 catheter was advanced into the proximal common hepatic artery. At this point, there appeared to be extensive spasm in the common hepatic artery. C2 catheter was pulled back to the celiac trunk origin and additional angiography was performed. Multiple attempts were made to cannulate the common hepatic artery with a Renegade micro catheter. Cannulating the common hepatic artery was very difficult with the micro catheter. As result, the C2 catheter was exchanged for a Sos catheter. Eventually, a glide micro wire was advanced into the common hepatic artery and proper hepatic artery. The Renegade catheter was advanced to the common hepatic artery and additional angiography was performed. Catheter was also advanced into the proper hepatic artery and additional angiography was performed. Multiple attempts were made to cannulate the gastroduodenal artery with a wire and catheter. These attempts at cannulating the gastroduodenal artery were unsuccessful. The micro catheter and 5 French catheter were removed. Final angiogram was performed through the right groin sheath. The groin sheath was removed with an Exoseal closure device. There was groin hemostasis at the end of the procedure.  COMPLICATIONS: None  FINDINGS: Superior mesenteric arteriogram: Patient has a metallic biliary stent and IVC filter. Patient has a large replaced right hepatic artery that originates near the origin of the superior mesenteric artery. There are small pancreatic-duodenal branches but no evidence for a vascular malformation or contrast extravasation. The delayed images demonstrate patency of the superior mesenteric veins but there is marked attenuation  and narrowing of the portal confluence and main portal vein. The intrahepatic portal venous system appears to be patent.  Celiac arteriography: The splenic artery is large and patent. There is a prominent left gastric  artery with filling of the right gastric artery. There is mild narrowing and irregularity at the common hepatic artery and proper hepatic artery. There appears to be irregularity of the gastroduodenal artery. There is narrowing involving the origin of the right hepatic artery. There is no evidence for acute extravasation. After a wire was advanced into the common hepatic artery, there was marked narrowing of the common hepatic artery suggestive for vasospasm and minimal flow into the gastroduodenal artery. At one point, there appeared to be retrograde flow into the gastroduodenal artery. After the vasospasm resolved, the flow to the common hepatic artery and gastroduodenal artery returned to baseline.  IMPRESSION: Irregularity of the common hepatic artery, gastroduodenal artery and proper hepatic artery. These findings are related to the surrounding tumor burden. There was extensive vasospasm in the common hepatic artery during this procedure but no evidence for acute bleeding or contrast extravasation. The gastroduodenal artery could never be successfully cannulated and, therefore, an empiric gastroduodenal embolization could not be performed.  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   Electronically Signed   By: Richarda Overlie M.D.   On: 02/18/2013 10:29   Dg C-arm 1-60 Min-no Report  02/16/2013   CLINICAL DATA: stent placement   C-ARM 1-60 MINUTES  Fluoroscopy was utilized by the requesting physician.  No radiographic  interpretation.    Ir Rebekah [place] [facility] [employer] Guide Roadmapping  02/18/2013   INDICATION: History of metastatic pancreatic cancer, now with acute and recurrent upper GI bleeding from the duodenum based on recent endoscopy. Post  failed attempted endoscopic duodenal stent placement and mesenteric arteriogram  EXAM: 1.  CELIAC ARTERIOGRAM  2.  COMMON HEPATIC ARTERIOGRAM (3RD ORDER).  3. SUB SELECTIVE GASTRODUODENAL ARTERIOGRAM (3RD ORDER) WITH PARTICLE AND COIL EMBOLIZATION  4.  ULTRASOUND GUIDANCE FOR ARTERIAL ACCESS  MEDICATIONS: Nitroglycerin 100 mcg IA  TECHNIQUE: Informed written consent was obtained from the patient after a discussion of the risks, benefits and alternatives to treatment. Questions regarding the procedure were encouraged and answered. A timeout was performed prior to the initiation of the procedure.  The right groin was prepped and draped in the usual sterile fashion, and a sterile drape was applied covering the operative field. Maximum barrier sterile technique with sterile gowns and gloves were used for the procedure. A timeout was performed prior to the initiation of the procedure. Local anesthesia was provided with 1% lidocaine.  The right femoral head was marked fluoroscopically. Under ultrasound guidance, the right common femoral artery was accessed with a micropuncture kit after the overlying soft tissues were anesthetized with 1% lidocaine. An ultrasound image was saved for documentation purposes. The micropuncture sheath was exchanged for a 5 [place] vascular sheath over a Bentson wire. A closure arteriogram was performed through the side of the sheath confirming access within the right common femoral artery.  Over a Bentson wire, a Mickelson catheter was advanced to the level of the thoracic aorta where it was back bled and flushed. The catheter was then utilized to select the celiac artery and a celiac arteriogram was performed. 100 mcg of nitroglycerin was not administered intra-arterially through the celiac arterial vascular distribution.  With the use of a Fathom 14 microwire, a regular Renegade microcatheter was advanced into the common hepatic artery and a common hepatic arteriogram was performed. The  microcatheter was then advanced into the distal aspect of the gastroduodenal artery and a sub selective gastroduodenal arteriogram was performed.  Initially, the  distal tributaries of the GDA were embolized with 700-900 micron Embospheres. Limited post particle embolization arteriogram was performed. Subsequently, the gastroduodenal artery was coil embolized to near its origin with a combination of overlapping 2 and 3 mm diameter interlock coils.  The micro catheter was retracted into the common hepatic artery and a repeat common hepatic arteriogram was performed. The micro catheter was removed and a post embolization celiac arteriogram was performed. All images were reviewed and the procedure was terminated. All wires, catheters and sheaths were removed from the patient. Hemostasis was achieved at the right groin access site with manual compression.  ANESTHESIA/SEDATION: Fentanyl 200 mcg IV; Versed 4 mg IV  60 minutes  CONTRAST:  50 cc Omnipaque 300  COMPARISON:  None.  FLUOROSCOPY TIME:  11 min, 42 seconds  COMPLICATIONS: None immediate  FINDINGS: Celiac arteriogram demonstrates persistent irregular narrowing of both the common and proper hepatic arteries extending preferentially involve the origin of the right hepatic artery.  The gastroduodenal artery remains patent though irregularly narrowed throughout its course.  The gastroduodenal artery was successfully cannulated and embolized with a combination of 700 to 900 micron Embospheres and overlapping interlock coils to near its origin. 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  IMPRESSION: 1. Successful prophylactic particle and coil embolization of the gastroduodenal artery secondary to recurrent and refractory upper GI (duodenal) bleed due to malignant invasion from patient's known pancreatic head carcinoma. 2. While the common and proper hepatic arteries remain irregularly narrowed throughout their course, I  do not feel that these vessels are contributing to the patient's current hemodynamic instability and as such were not embolized.  PLAN: Pending the patient's wishes, would recommend continued aggressive conservative management. 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   Electronically Signed   By: Simonne Come M.D.   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 CT angiography is performed to assess vasculature prior to potential arteriography and transcatheter embolization to treat persistent bleeding.  EXAM: CT ANGIOGRAPHY ABDOMEN AND PELVIS WITH CONTRAST AND WITHOUT CONTRAST  TECHNIQUE: Multidetector CT imaging of the abdomen and pelvis was performed using the standard protocol during bolus administration of intravenous contrast. Multiplanar reconstructed images including MIPs were obtained and reviewed to evaluate the vascular anatomy.  CONTRAST:  OMNIPAQUE IOHEXOL 350 MG/ML SOLN  COMPARISON:  12/23/2012  FINDINGS: There is significant enlargement of the mass involving the head of the pancreas since the prior CT in October. Dimensions of the mass are now approximately 3.7 x 5.6 x 4.5 cm. The mass visibly encases the pre-existing biliary stent and also extends further to encase the gastroduodenal artery, portal venous confluence, hepatic artery and a segment of the superior mesenteric vein. 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 Portal flow is present in the right lobe.  Significant progression of metastatic disease in the liver is also  identified in both left and right lobes. Index lesion in the inferior right lobe shows enlargement with dimensions of 2.5 x 3.5 cm (1.6 x 2.1 cm previously). The largest left lobe lesion has enlarged from a maximum diameter of 2.4 cm previously to estimated current maximum diameter of 4.0 cm. All previously identified metastatic lesions have increased significantly in size. There are at least 4 new metastatic lesions identified in the liver. There is no evidence of associated biliary obstruction with air remaining a biliary tree. The common bile duct stent lumen does not appear to be compromised.  There is new ascites with mild to moderate overall volume of ascites present as well as nodularity of the mesenteric and omentum suspicious for carcinomatosis. No bowel obstruction or perforation is identified.  Arterial vasculature evaluated by CTA shows no evidence of significant occlusive disease. The aorta and major visceral branches are normally patent. At the level of the celiac trunk, the common hepatic artery is narrowed and encased by tumor but remains open with flow noted in both right and left hepatic arteries. The gastroduodenal artery is open but encased by tumor. Small distal GDA branches are present supplying the pancreatic mass. There also appears to be pancreaticoduodenal supply from the SMA at the level of the pancreatic mass.  The superior mesenteric artery abuts tumor but is not visibly narrowed. Focal nonocclusive thrombus or direct tumor invasion is identified in the superior aspect of the superior mesenteric vein causing luminal stenosis of approximately 60%. The splenic vein remains open.  Bilateral renal artery show normal patency. The inferior mesenteric vein is open. Bilateral iliac  and common femoral arteries show normal patency.  Appearance of the gallbladder and kidneys are unremarkable. The spleen shows a new focal wedge-shaped infarct in its posterior and inferior aspect. The rest of the spleen shows normal perfusion. There is a stable left adrenal nodule. Bony structures show degenerative changes of the spine without evidence of focal lesions.  Review of the MIP images confirms the above findings.  IMPRESSION: 1. Significant progression of metastatic pancreatic carcinoma with enlargement of the mass at the level of the head of the pancreas and progression of hepatic metastatic disease. 2. 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 Tumor also encases and narrows the common hepatic artery, right and left hepatic arteries and gastroduodenal artery. There also is tumor involvement at the level of the superior mesenteric vein. 3. 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   Electronically Signed   By: Irish Lack M.D.   On: 02/11/2013 10:34         Subjective: No complaints. States this is the best he has felt since he has been here. Had a BM earlier today without evidence of blood.  Objective: Filed Vitals:   02/20/13 2019 02/21/13 0512 02/21/13 1335 02/21/13 1420  BP: 131/78 141/76 137/86 130/85  Pulse: 91 98 93   Temp: 99.2 F (37.3 C) 98.6 F (37 C) 98 F (36.7 C) 98.3 F (36.8 C)  TempSrc: Oral Oral Oral Oral  Resp: 18 16 18 22   Height:      Weight:      SpO2: 97% 97% 100%     Intake/Output Summary (Last 24 hours) at 02/21/13 1430 Last data filed at 02/21/13 1420  Gross per 24 hour  Intake 1462.5 ml  Output      0 ml  Net 1462.5 ml  Weight change:  Exam:   General:  Pt is alert, follows commands appropriately, not in acute distress  HEENT: No icterus, No thrush,  [place]/AT  Cardiovascular: RRR, S1/S2, no rubs, no  gallops  Respiratory: CTA bilaterally, no wheezing, no crackles, no rhonchi  Abdomen: Soft/+BS, non tender, non distended, no guarding  Extremities: 1+ edema, No lymphangitis, No petechiae, No rashes, no synovitis  Data Reviewed: Basic Metabolic Panel:  Recent Labs Lab 02/15/13 1036 02/17/13 1903 02/18/13 0130  NA 133* 134* 139  K 3.8 3.4* 3.6  CL 104 106 110  CO2 18* 21 22  GLUCOSE 135* 138* 106*  BUN 14 13 12   CREATININE 0.89 0.74 0.77  CALCIUM 8.1* 8.1* 8.6   Liver Function Tests:  Recent Labs Lab 02/15/13 1036  AST 25  ALT 21  ALKPHOS 81  BILITOT 0.4  PROT 5.0*  ALBUMIN 2.2*   No results found for this basename: LIPASE, AMYLASE,  in the last 168 hours No results found for this basename: AMMONIA,  in the last 168 hours CBC:  Recent Labs Lab 02/18/13 2210 02/19/13 0300 02/19/13 0800 02/20/13 0600 02/21/13 0625  WBC 10.0 8.7 8.6 7.3 7.2  HGB 9.5* 9.2* 9.5* 9.0* 8.9*  HCT 28.3* 27.0* 27.6* 26.6* 26.4*  MCV 87.1 87.9 87.9 88.4 88.3  PLT 84* 85* 84* 84* 63*   Cardiac Enzymes: No results found for this basename: CKTOTAL, CKMB, CKMBINDEX, TROPONINI,  in the last 168 hours BNP: No components found with this basename: POCBNP,  CBG: No results found for this basename: GLUCAP,  in the last 168 hours  Recent Results (from the past 240 hour(s))  CLOSTRIDIUM DIFFICILE BY PCR     Status: None   Collection Time    02/13/13  1:10 PM      Result Value Range Status   C difficile by pcr NEGATIVE  NEGATIVE Final   Comment: Performed at [facility]     Scheduled Meds: . folic acid  2 mg Oral Daily  . lipase/protease/amylase  2 capsule Oral TID AC & HS  . pantoprazole  80 mg Oral Daily  . sodium chloride  3 mL Intravenous Q12H  . sucralfate  2 g Oral Q6H   Continuous Infusions: . sodium chloride 30 mL/hr at 02/21/13 1035     Chaya Jan, MD  [facility] Pager [phone]  If 7PM-7AM, please contact  night-coverage [email or web address] Password [email or web address] 02/21/2013, 2:30 PM   LOS: 6 days

## 2013-02-22 ENCOUNTER — Ambulatory Visit
Admit: 2013-02-22 | Discharge: 2013-02-22 | Disposition: A | Discharge status: Home / Self Care | Payer: BC Managed Care – PPO | Attending: Radiation Oncology | Admitting: Radiation Oncology

## 2013-02-22 LAB — COMPREHENSIVE METABOLIC PANEL
ALT: 24 U/L (ref 0–53)
AST: 28 U/L (ref 0–37)
Albumin: 2.2 g/dL — ABNORMAL LOW (ref 3.5–5.2)
CO2: 20 mEq/L (ref 19–32)
Calcium: 8.4 mg/dL (ref 8.4–10.5)
Creatinine, Ser: 0.83 mg/dL (ref 0.50–1.35)
GFR calc Af Amer: >90 mL/min (ref >90)
GFR calc non Af Amer: >90 mL/min (ref >90)
Sodium: 137 mEq/L (ref 135–145)
Total Protein: 5.4 g/dL — ABNORMAL LOW (ref 6.0–8.3)

## 2013-02-22 LAB — CBC
HCT: 26 % — ABNORMAL LOW (ref 39.0–52.0)
MCH: 29.9 pg (ref 26.0–34.0)
MCV: 89.3 fL (ref 78.0–100.0)
Platelets: 85 10*3/uL — ABNORMAL LOW (ref 150–400)
RDW: 17.2 % — ABNORMAL HIGH (ref 11.5–15.5)
WBC: 8.1 10*3/uL (ref 4.0–10.5)

## 2013-02-22 LAB — PREPARE PLATELET PHERESIS

## 2013-02-22 LAB — PROTIME-INR: INR: 1.47 (ref 0.00–1.49)

## 2013-02-22 LAB — PREPARE RBC (CROSSMATCH)

## 2013-02-22 LAB — APTT: aPTT: 33 seconds (ref 24–37)

## 2013-02-22 LAB — CANCER ANTIGEN 19-9: CA 19-9: >140000 U/mL — ABNORMAL HIGH (ref <35.0)

## 2013-02-22 LAB — PREALBUMIN: Prealbumin: 7.5 mg/dL — ABNORMAL LOW (ref 17.0–34.0)

## 2013-02-22 MED ORDER — ACETAMINOPHEN 325 MG PO TABS
650.0000 mg | ORAL_TABLET | Freq: Once | ORAL | Status: AC
  Administered 2013-02-22: 650 mg via ORAL
  Filled 2013-02-22: qty 2

## 2013-02-22 MED ORDER — FUROSEMIDE 10 MG/ML IJ SOLN
10.0000 mg | Freq: Once | INTRAMUSCULAR | Status: AC
  Administered 2013-02-22: 10 mg via INTRAVENOUS
  Filled 2013-02-22: qty 1

## 2013-02-22 NOTE — Progress Notes (Signed)
PT Cancellation Note   Patient Details Name: Austin Escobar MRN: [id number] DOB: 08/08/1955   Cancelled Treatment:    Reason Eval/Treat Not Completed: PT screened, no needs identified, will sign off Pt adamantly declines need for therapy.     Akire Rennert,KATHrine E 02/22/2013, 11:22 AM Zenovia Jarred, PT, DPT 02/22/2013 Pager: [phone]

## 2013-02-22 NOTE — Progress Notes (Signed)
OT Cancellation Note  Patient Details Name: TYLOR COURTWRIGHT MRN: [id number] DOB: 04-09-1955   Cancelled Treatment:    Reason Eval/Treat Not Completed: OT screened, no needs identified, will sign off  Aidan Caloca, [place] Kung 02/22/2013, 9:48 AM

## 2013-02-22 NOTE — Progress Notes (Signed)
It looks like Austin Escobar had a bloody bowel movement last night. He is doing pretty well all day long. He had eaten okay. There is no abdominal pain. I did give him some platelets yesterday. He did not have any additional episodes after last night. His hemoglobin is down to 8.7. I will go ahead and transfuse him 2 units of blood. I this may help  With the hemostasis. He'll have radiation therapy.  He is having frequent bowel movements yesterday. We put him on some Imodium. He does continue on the Creon.  Is had no cough or shortness of breath.  He is out of bed a little bit.  He's had no fever.  His vital signs are stable. Temperature 90.3. Blood pressure 120/79. Pulse is 92. Lungs are clear. Cardiac exam regular rate and rhythm with no murmurs rubs or bruits. Abdomen is soft. May be some slight distention. Bowel sounds are active. There is no fluid wave. There is no abdominal mass. There is no palpable hepatomegaly. Extremities shows some trace edema.  His platelet count 85,000.  Again, we will give him 2 units of blood. He continues on folic acid.  Hopefully, we will be able to get him home soon. If this is still a little bit of a tenuous circumstance with this intermittent bleeding. He has not bled for  2 or 3 days and then have an episode last night. I suppose this might be expected from here on out.  We will continue pray about this.  I appreciate all the great care that he is getting from the staff on [address]!!  Austin E.  Romans 5:3-5

## 2013-02-22 NOTE — Progress Notes (Signed)
[facility] PROGRESS NOTE  Austin Escobar WUJ:[id number] DOB: 1955-03-29 DOA: 02/15/2013 PCP: [name], DO  Interim history  57 y.o. 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 Due to poor prognosis, patient was referred for possible trial at [facility] by Dr. [name] (oncologist), although not yet accepted. He was DNR/DNI. Readmit on 12/15 and wanted to change to FULL CODE. Recurrent GIB beginning on the evening of 12/14, with blood mixed with stool and then progressed with 3-4 episodes of frank bloody clots. He had subsequent dizziness. Hgb on admission was  7.3 (was 9.0 on discharge) , requiring 2 units of blood. He also received 2 units of FFP 02/16/2013 due to INR 1.7. He has had a bowel movement this morning without any visible blood. Dr. [name] attempted duodenal stent to assist with tamponade of bleed, but this was unsuccessful due to delivery system being too short.  Assessment/Plan: Acute blood loss anemia  -2 additional units PRBCs 12/22. -Has received 9 units PRBCs during this admission; 2units FFP; 1 unit of platelets. -Underwent embolization 12/18 of the GDA by IR 12/18. -Has had some diarrhea overnight and noted blood in stool for the first time in days. -Hb 8.7 today; Dr. [name] has ordered 2 units of PRBCs to be transfused. -appreciate GI and IR followup  -Avoid NSAIDs  -Hemoglobin has remained fairly stable. Patient is hemodynamically stable  -Full liquid diet for now  -origin of bleed likely from duodenal bulb where there is tumor invasion   GI bleed -As above.  Coagulopathy  -Received 2 units FFP 02/16/2013 when his INR was 1.7  -vitamin K given 12/18  Thrombocytopenia -Received 1 unit of platelets on 12/21.  Esophageal varices  -No bleeding noted from previous endoscopy  -Continue Protonix and Carafate   Saddle pulmonary embolus  -12/23/2012  -Status post IVC filter in December 2014  -Currently stable without any respiratory distress or hypoxemia   Atrial fibrillation  -Status post ablation 05/25/2012  -Currently in sinus rhythm  -Not a candidate for anticoagulation   Metastatic pancreatic adenocarcinoma with duodenal invasion  -Appreciate oncology followup  -continue radiation therapy. -Dr. [name] is following   -LIMITED CODE--NO CPR, NO SHOCK, NO INTUBATION, YES VASOPRESSOR MEDS  HTN  -Metoprolol tartrate is on hold due to soft blood pressures   Family Communication:  Patient only Disposition Plan: Anticipate DC home once medically ready.     Procedures/Studies: Ir Angiogram Visceral  Selective  02/18/2013   INDICATION: History of metastatic pancreatic cancer, now with acute and recurrent upper GI bleeding from the duodenum based on recent endoscopy. Post failed attempted endoscopic duodenal stent placement and mesenteric arteriogram  EXAM: 1.  CELIAC ARTERIOGRAM  2.  COMMON HEPATIC ARTERIOGRAM (3RD ORDER).  3. SUB SELECTIVE GASTRODUODENAL ARTERIOGRAM (3RD ORDER) WITH PARTICLE AND COIL EMBOLIZATION  4.  ULTRASOUND GUIDANCE FOR ARTERIAL ACCESS  MEDICATIONS: Nitroglycerin 100 mcg IA  TECHNIQUE: Informed written consent was obtained from the patient after a discussion of the risks, benefits and alternatives to treatment. Questions regarding the procedure were encouraged and answered. A timeout was performed prior to the initiation of the procedure.  The right groin was prepped and draped in the usual sterile fashion, and a sterile drape was applied covering the operative field. Maximum barrier sterile technique with sterile gowns and gloves were used for the procedure. A timeout was performed prior to the initiation of the procedure. Local anesthesia was provided with 1% lidocaine.  The right femoral head was marked fluoroscopically. Under ultrasound guidance, the right common femoral artery was accessed with a micropuncture kit after the overlying soft tissues were anesthetized with 1% lidocaine. An ultrasound image was saved for documentation purposes. The micropuncture sheath was exchanged for a 5 [place] vascular sheath over a Bentson wire. A closure arteriogram was performed through the side of the sheath confirming access within the right common femoral artery.  Over a Bentson wire, a Mickelson catheter was advanced to the level of the thoracic aorta where it was back bled and flushed. The catheter was then utilized to select the celiac artery and a celiac arteriogram was performed. 100 mcg of nitroglycerin was not administered intra-arterially through the celiac arterial vascular  distribution.  With the use of a Fathom 14 microwire, a regular Renegade microcatheter was advanced into the common hepatic artery and a common hepatic arteriogram was performed. The microcatheter was then advanced into the distal aspect of the gastroduodenal artery and a sub selective gastroduodenal arteriogram was performed.  Initially, the distal tributaries of the GDA were embolized with 700-900 micron Embospheres. Limited post particle embolization arteriogram was performed. Subsequently, the gastroduodenal artery was coil embolized to near its origin with a combination of overlapping 2 and 3 mm diameter interlock coils.  The micro catheter was retracted into the common hepatic artery and a repeat common hepatic arteriogram was performed. The micro catheter was removed and a post embolization celiac arteriogram was performed. All images were reviewed and the procedure was terminated. All wires, catheters and sheaths were removed from the patient. Hemostasis was achieved at the right groin access site with manual compression.  ANESTHESIA/SEDATION: Fentanyl 200 mcg IV; Versed 4 mg IV  60 minutes  CONTRAST:  50 cc Omnipaque 300  COMPARISON:  None.  FLUOROSCOPY TIME:  11 min, 42 seconds  COMPLICATIONS: None immediate  FINDINGS: Celiac arteriogram demonstrates persistent irregular narrowing of both the common and proper hepatic arteries extending preferentially involve the origin of the right hepatic artery.  The gastroduodenal artery remains patent though irregularly narrowed throughout its course.  The gastroduodenal artery was successfully cannulated and embolized with a combination of 700 to 900 micron Embospheres and overlapping interlock coils to near its origin. 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  IMPRESSION: 1. Successful prophylactic particle and coil embolization of the gastroduodenal artery secondary to  recurrent and refractory upper GI (duodenal)  bleed due to malignant invasion from patient's known pancreatic head carcinoma. 2. While the common and proper hepatic arteries remain irregularly narrowed throughout their course, I do not feel that these vessels are contributing to the patient's current hemodynamic instability and as such were not embolized.  PLAN: Pending the patient's wishes, would recommend continued aggressive conservative management. 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   Electronically Signed   By: Simonne Come M.D.   On: 02/18/2013 13:58   Ir Angiogram Visceral Selective  02/18/2013   CLINICAL DATA:  57 year old with metastatic pancreatic cancer. Acute and recurrent upper GI bleeding. The patient has had previous duodenal bleeding based on endoscopy.  EXAM: VISCERAL ARTERIOGRAPHY ; SELECTIVE VISCERAL ARTERIOGRAPHY ; ULTRASOUND GUIDANCE FOR VASCULAR ACCESS  Physician: Rachelle Hora. Henn, MD  MEDICATIONS: Versed 5 mg, fentanyl 100 mcg. A radiology nurse monitored the patient for moderate sedation.  ANESTHESIA/SEDATION: Moderate sedation time: 1 hr and 45 min  CONTRAST:  85 mL Omnipaque 300  FLUOROSCOPY TIME:  29 min and 30 seconds  PROCEDURE: The procedure was explained to the patient. The risks and benefits of the procedure were discussed and the patient's questions were addressed. Informed consent was obtained from the patient. Patient was brought to the interventional suite. The right groin was prepped and draped in sterile fashion. Maximal barrier sterile technique was utilized including caps, mask, sterile gowns, sterile  gloves, sterile drape, hand hygiene and skin antiseptic. The skin was anesthetized with 1% lidocaine. Using ultrasound guidance, a 21 gauge needle was directed into the right common femoral artery and a micropuncture dilator set was placed. A 5 French vascular sheath was placed over a Bentson wire. The superior mesenteric artery was cannulated with a C2 catheter. Superior mesenteric arteriogram was performed. The C2 catheter was used to cannulate the celiac trunk. Celiac arteriography was performed. A Glidewire was advanced into the common hepatic artery and the C2 catheter was advanced into the proximal common hepatic artery. At this point, there appeared to be extensive spasm in the common hepatic artery. C2 catheter was pulled back to the celiac trunk origin and additional angiography was performed. Multiple attempts were made to cannulate the common hepatic artery with a Renegade micro catheter. Cannulating the common hepatic artery was very difficult with the micro catheter. As result, the C2 catheter was exchanged for a Sos catheter. Eventually, a glide micro wire was advanced into the common hepatic artery and proper hepatic artery. The Renegade catheter was advanced to the common hepatic artery and additional angiography was performed. Catheter was also advanced into the proper hepatic artery and additional angiography was performed. Multiple attempts were made to cannulate the gastroduodenal artery with a wire and catheter. These attempts at cannulating the gastroduodenal artery were unsuccessful. The micro catheter and 5 French catheter were removed. Final angiogram was performed through the right groin sheath. The groin sheath was removed with an Exoseal closure device. There was groin hemostasis at the end of the procedure.  COMPLICATIONS: None  FINDINGS: Superior mesenteric arteriogram: Patient has a metallic biliary stent and IVC filter. Patient has a  large replaced right hepatic artery that originates  near the origin of the superior mesenteric artery. There are small pancreatic-duodenal branches but no evidence for a vascular malformation or contrast extravasation. The delayed images demonstrate patency of the superior mesenteric veins but there is marked attenuation and narrowing of the portal confluence and main portal vein. The intrahepatic portal venous system appears to be patent.  Celiac arteriography: The splenic artery is large and patent. There is a prominent left gastric artery with filling of the right gastric artery. There is mild narrowing and irregularity at the common hepatic artery and proper hepatic artery. There appears to be irregularity of the gastroduodenal artery. There is narrowing involving the origin of the right hepatic artery. There is no evidence for acute extravasation. After a wire was advanced into the common hepatic artery, there was marked narrowing of the common hepatic artery suggestive for vasospasm and minimal flow into the gastroduodenal artery. At one point, there appeared to be retrograde flow into the gastroduodenal artery. After the vasospasm resolved, the flow to the common hepatic artery and gastroduodenal artery returned to baseline.  IMPRESSION: Irregularity of the common hepatic artery, gastroduodenal artery and proper hepatic artery. These findings are related to the surrounding tumor burden. There was extensive vasospasm in the common hepatic artery during this procedure but no evidence for acute bleeding or contrast extravasation. The gastroduodenal artery could never be successfully cannulated and, therefore, an empiric gastroduodenal embolization could not be performed.  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   Electronically Signed   By: Richarda Overlie M.D.   On: 02/18/2013 10:29   Ir Angiogram Visceral Selective  02/18/2013   CLINICAL DATA:  57 year old with metastatic pancreatic cancer. Acute and recurrent upper GI  bleeding. The patient has had previous duodenal bleeding based on endoscopy.  EXAM: VISCERAL ARTERIOGRAPHY ; SELECTIVE VISCERAL ARTERIOGRAPHY ; ULTRASOUND GUIDANCE FOR VASCULAR ACCESS  Physician: Rachelle Hora. Henn, MD  MEDICATIONS: Versed 5 mg, fentanyl 100 mcg. A radiology nurse monitored the patient for moderate sedation.  ANESTHESIA/SEDATION: Moderate sedation time: 1 hr and 45 min  CONTRAST:  85 mL Omnipaque 300  FLUOROSCOPY TIME:  29 min and 30 seconds  PROCEDURE: The procedure was explained to the patient. The risks and benefits of the procedure were discussed and the patient's questions were addressed. Informed consent was obtained from the patient. Patient was brought to the interventional suite. The right groin was prepped and draped in sterile fashion. Maximal barrier sterile technique was utilized including caps, mask, sterile gowns, sterile gloves, sterile drape, hand hygiene and skin antiseptic. The skin was anesthetized with 1% lidocaine. Using ultrasound guidance, a 21 gauge needle was directed into the right common femoral artery and a micropuncture dilator set was placed. A 5 French vascular sheath was placed over a Bentson wire. The superior mesenteric artery was cannulated with a C2 catheter. Superior mesenteric arteriogram was performed. The C2 catheter was used to cannulate the celiac trunk. Celiac arteriography was performed. A Glidewire was advanced into the common hepatic artery and the C2 catheter was advanced into the proximal common hepatic artery. At this point, there appeared to be extensive spasm in the common hepatic artery. C2 catheter was pulled back to the celiac trunk origin and additional angiography was performed. Multiple attempts were made to cannulate the common hepatic artery with a Renegade micro catheter. Cannulating the common hepatic artery was very difficult with the micro catheter. As result, the C2 catheter was exchanged  for a Sos catheter. Eventually, a glide micro wire  was advanced into the common hepatic artery and proper hepatic artery. The Renegade catheter was advanced to the common hepatic artery and additional angiography was performed. Catheter was also advanced into the proper hepatic artery and additional angiography was performed. Multiple attempts were made to cannulate the gastroduodenal artery with a wire and catheter. These attempts at cannulating the gastroduodenal artery were unsuccessful. The micro catheter and 5 French catheter were removed. Final angiogram was performed through the right groin sheath. The groin sheath was removed with an Exoseal closure device. There was groin hemostasis at the end of the procedure.  COMPLICATIONS: None  FINDINGS: Superior mesenteric arteriogram: Patient has a metallic biliary stent and IVC filter. Patient has a large replaced right hepatic artery that originates near the origin of the superior mesenteric artery. There are small pancreatic-duodenal branches but no evidence for a vascular malformation or contrast extravasation. The delayed images demonstrate patency of the superior mesenteric veins but there is marked attenuation and narrowing of the portal confluence and main portal vein. The intrahepatic portal venous system appears to be patent.  Celiac arteriography: The splenic artery is large and patent. There is a prominent left gastric artery with filling of the right gastric artery. There is mild narrowing and irregularity at the common hepatic artery and proper hepatic artery. There appears to be irregularity of the gastroduodenal artery. There is narrowing involving the origin of the right hepatic artery. There is no evidence for acute extravasation. After a wire was advanced into the common hepatic artery, there was marked narrowing of the common hepatic artery suggestive for vasospasm and minimal flow into the gastroduodenal artery. At one point, there appeared to be retrograde flow into the gastroduodenal artery.  After the vasospasm resolved, the flow to the common hepatic artery and gastroduodenal artery returned to baseline.  IMPRESSION: Irregularity of the common hepatic artery, gastroduodenal artery and proper hepatic artery. These findings are related to the surrounding tumor burden. There was extensive vasospasm in the common hepatic artery during this procedure but no evidence for acute bleeding or contrast extravasation. The gastroduodenal artery could never be successfully cannulated and, therefore, an empiric gastroduodenal embolization could not be performed.  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   Electronically Signed   By: Richarda Overlie M.D.   On: 02/18/2013 10:29   Ir Angiogram Selective Each Additional Vessel  02/18/2013   INDICATION: History of metastatic pancreatic cancer, now with acute and recurrent upper GI bleeding from the duodenum based on recent endoscopy. Post failed attempted endoscopic duodenal stent placement and mesenteric arteriogram  EXAM: 1.  CELIAC ARTERIOGRAM  2.  COMMON HEPATIC ARTERIOGRAM (3RD ORDER).  3. SUB SELECTIVE GASTRODUODENAL ARTERIOGRAM (3RD ORDER) WITH PARTICLE AND COIL EMBOLIZATION  4.  ULTRASOUND GUIDANCE FOR ARTERIAL ACCESS  MEDICATIONS: Nitroglycerin 100 mcg IA  TECHNIQUE: Informed written consent was obtained from the patient after a discussion of the risks, benefits and alternatives to treatment. Questions regarding the procedure were encouraged and answered. A timeout was performed prior to the initiation of the procedure.  The right groin was prepped and draped in the usual sterile fashion, and a sterile drape was applied covering the operative field. Maximum barrier sterile technique with sterile gowns and gloves were used for the procedure. A timeout was performed prior to the initiation of the procedure. Local anesthesia was provided with 1% lidocaine.  The right femoral head was marked fluoroscopically. Under  ultrasound  guidance, the right common femoral artery was accessed with a micropuncture kit after the overlying soft tissues were anesthetized with 1% lidocaine. An ultrasound image was saved for documentation purposes. The micropuncture sheath was exchanged for a 5 [place] vascular sheath over a Bentson wire. A closure arteriogram was performed through the side of the sheath confirming access within the right common femoral artery.  Over a Bentson wire, a Mickelson catheter was advanced to the level of the thoracic aorta where it was back bled and flushed. The catheter was then utilized to select the celiac artery and a celiac arteriogram was performed. 100 mcg of nitroglycerin was not administered intra-arterially through the celiac arterial vascular distribution.  With the use of a Fathom 14 microwire, a regular Renegade microcatheter was advanced into the common hepatic artery and a common hepatic arteriogram was performed. The microcatheter was then advanced into the distal aspect of the gastroduodenal artery and a sub selective gastroduodenal arteriogram was performed.  Initially, the distal tributaries of the GDA were embolized with 700-900 micron Embospheres. Limited post particle embolization arteriogram was performed. Subsequently, the gastroduodenal artery was coil embolized to near its origin with a combination of overlapping 2 and 3 mm diameter interlock coils.  The micro catheter was retracted into the common hepatic artery and a repeat common hepatic arteriogram was performed. The micro catheter was removed and a post embolization celiac arteriogram was performed. All images were reviewed and the procedure was terminated. All wires, catheters and sheaths were removed from the patient. Hemostasis was achieved at the right groin access site with manual compression.  ANESTHESIA/SEDATION: Fentanyl 200 mcg IV; Versed 4 mg IV  60 minutes  CONTRAST:  50 cc Omnipaque 300  COMPARISON:  None.  FLUOROSCOPY TIME:  11 min, 42  seconds  COMPLICATIONS: None immediate  FINDINGS: Celiac arteriogram demonstrates persistent irregular narrowing of both the common and proper hepatic arteries extending preferentially involve the origin of the right hepatic artery.  The gastroduodenal artery remains patent though irregularly narrowed throughout its course.  The gastroduodenal artery was successfully cannulated and embolized with a combination of 700 to 900 micron Embospheres and overlapping interlock coils to near its origin. 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  IMPRESSION: 1. Successful prophylactic particle and coil embolization of the gastroduodenal artery secondary to recurrent and refractory upper GI (duodenal) bleed due to malignant invasion from patient's known pancreatic head carcinoma. 2. While the common and proper hepatic arteries remain irregularly narrowed throughout their course, I do not feel that these vessels are contributing to the patient's current hemodynamic instability and as such were not embolized.  PLAN: Pending the patient's wishes, would recommend continued aggressive conservative management. 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   Electronically Signed   By: Simonne Come M.D.   On: 02/18/2013 13:58   Ir Angiogram Selective Each Additional Vessel  02/18/2013   CLINICAL DATA:  57 year old with metastatic pancreatic cancer. Acute and recurrent upper GI bleeding. The patient has had previous duodenal bleeding based on endoscopy.  EXAM: VISCERAL  ARTERIOGRAPHY ; SELECTIVE VISCERAL ARTERIOGRAPHY ; ULTRASOUND GUIDANCE FOR  VASCULAR ACCESS  Physician: Rachelle Hora. Henn, MD  MEDICATIONS: Versed 5 mg, fentanyl 100 mcg. A radiology nurse monitored the patient for moderate sedation.  ANESTHESIA/SEDATION: Moderate sedation time: 1 hr and 45 min  CONTRAST:  85 mL Omnipaque 300  FLUOROSCOPY TIME:  29 min and 30 seconds  PROCEDURE: The procedure was explained to the patient. The risks and benefits of the procedure were discussed and the patient's questions were addressed. Informed consent was obtained from the patient. Patient was brought to the interventional suite. The right groin was prepped and draped in sterile fashion. Maximal barrier sterile technique was utilized including caps, mask, sterile gowns, sterile gloves, sterile drape, hand hygiene and skin antiseptic. The skin was anesthetized with 1% lidocaine. Using ultrasound guidance, a 21 gauge needle was directed into the right common femoral artery and a micropuncture dilator set was placed. A 5 French vascular sheath was placed over a Bentson wire. The superior mesenteric artery was cannulated with a C2 catheter. Superior mesenteric arteriogram was performed. The C2 catheter was used to cannulate the celiac trunk. Celiac arteriography was performed. A Glidewire was advanced into the common hepatic artery and the C2 catheter was advanced into the proximal common hepatic artery. At this point, there appeared to be extensive spasm in the common hepatic artery. C2 catheter was pulled back to the celiac trunk origin and additional angiography was performed. Multiple attempts were made to cannulate the common hepatic artery with a Renegade micro catheter. Cannulating the common hepatic artery was very difficult with the micro catheter. As result, the C2 catheter was exchanged for a Sos catheter. Eventually, a glide micro wire was advanced into the common hepatic artery and proper hepatic artery. The Renegade catheter  was advanced to the common hepatic artery and additional angiography was performed. Catheter was also advanced into the proper hepatic artery and additional angiography was performed. Multiple attempts were made to cannulate the gastroduodenal artery with a wire and catheter. These attempts at cannulating the gastroduodenal artery were unsuccessful. The micro catheter and 5 French catheter were removed. Final angiogram was performed through the right groin sheath. The groin sheath was removed with an Exoseal closure device. There was groin hemostasis at the end of the procedure.  COMPLICATIONS: None  FINDINGS: Superior mesenteric arteriogram: Patient has a metallic biliary stent and IVC filter. Patient has a large replaced right hepatic artery that originates near the origin of the superior mesenteric artery. There are small pancreatic-duodenal branches but no evidence for a vascular malformation or contrast extravasation. The delayed images demonstrate patency of the superior mesenteric veins but there is marked attenuation and narrowing of the portal confluence and main portal vein. The intrahepatic portal venous system appears to be patent.  Celiac arteriography: The splenic artery is large and patent. There is a prominent left gastric artery with filling of the right gastric artery. There is mild narrowing and irregularity at the common hepatic artery and proper hepatic artery. There appears to be irregularity of the gastroduodenal artery. There is narrowing involving the origin of the right hepatic artery. There is no evidence for acute extravasation. After a wire was advanced into the common hepatic artery, there was marked narrowing of the common hepatic artery suggestive for vasospasm and minimal flow into the gastroduodenal artery. At one point, there appeared to be retrograde flow into the gastroduodenal artery. After the vasospasm resolved, the flow to the common hepatic artery and gastroduodenal artery  returned to baseline.  IMPRESSION: Irregularity of the common hepatic  artery, gastroduodenal artery and proper hepatic artery. These findings are related to the surrounding tumor burden. There was extensive vasospasm in the common hepatic artery during this procedure but no evidence for acute bleeding or contrast extravasation. The gastroduodenal artery could never be successfully cannulated and, therefore, an empiric gastroduodenal embolization could not be performed.  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   Electronically Signed   By: Richarda Overlie M.D.   On: 02/18/2013 10:29   Ir Angiogram Selective Each Additional Vessel  02/18/2013   CLINICAL DATA:  57 year old with metastatic pancreatic cancer. Acute and recurrent upper GI bleeding. The patient has had previous duodenal bleeding based on endoscopy.  EXAM: VISCERAL ARTERIOGRAPHY ; SELECTIVE VISCERAL ARTERIOGRAPHY ; ULTRASOUND GUIDANCE FOR VASCULAR ACCESS  Physician: Rachelle Hora. Henn, MD  MEDICATIONS: Versed 5 mg, fentanyl 100 mcg. A radiology nurse monitored the patient for moderate sedation.  ANESTHESIA/SEDATION: Moderate sedation time: 1 hr and 45 min  CONTRAST:  85 mL Omnipaque 300  FLUOROSCOPY TIME:  29 min and 30 seconds  PROCEDURE: The procedure was explained to the patient. The risks and benefits of the procedure were discussed and the patient's questions were addressed. Informed consent was obtained from the patient. Patient was brought to the interventional suite. The right groin was prepped and draped in sterile fashion. Maximal barrier sterile technique was utilized including caps, mask, sterile gowns, sterile gloves, sterile drape, hand hygiene and skin antiseptic. The skin was anesthetized with 1% lidocaine. Using ultrasound guidance, a 21 gauge needle was directed into the right common femoral artery and a micropuncture dilator set was placed. A 5 French vascular sheath was placed over a Bentson wire. The  superior mesenteric artery was cannulated with a C2 catheter. Superior mesenteric arteriogram was performed. The C2 catheter was used to cannulate the celiac trunk. Celiac arteriography was performed. A Glidewire was advanced into the common hepatic artery and the C2 catheter was advanced into the proximal common hepatic artery. At this point, there appeared to be extensive spasm in the common hepatic artery. C2 catheter was pulled back to the celiac trunk origin and additional angiography was performed. Multiple attempts were made to cannulate the common hepatic artery with a Renegade micro catheter. Cannulating the common hepatic artery was very difficult with the micro catheter. As result, the C2 catheter was exchanged for a Sos catheter. Eventually, a glide micro wire was advanced into the common hepatic artery and proper hepatic artery. The Renegade catheter was advanced to the common hepatic artery and additional angiography was performed. Catheter was also advanced into the proper hepatic artery and additional angiography was performed. Multiple attempts were made to cannulate the gastroduodenal artery with a wire and catheter. These attempts at cannulating the gastroduodenal artery were unsuccessful. The micro catheter and 5 French catheter were removed. Final angiogram was performed through the right groin sheath. The groin sheath was removed with an Exoseal closure device. There was groin hemostasis at the end of the procedure.  COMPLICATIONS: None  FINDINGS: Superior mesenteric arteriogram: Patient has a metallic biliary stent and IVC filter. Patient has a large replaced right hepatic artery that originates near the origin of the superior mesenteric artery. There are small pancreatic-duodenal branches but no evidence for a vascular malformation or contrast extravasation. The delayed images demonstrate patency of the superior mesenteric veins but there is marked attenuation and narrowing of the portal  confluence and main portal vein. The intrahepatic portal venous system appears to be patent.  Celiac arteriography: The splenic artery is large and patent. There is a prominent left gastric artery with filling of the right gastric artery. There is mild narrowing and irregularity at the common hepatic artery and proper hepatic artery. There appears to be irregularity of the gastroduodenal artery. There is narrowing involving the origin of the right hepatic artery. There is no evidence for acute extravasation. After a wire was advanced into the common hepatic artery, there was marked narrowing of the common hepatic artery suggestive for vasospasm and minimal flow into the gastroduodenal artery. At one point, there appeared to be retrograde flow into the gastroduodenal artery. After the vasospasm resolved, the flow to the common hepatic artery and gastroduodenal artery returned to baseline.  IMPRESSION: Irregularity of the common hepatic artery, gastroduodenal artery and proper hepatic artery. These findings are related to the surrounding tumor burden. There was extensive vasospasm in the common hepatic artery during this procedure but no evidence for acute bleeding or contrast extravasation. The gastroduodenal artery could never be successfully cannulated and, therefore, an empiric gastroduodenal embolization could not be performed.  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   Electronically Signed   By: Richarda Overlie M.D.   On: 02/18/2013 10:29   Ir Ivc Filter Plmt / S&i /img Guid/mod Sed  02/11/2013   CLINICAL DATA:  Metastatic pancreatic carcinoma. History of pulmonary embolism. GI bleeding, a relative contraindication to anticoagulation.  EXAM: INFERIOR VENACAVOGRAM  IVC FILTER PLACEMENT UNDER FLUOROSCOPY  TECHNIQUE: The procedure, risks (including but not limited to bleeding, infection, organ damage ), benefits, and alternatives were explained to the patient. Questions  regarding the procedure were encouraged and answered. The patient understands and consents to the procedure. Caval anatomy reviewed on previous CT abdomen. Patency of the right IJ vein was confirmed with ultrasound with image documentation. An appropriate skin site was determined. Skin site was marked, prepped with chlorhexidine, and draped using maximum barrier technique. The region was infiltrated locally with 1% lidocaine.  Intravenous Fentanyl and Versed were administered as conscious sedation during continuous cardiorespiratory monitoring by the radiology RN, with a total moderate sedation time of less than 30 minutes.  Under real-time ultrasound guidance, the right IJ vein was accessed with a 21 gauge micropuncture needle; the needle tip within the vein was confirmed with ultrasound image documentation. The needle was exchanged over a 018 guidewire for a transitional dilator, which allow advancement of the [facility] wire into the IVC. A long 6 French vascular sheath was placed for inferior venacavography. This demonstrated no caval thrombus. Renal vein inflows were evident.  The [facility] IVC filter was advanced through the sheath and successfully deployed under fluoroscopy at the L2-3 disc level. Followup cavagram demonstrates stable filter position and no evident complication. The sheath was removed and hemostasis achieved at the site. No immediate complication.  FLUOROSCOPY TIME:  36 seconds  IMPRESSION: 1. Normal IVC. No thrombus or significant anatomic variation. 2. Technically successful infrarenal IVC filter placement. This is a retrievable model.   Electronically Signed   By: Oley Balm M.D.   On: 02/11/2013 13:43   Ir US Guide Vasc Access Right  02/18/2013   INDICATION: History of metastatic pancreatic cancer, now with acute and recurrent upper GI bleeding from the duodenum based on recent endoscopy. Post failed attempted endoscopic duodenal stent placement and mesenteric arteriogram  EXAM: 1.   CELIAC ARTERIOGRAM  2.  COMMON HEPATIC ARTERIOGRAM (3RD ORDER).  3. SUB SELECTIVE GASTRODUODENAL ARTERIOGRAM (3RD ORDER) WITH  PARTICLE AND COIL EMBOLIZATION  4.  ULTRASOUND GUIDANCE FOR ARTERIAL ACCESS  MEDICATIONS: Nitroglycerin 100 mcg IA  TECHNIQUE: Informed written consent was obtained from the patient after a discussion of the risks, benefits and alternatives to treatment. Questions regarding the procedure were encouraged and answered. A timeout was performed prior to the initiation of the procedure.  The right groin was prepped and draped in the usual sterile fashion, and a sterile drape was applied covering the operative field. Maximum barrier sterile technique with sterile gowns and gloves were used for the procedure. A timeout was performed prior to the initiation of the procedure. Local anesthesia was provided with 1% lidocaine.  The right femoral head was marked fluoroscopically. Under ultrasound guidance, the right common femoral artery was accessed with a micropuncture kit after the overlying soft tissues were anesthetized with 1% lidocaine. An ultrasound image was saved for documentation purposes. The micropuncture sheath was exchanged for a 5 [place] vascular sheath over a Bentson wire. A closure arteriogram was performed through the side of the sheath confirming access within the right common femoral artery.  Over a Bentson wire, a Mickelson catheter was advanced to the level of the thoracic aorta where it was back bled and flushed. The catheter was then utilized to select the celiac artery and a celiac arteriogram was performed. 100 mcg of nitroglycerin was not administered intra-arterially through the celiac arterial vascular distribution.  With the use of a Fathom 14 microwire, a regular Renegade microcatheter was advanced into the common hepatic artery and a common hepatic arteriogram was performed. The microcatheter was then advanced into the distal aspect of the gastroduodenal artery and a sub  selective gastroduodenal arteriogram was performed.  Initially, the distal tributaries of the GDA were embolized with 700-900 micron Embospheres. Limited post particle embolization arteriogram was performed. Subsequently, the gastroduodenal artery was coil embolized to near its origin with a combination of overlapping 2 and 3 mm diameter interlock coils.  The micro catheter was retracted into the common hepatic artery and a repeat common hepatic arteriogram was performed. The micro catheter was removed and a post embolization celiac arteriogram was performed. All images were reviewed and the procedure was terminated. All wires, catheters and sheaths were removed from the patient. Hemostasis was achieved at the right groin access site with manual compression.  ANESTHESIA/SEDATION: Fentanyl 200 mcg IV; Versed 4 mg IV  60 minutes  CONTRAST:  50 cc Omnipaque 300  COMPARISON:  None.  FLUOROSCOPY TIME:  11 min, 42 seconds  COMPLICATIONS: None immediate  FINDINGS: Celiac arteriogram demonstrates persistent irregular narrowing of both the common and proper hepatic arteries extending preferentially involve the origin of the right hepatic artery.  The gastroduodenal artery remains patent though irregularly narrowed throughout its course.  The gastroduodenal artery was successfully cannulated and embolized with a combination of 700 to 900 micron Embospheres and overlapping interlock coils to near its origin. 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  IMPRESSION: 1. Successful prophylactic particle and coil embolization of the gastroduodenal artery secondary to recurrent and refractory upper GI (duodenal) bleed due to malignant invasion from patient's known pancreatic head carcinoma. 2. While the common and proper hepatic arteries remain irregularly narrowed throughout their course, I do not feel that these vessels are contributing to the patient's current hemodynamic  instability and as such were not embolized.  PLAN: Pending the patient's wishes, would recommend continued aggressive conservative management. 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   Electronically Signed   By: Simonne Come M.D.   On: 02/18/2013 13:58   Ir US Guide Vasc Access Right  02/18/2013   CLINICAL DATA:  57 year old with metastatic pancreatic cancer. Acute and recurrent upper GI bleeding. The patient has had previous duodenal bleeding based on endoscopy.  EXAM: VISCERAL ARTERIOGRAPHY ; SELECTIVE VISCERAL ARTERIOGRAPHY ; ULTRASOUND GUIDANCE FOR VASCULAR ACCESS  Physician: Rachelle Hora. Henn, MD  MEDICATIONS: Versed 5 mg, fentanyl 100 mcg. A radiology nurse monitored the patient for moderate sedation.  ANESTHESIA/SEDATION: Moderate sedation time: 1 hr and 45 min  CONTRAST:  85 mL Omnipaque 300  FLUOROSCOPY TIME:  29 min and 30 seconds  PROCEDURE: The procedure was explained to the patient. The risks and benefits of the procedure were discussed and the patient's questions were addressed. Informed consent was obtained from the patient. Patient was brought to the interventional suite. The right groin was prepped and draped in sterile fashion. Maximal barrier sterile technique was utilized including caps, mask, sterile gowns, sterile gloves, sterile drape, hand hygiene and skin antiseptic. The skin was anesthetized with 1% lidocaine. Using ultrasound guidance, a 21 gauge needle was directed into the right common femoral artery and a micropuncture dilator set was placed. A 5 French vascular sheath was  placed over a Bentson wire. The superior mesenteric artery was cannulated with a C2 catheter. Superior mesenteric arteriogram was performed. The C2 catheter was used to cannulate the celiac trunk. Celiac arteriography was performed. A Glidewire was advanced into the common hepatic artery and the C2 catheter was advanced into the proximal common hepatic artery. At this point, there appeared to be extensive spasm in the common hepatic artery. C2 catheter was pulled back to the celiac trunk origin and additional angiography was performed. Multiple attempts were made to cannulate the common hepatic artery with a Renegade micro catheter. Cannulating the common hepatic artery was very difficult with the micro catheter. As result, the C2 catheter was exchanged for a Sos catheter. Eventually, a glide micro wire was advanced into the common hepatic artery and proper hepatic artery. The Renegade catheter was advanced to the common hepatic artery and additional angiography was performed. Catheter was also advanced into the proper hepatic artery and additional angiography was performed. Multiple attempts were made to cannulate the gastroduodenal artery with a wire and catheter. These attempts at cannulating the gastroduodenal artery were unsuccessful. The micro catheter and 5 French catheter were removed. Final angiogram was performed through the right groin sheath. The groin sheath was removed with an Exoseal closure device. There was groin hemostasis at the end of the procedure.  COMPLICATIONS: None  FINDINGS: Superior mesenteric arteriogram: Patient has a metallic biliary stent and IVC filter. Patient has a large replaced right hepatic artery that originates near the origin of the superior mesenteric artery. There are small pancreatic-duodenal branches but no evidence for a vascular malformation or contrast extravasation. The delayed images demonstrate patency of the superior mesenteric veins but there is marked attenuation  and narrowing of the portal confluence and main portal vein. The intrahepatic portal venous system appears to be patent.  Celiac arteriography: The splenic artery is large and patent. There  is a prominent left gastric artery with filling of the right gastric artery. There is mild narrowing and irregularity at the common hepatic artery and proper hepatic artery. There appears to be irregularity of the gastroduodenal artery. There is narrowing involving the origin of the right hepatic artery. There is no evidence for acute extravasation. After a wire was advanced into the common hepatic artery, there was marked narrowing of the common hepatic artery suggestive for vasospasm and minimal flow into the gastroduodenal artery. At one point, there appeared to be retrograde flow into the gastroduodenal artery. After the vasospasm resolved, the flow to the common hepatic artery and gastroduodenal artery returned to baseline.  IMPRESSION: Irregularity of the common hepatic artery, gastroduodenal artery and proper hepatic artery. These findings are related to the surrounding tumor burden. There was extensive vasospasm in the common hepatic artery during this procedure but no evidence for acute bleeding or contrast extravasation. The gastroduodenal artery could never be successfully cannulated and, therefore, an empiric gastroduodenal embolization could not be performed.  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   Electronically Signed   By: Richarda Overlie M.D.   On: 02/18/2013 10:29   Dg C-arm 1-60 Min-no Report  02/16/2013   CLINICAL DATA: stent placement   C-ARM 1-60 MINUTES  Fluoroscopy was utilized by the requesting physician.  No radiographic  interpretation.    Ir Rebekah [place] [facility] [employer] Guide Roadmapping  02/18/2013   INDICATION: History of metastatic pancreatic cancer, now with acute and recurrent upper GI bleeding from the duodenum based on recent endoscopy. Post  failed attempted endoscopic duodenal stent placement and mesenteric arteriogram  EXAM: 1.  CELIAC ARTERIOGRAM  2.  COMMON HEPATIC ARTERIOGRAM (3RD ORDER).  3. SUB SELECTIVE GASTRODUODENAL ARTERIOGRAM (3RD ORDER) WITH PARTICLE AND COIL EMBOLIZATION  4.  ULTRASOUND GUIDANCE FOR ARTERIAL ACCESS  MEDICATIONS: Nitroglycerin 100 mcg IA  TECHNIQUE: Informed written consent was obtained from the patient after a discussion of the risks, benefits and alternatives to treatment. Questions regarding the procedure were encouraged and answered. A timeout was performed prior to the initiation of the procedure.  The right groin was prepped and draped in the usual sterile fashion, and a sterile drape was applied covering the operative field. Maximum barrier sterile technique with sterile gowns and gloves were used for the procedure. A timeout was performed prior to the initiation of the procedure. Local anesthesia was provided with 1% lidocaine.  The right femoral head was marked fluoroscopically. Under ultrasound guidance, the right common femoral artery was accessed with a micropuncture kit after the overlying soft tissues were anesthetized with 1% lidocaine. An ultrasound image was saved for documentation purposes. The micropuncture sheath was exchanged for a 5 [place] vascular sheath over a Bentson wire. A closure arteriogram was performed through the side of the sheath confirming access within the right common femoral artery.  Over a Bentson wire, a Mickelson catheter was advanced to the level of the thoracic aorta where it was back bled and flushed. The catheter was then utilized to select the celiac artery and a celiac arteriogram was performed. 100 mcg of nitroglycerin was not administered intra-arterially through the celiac arterial vascular distribution.  With the use of a Fathom 14 microwire, a regular Renegade microcatheter was advanced into the common hepatic artery and a common hepatic arteriogram was performed. The  microcatheter was then advanced into the distal aspect of the gastroduodenal artery and a sub selective gastroduodenal arteriogram  was performed.  Initially, the distal tributaries of the GDA were embolized with 700-900 micron Embospheres. Limited post particle embolization arteriogram was performed. Subsequently, the gastroduodenal artery was coil embolized to near its origin with a combination of overlapping 2 and 3 mm diameter interlock coils.  The micro catheter was retracted into the common hepatic artery and a repeat common hepatic arteriogram was performed. The micro catheter was removed and a post embolization celiac arteriogram was performed. All images were reviewed and the procedure was terminated. All wires, catheters and sheaths were removed from the patient. Hemostasis was achieved at the right groin access site with manual compression.  ANESTHESIA/SEDATION: Fentanyl 200 mcg IV; Versed 4 mg IV  60 minutes  CONTRAST:  50 cc Omnipaque 300  COMPARISON:  None.  FLUOROSCOPY TIME:  11 min, 42 seconds  COMPLICATIONS: None immediate  FINDINGS: Celiac arteriogram demonstrates persistent irregular narrowing of both the common and proper hepatic arteries extending preferentially involve the origin of the right hepatic artery.  The gastroduodenal artery remains patent though irregularly narrowed throughout its course.  The gastroduodenal artery was successfully cannulated and embolized with a combination of 700 to 900 micron Embospheres and overlapping interlock coils to near its origin. 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  IMPRESSION: 1. Successful prophylactic particle and coil embolization of the gastroduodenal artery secondary to recurrent and refractory upper GI (duodenal) bleed due to malignant invasion from patient's known pancreatic head carcinoma. 2. While the common and proper hepatic arteries remain irregularly narrowed throughout their course, I  do not feel that these vessels are contributing to the patient's current hemodynamic instability and as such were not embolized.  PLAN: Pending the patient's wishes, would recommend continued aggressive conservative management. 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   Electronically Signed   By: Simonne Come M.D.   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 CT angiography is performed to assess vasculature prior to potential arteriography and transcatheter embolization to treat persistent bleeding.  EXAM: CT ANGIOGRAPHY ABDOMEN AND PELVIS WITH CONTRAST AND WITHOUT CONTRAST  TECHNIQUE: Multidetector CT imaging of the abdomen and pelvis was performed using the standard protocol during bolus administration of intravenous contrast. Multiplanar reconstructed images including MIPs were obtained and reviewed to evaluate the vascular anatomy.  CONTRAST:  OMNIPAQUE IOHEXOL 350 MG/ML SOLN  COMPARISON:  12/23/2012  FINDINGS: There is significant enlargement of the mass involving the head of the pancreas since the prior CT in October. Dimensions of the mass are now approximately 3.7 x 5.6 x 4.5 cm. The mass visibly encases the pre-existing biliary stent and also extends further to encase the gastroduodenal artery, portal venous confluence, hepatic artery and a segment of the superior mesenteric vein. 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 Portal flow is present in the right lobe.  Significant progression of metastatic disease  in the liver is also identified in both left and right lobes. Index lesion in the inferior right lobe shows enlargement with dimensions of 2.5 x 3.5 cm (1.6 x 2.1 cm previously). The largest left lobe lesion has enlarged from a maximum diameter of 2.4 cm previously to estimated current maximum diameter of 4.0 cm. All previously identified metastatic lesions have increased significantly in size. There are at least 4 new metastatic lesions identified in the liver. There is no evidence of associated biliary obstruction with air remaining a biliary tree. The common bile duct stent lumen does not appear to be compromised.  There is new ascites with mild to moderate overall volume of ascites present as well as nodularity of the mesenteric and omentum suspicious for carcinomatosis. No bowel obstruction or perforation is identified.  Arterial vasculature evaluated by CTA shows no evidence of significant occlusive disease. The aorta and major visceral branches are normally patent. At the level of the celiac trunk, the common hepatic artery is narrowed and encased by tumor but remains open with flow noted in both right and left hepatic arteries. The gastroduodenal artery is open but encased by tumor. Small distal GDA branches are present supplying the pancreatic mass. There also appears to be pancreaticoduodenal supply from the SMA at the level of the pancreatic mass.  The superior mesenteric artery abuts tumor but is not visibly narrowed. Focal nonocclusive thrombus or direct tumor invasion is identified in the superior aspect of the superior mesenteric vein causing luminal stenosis of approximately 60%. The splenic vein remains open.  Bilateral renal artery show normal patency. The inferior mesenteric vein is open. Bilateral iliac  and common femoral arteries show normal patency.  Appearance of the gallbladder and kidneys are unremarkable. The spleen shows a new focal wedge-shaped infarct in its posterior and inferior aspect. The rest of the spleen shows normal perfusion. There is a stable left adrenal nodule. Bony structures show degenerative changes of the spine without evidence of focal lesions.  Review of the MIP images confirms the above findings.  IMPRESSION: 1. Significant progression of metastatic pancreatic carcinoma with enlargement of the mass at the level of the head of the pancreas and progression of hepatic metastatic disease. 2. 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 Tumor also encases and narrows the common hepatic artery, right and left hepatic arteries and gastroduodenal artery. There also is tumor involvement at the level of the superior mesenteric vein. 3. 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   Electronically Signed   By: Irish Lack M.D.   On: 02/11/2013 10:34         Subjective: No complaints. States this is the best he has felt since he has been here. Had a BM earlier today without evidence of blood.  Objective: Filed Vitals:   02/21/13 1520 02/21/13 1620 02/21/13 2118 02/22/13 0453  BP: 136/82 140/85 129/80 128/79  Pulse: 92 91 94 92  Temp: 98.3 F (36.8 C) 98.5 F (36.9 C) 98.6 F (37 C) 98.3 F (36.8 C)  TempSrc: Oral Oral Oral Oral  Resp: 22 20 20 20   Height:      Weight:      SpO2:   98% 97%    Intake/Output Summary (Last 24 hours) at 02/22/13 0951 Last data filed at 02/22/13 0601  Gross per 24 hour  Intake 1715.5 ml  Output      0  ml  Net 1715.5 ml   Weight change:  Exam:   General:  Pt is alert, follows commands appropriately, not in acute distress  HEENT: No icterus, No thrush,  [place]/AT  Cardiovascular: RRR, S1/S2, no rubs, no  gallops  Respiratory: CTA bilaterally, no wheezing, no crackles, no rhonchi  Abdomen: Soft/+BS, non tender, non distended, no guarding  Extremities: 1+ edema, No lymphangitis, No petechiae, No rashes, no synovitis  Data Reviewed: Basic Metabolic Panel:  Recent Labs Lab 02/15/13 1036 02/17/13 1903 02/18/13 0130  NA 133* 134* 139  K 3.8 3.4* 3.6  CL 104 106 110  CO2 18* 21 22  GLUCOSE 135* 138* 106*  BUN 14 13 12   CREATININE 0.89 0.74 0.77  CALCIUM 8.1* 8.1* 8.6   Liver Function Tests:  Recent Labs Lab 02/15/13 1036  AST 25  ALT 21  ALKPHOS 81  BILITOT 0.4  PROT 5.0*  ALBUMIN 2.2*   No results found for this basename: LIPASE, AMYLASE,  in the last 168 hours No results found for this basename: AMMONIA,  in the last 168 hours CBC:  Recent Labs Lab 02/19/13 0300 02/19/13 0800 02/20/13 0600 02/21/13 0625 02/21/13 2030 02/22/13 0600  WBC 8.7 8.6 7.3 7.2  --  8.1  HGB 9.2* 9.5* 9.0* 8.9* 9.2* 8.7*  HCT 27.0* 27.6* 26.6* 26.4* 27.3* 26.0*  MCV 87.9 87.9 88.4 88.3  --  89.3  PLT 85* 84* 84* 63*  --  85*   Cardiac Enzymes: No results found for this basename: CKTOTAL, CKMB, CKMBINDEX, TROPONINI,  in the last 168 hours BNP: No components found with this basename: POCBNP,  CBG: No results found for this basename: GLUCAP,  in the last 168 hours  Recent Results (from the past 240 hour(s))  CLOSTRIDIUM DIFFICILE BY PCR     Status: None   Collection Time    02/13/13  1:10 PM      Result Value Range Status   C difficile by pcr NEGATIVE  NEGATIVE Final   Comment: Performed at [facility]     Scheduled Meds: . acetaminophen  650 mg Oral Once  . folic acid  2 mg Oral Daily  . furosemide  10 mg Intravenous Once  . lipase/protease/amylase  2 capsule Oral TID AC & HS  . pantoprazole  80 mg Oral Daily  . sodium chloride  3 mL Intravenous Q12H  . sucralfate  2 g Oral Custom   Continuous Infusions: . sodium chloride 30 mL/hr at 02/22/13 0601      Chaya Jan, MD  [facility] Pager [phone]  If 7PM-7AM, please contact night-coverage [email or web address] Password [facility] 02/22/2013, 9:51 AM   LOS: 7 days

## 2013-02-22 NOTE — Progress Notes (Signed)
Patient claimed he had about 3 bowel movements today , still loose and some blood .none was shown to the RN.

## 2013-02-23 ENCOUNTER — Encounter (HOSPITAL_COMMUNITY): Payer: Self-pay | Admitting: Radiology

## 2013-02-23 ENCOUNTER — Ambulatory Visit
Admit: 2013-02-23 | Discharge: 2013-02-23 | Disposition: A | Discharge status: Home / Self Care | Payer: BC Managed Care – PPO | Attending: Radiation Oncology | Admitting: Radiation Oncology

## 2013-02-23 ENCOUNTER — Ambulatory Visit (HOSPITAL_COMMUNITY): Payer: BC Managed Care – PPO

## 2013-02-23 LAB — TYPE AND SCREEN
ABO/RH(D): A POS
Antibody Screen: NEGATIVE
Unit division: 0

## 2013-02-23 LAB — CBC
MCHC: 34.2 g/dL (ref 30.0–36.0)
Platelets: 73 10*3/uL — ABNORMAL LOW (ref 150–400)
RDW: 16.5 % — ABNORMAL HIGH (ref 11.5–15.5)
WBC: 9.3 10*3/uL (ref 4.0–10.5)

## 2013-02-23 MED ORDER — IOHEXOL 300 MG/ML  SOLN: 25.0000 mL | INTRAMUSCULAR | Status: AC

## 2013-02-23 MED ORDER — IOHEXOL 350 MG/ML SOLN
100.0000 mL | Freq: Once | INTRAVENOUS | Status: AC | PRN
  Administered 2013-02-23: 100 mL via INTRAVENOUS

## 2013-02-23 MED ORDER — ENSURE COMPLETE PO LIQD
237.0000 mL | Freq: Two times a day (BID) | ORAL | Status: DC | PRN
  Administered 2013-02-23 – 2013-02-24 (×3): 237 mL via ORAL

## 2013-02-23 NOTE — Progress Notes (Signed)
NUTRITION FOLLOW UP  Intervention:   Provide Ensure Complete BID PRN Encourage PO intake  Nutrition Dx:   Inadequate oral intake related to inability to eat as evidenced by NPO; ongoing- diet advanced but, poor PO today  Goal:   Advance diet as tolerated to regular diet; met  New Goal: Pt to meet >/= 90% of their estimated nutrition needs   Monitor:   Weight; 18 lbs above usual weight Labs; low hemoglobin, low potassium Diet advancement; advanced to regular 12/19  Assessment:   Pt reports that his appetite has been good and he has been eating >75% of 3 meals daily. Pt reports today his appetite is decreased due to bloating and he requests Ensure supplements until bloating resolves.  Per nursing notes pt has been eating 75-100% of most meals; no PO intake documented today.   Height: Ht Readings from Last 1 Encounters:  02/19/13 6\' 1"  (1.854 m)    Weight Status:   Wt Readings from Last 1 Encounters:  02/19/13 218 lb 6.4 oz (99.066 kg)    Re-estimated needs:  Kcal: 2300-2500  Protein: 110-130g  Fluid: 2.3-2.5L/day   Skin: incision on right anterior lower neck  Diet Order: General   Intake/Output Summary (Last 24 hours) at 02/23/13 1200 Last data filed at 02/23/13 0445  Gross per 24 hour  Intake 1734.5 ml  Output      0 ml  Net 1734.5 ml    Last BM: 12/22   Labs:   Recent Labs Lab 02/17/13 1903 02/18/13 0130 02/22/13 0925  NA 134* 139 137  K 3.4* 3.6 3.2*  CL 106 110 108  CO2 21 22 20   BUN 13 12 9   CREATININE 0.74 0.77 0.83  CALCIUM 8.1* 8.6 8.4  GLUCOSE 138* 106* 175*    CBG (last 3)  No results found for this basename: GLUCAP,  in the last 72 hours  Scheduled Meds: . folic acid  2 mg Oral Daily  . lipase/protease/amylase  2 capsule Oral TID AC & HS  . pantoprazole  80 mg Oral Daily  . sodium chloride  3 mL Intravenous Q12H  . sucralfate  2 g Oral Custom    Continuous Infusions: . sodium chloride 30 mL/hr at 02/22/13 0601    Ian Malkin RD, LDN Inpatient Clinical Dietitian Pager: [phone] After Hours Pager: [phone]

## 2013-02-23 NOTE — Progress Notes (Signed)
PATIENT INSISTED  THAT IV BE TURNED OFF. PAC FLUSHED/CAPPED. Contrina Orona,RN

## 2013-02-23 NOTE — Progress Notes (Signed)
Austin Escobar seems to be quite bloated today. I think we need to do a CT scan of his abdomen to make sure there is nothing going on there that would cause this. I would worry about the possibility of ascites. Also, bleeding could be a possibility.  Has not noted much in the way of blood per rectum. He had 3 stools yesterday.  His appetite is doing okay. He's had no nausea vomiting.  He was transfused with 2 units of blood yesterday.  He is out of bed.  He is continuing radiation therapy. He is doing well with this.  His vital signs are all stable. Blood pressure is 121/79. Temperature is 98.6. His lungs are clear. Cardiac exam regular rate and rhythm with no murmurs rubs or bruits. Abdomen is slightly distended. Bowel sounds are decreased but present. There is no guarding. Is hard to tell if there is a fluid wave. Extremities shows some trace edema in his legs.  Laboratory studies shows a white count 9.3 hemoglobin 9.5 platelet count 73,000.  We will see what a CT scan shows. You'll continue radiation therapy. He does not need any transfusions today.  Hopefully, he will be able to go home soon if his counts stabilize.  Max Fickle 2:5-14

## 2013-02-23 NOTE — Progress Notes (Signed)
[facility] PROGRESS NOTE  Austin Escobar GNF:[id number] DOB: 05-Sep-1955 DOA: 02/15/2013 PCP: [name], DO  Interim history  57 y.o. 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 Due to poor prognosis, patient was referred for possible trial at [facility] by Dr. [name] (oncologist), although not yet accepted. He was DNR/DNI. Readmit on 12/15 and wanted to change to FULL CODE. Recurrent GIB beginning on the evening of 12/14, with blood mixed with stool and then progressed with 3-4 episodes of frank bloody clots. He had subsequent dizziness. Hgb on admission was  7.3 (was 9.0 on discharge) , requiring 2 units of blood. He also received 2 units of FFP 02/16/2013 due to INR 1.7. He has had a bowel movement this morning without any visible blood. Dr. [name] attempted duodenal stent to assist with tamponade of bleed, but this was unsuccessful due to delivery system being too short.  Assessment/Plan: Acute blood loss anemia  -2 additional units PRBCs 12/22. -Has received 9 units PRBCs during this admission; 2units FFP; 1 unit of platelets. -Underwent embolization 12/18 of the GDA by IR 12/18. -Has had some diarrhea overnight and noted blood in stool for the first time in days. -Hb 8.7 today; Dr. [name] has ordered 2 units of PRBCs to be transfused. -appreciate GI and IR followup  -Avoid NSAIDs  -Hemoglobin has remained fairly stable. Patient is hemodynamically stable  -Full liquid diet for now  -origin of bleed likely from duodenal bulb where there is tumor invasion  -Had some abdominal bloating and Dr. [name] has ordered a CT abdomen; results pending.  GI bleed  -As above.  Coagulopathy  -Received 2 units FFP 02/16/2013 when his INR was 1.7  -vitamin K given 12/18  Thrombocytopenia -Received 1 unit of platelets on 12/21.  Esophageal varices  -No bleeding noted from previous endoscopy  -Continue Protonix and Carafate   Saddle pulmonary embolus  -12/23/2012  -Status post IVC filter in December 2014  -Currently stable without any respiratory distress or hypoxemia   Atrial fibrillation  -Status post ablation 05/25/2012  -Currently in sinus rhythm  -Not a candidate for anticoagulation   Metastatic pancreatic adenocarcinoma with duodenal invasion  -Appreciate oncology followup  -continue radiation therapy. -Dr. [name] is following   -LIMITED CODE--NO CPR, NO SHOCK, NO INTUBATION, YES VASOPRESSOR MEDS  HTN  -  Metoprolol tartrate is on hold due to soft blood pressures   Family Communication: Patient only Disposition Plan: Anticipate  DC home once medically ready.     Procedures/Studies: Ir Angiogram Visceral Selective  02/18/2013   INDICATION: History of metastatic pancreatic cancer, now with acute and recurrent upper GI bleeding from the duodenum based on recent endoscopy. Post failed attempted endoscopic duodenal stent placement and mesenteric arteriogram  EXAM: 1.  CELIAC ARTERIOGRAM  2.  COMMON HEPATIC ARTERIOGRAM (3RD ORDER).  3. SUB SELECTIVE GASTRODUODENAL ARTERIOGRAM (3RD ORDER) WITH PARTICLE AND COIL EMBOLIZATION  4.  ULTRASOUND GUIDANCE FOR ARTERIAL ACCESS  MEDICATIONS: Nitroglycerin 100 mcg IA  TECHNIQUE: Informed written consent was obtained from the patient after a discussion of the risks, benefits and alternatives to treatment. Questions regarding the procedure were encouraged and answered. A timeout was performed prior to the initiation of the procedure.  The right groin was prepped and draped in the usual sterile fashion, and a sterile drape was applied covering the operative field. Maximum barrier sterile technique with sterile gowns and gloves were used for the procedure. A timeout was performed prior to the initiation of the procedure. Local anesthesia was provided with 1% lidocaine.  The right femoral head was marked fluoroscopically. Under ultrasound guidance, the right common femoral artery was accessed with a micropuncture kit after the overlying soft tissues were anesthetized with 1% lidocaine. An ultrasound image was saved for documentation purposes. The micropuncture sheath was exchanged for a 5 [place] vascular sheath over a Bentson wire. A closure arteriogram was performed through the side of the sheath confirming access within the right common femoral artery.  Over a Bentson wire, a Mickelson catheter was advanced to the level of the thoracic aorta where it was back bled and flushed. The catheter was then utilized to select the celiac artery and a celiac arteriogram was performed. 100 mcg of nitroglycerin was  not administered intra-arterially through the celiac arterial vascular distribution.  With the use of a Fathom 14 microwire, a regular Renegade microcatheter was advanced into the common hepatic artery and a common hepatic arteriogram was performed. The microcatheter was then advanced into the distal aspect of the gastroduodenal artery and a sub selective gastroduodenal arteriogram was performed.  Initially, the distal tributaries of the GDA were embolized with 700-900 micron Embospheres. Limited post particle embolization arteriogram was performed. Subsequently, the gastroduodenal artery was coil embolized to near its origin with a combination of overlapping 2 and 3 mm diameter interlock coils.  The micro catheter was retracted into the common hepatic artery and a repeat common hepatic arteriogram was performed. The micro catheter was removed and a post embolization celiac arteriogram was performed. All images were reviewed and the procedure was terminated. All wires, catheters and sheaths were removed from the patient. Hemostasis was achieved at the right groin access site with manual compression.  ANESTHESIA/SEDATION: Fentanyl 200 mcg IV; Versed 4 mg IV  60 minutes  CONTRAST:  50 cc Omnipaque 300  COMPARISON:  None.  FLUOROSCOPY TIME:  11 min, 42 seconds  COMPLICATIONS: None immediate  FINDINGS: Celiac arteriogram demonstrates persistent irregular narrowing of both the common and proper hepatic arteries extending preferentially involve the origin of the right hepatic artery.  The gastroduodenal artery remains patent though irregularly narrowed throughout its course.  The gastroduodenal artery was successfully cannulated and embolized with a combination of 700 to 900 micron Embospheres and overlapping interlock coils to near its origin. 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  IMPRESSION: 1. Successful prophylactic particle and coil embolization of the  gastroduodenal artery secondary to recurrent and refractory upper GI (duodenal) bleed due to malignant invasion from patient's known pancreatic head carcinoma. 2. While the common and proper hepatic arteries remain irregularly narrowed throughout their course, I do not feel that these vessels are contributing to the patient's current hemodynamic instability and as such were not embolized.  PLAN: Pending the patient's wishes, would recommend continued aggressive conservative management. 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   Electronically Signed   By: Simonne Come M.D.   On: 02/18/2013 13:58   Ir Angiogram Visceral Selective  02/18/2013   CLINICAL DATA:  57 year old with metastatic pancreatic cancer. Acute and recurrent upper GI bleeding. The patient has had previous duodenal bleeding based on endoscopy.  EXAM: VISCERAL ARTERIOGRAPHY ; SELECTIVE VISCERAL ARTERIOGRAPHY ; ULTRASOUND GUIDANCE FOR VASCULAR ACCESS  Physician: Rachelle Hora. Henn, MD  MEDICATIONS: Versed 5 mg, fentanyl 100 mcg. A radiology nurse monitored the patient for moderate sedation.  ANESTHESIA/SEDATION: Moderate sedation time: 1 hr and 45 min  CONTRAST:  85 mL Omnipaque 300  FLUOROSCOPY TIME:  29 min and 30 seconds  PROCEDURE: The procedure was explained to the patient. The risks and benefits of the procedure were discussed and the patient's questions were addressed. Informed consent was obtained from the patient. Patient was brought to the interventional suite. The right groin was prepped and draped in sterile fashion. Maximal barrier  sterile technique was utilized including caps, mask, sterile gowns, sterile gloves, sterile drape, hand hygiene and skin antiseptic. The skin was anesthetized with 1% lidocaine. Using ultrasound guidance, a 21 gauge needle was directed into the right common femoral artery and a micropuncture dilator set was placed. A 5 French vascular sheath was placed over a Bentson wire. The superior mesenteric artery was cannulated with a C2 catheter. Superior mesenteric arteriogram was performed. The C2 catheter was used to cannulate the celiac trunk. Celiac arteriography was performed. A Glidewire was advanced into the common hepatic artery and the C2 catheter was advanced into the proximal common hepatic artery. At this point, there appeared to be extensive spasm in the common hepatic artery. C2 catheter was pulled back to the celiac trunk origin and additional angiography was performed. Multiple attempts were made to cannulate the common hepatic artery with a Renegade micro catheter. Cannulating the common hepatic artery was very difficult with the micro catheter. As result, the C2 catheter was exchanged for a Sos catheter. Eventually, a glide micro wire was advanced into the common hepatic artery and proper hepatic artery. The Renegade catheter was advanced to the common hepatic artery and additional angiography was performed. Catheter was also advanced into the proper hepatic artery and additional angiography was performed. Multiple attempts were made to cannulate the gastroduodenal artery with a wire and catheter. These attempts at cannulating the gastroduodenal artery were unsuccessful. The micro catheter and 5 French catheter were removed. Final angiogram was performed through the right groin sheath. The groin sheath was removed with an Exoseal closure device. There was groin hemostasis at the end of the procedure.  COMPLICATIONS: None  FINDINGS: Superior  mesenteric arteriogram: Patient has a metallic biliary stent and  IVC filter. Patient has a large replaced right hepatic artery that originates near the origin of the superior mesenteric artery. There are small pancreatic-duodenal branches but no evidence for a vascular malformation or contrast extravasation. The delayed images demonstrate patency of the superior mesenteric veins but there is marked attenuation and narrowing of the portal confluence and main portal vein. The intrahepatic portal venous system appears to be patent.  Celiac arteriography: The splenic artery is large and patent. There is a prominent left gastric artery with filling of the right gastric artery. There is mild narrowing and irregularity at the common hepatic artery and proper hepatic artery. There appears to be irregularity of the gastroduodenal artery. There is narrowing involving the origin of the right hepatic artery. There is no evidence for acute extravasation. After a wire was advanced into the common hepatic artery, there was marked narrowing of the common hepatic artery suggestive for vasospasm and minimal flow into the gastroduodenal artery. At one point, there appeared to be retrograde flow into the gastroduodenal artery. After the vasospasm resolved, the flow to the common hepatic artery and gastroduodenal artery returned to baseline.  IMPRESSION: Irregularity of the common hepatic artery, gastroduodenal artery and proper hepatic artery. These findings are related to the surrounding tumor burden. There was extensive vasospasm in the common hepatic artery during this procedure but no evidence for acute bleeding or contrast extravasation. The gastroduodenal artery could never be successfully cannulated and, therefore, an empiric gastroduodenal embolization could not be performed.  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   Electronically Signed   By: Richarda Overlie M.D.   On: 02/18/2013 10:29   Ir Angiogram Visceral Selective  02/18/2013   CLINICAL DATA:   57 year old with metastatic pancreatic cancer. Acute and recurrent upper GI bleeding. The patient has had previous duodenal bleeding based on endoscopy.  EXAM: VISCERAL ARTERIOGRAPHY ; SELECTIVE VISCERAL ARTERIOGRAPHY ; ULTRASOUND GUIDANCE FOR VASCULAR ACCESS  Physician: Rachelle Hora. Henn, MD  MEDICATIONS: Versed 5 mg, fentanyl 100 mcg. A radiology nurse monitored the patient for moderate sedation.  ANESTHESIA/SEDATION: Moderate sedation time: 1 hr and 45 min  CONTRAST:  85 mL Omnipaque 300  FLUOROSCOPY TIME:  29 min and 30 seconds  PROCEDURE: The procedure was explained to the patient. The risks and benefits of the procedure were discussed and the patient's questions were addressed. Informed consent was obtained from the patient. Patient was brought to the interventional suite. The right groin was prepped and draped in sterile fashion. Maximal barrier sterile technique was utilized including caps, mask, sterile gowns, sterile gloves, sterile drape, hand hygiene and skin antiseptic. The skin was anesthetized with 1% lidocaine. Using ultrasound guidance, a 21 gauge needle was directed into the right common femoral artery and a micropuncture dilator set was placed. A 5 French vascular sheath was placed over a Bentson wire. The superior mesenteric artery was cannulated with a C2 catheter. Superior mesenteric arteriogram was performed. The C2 catheter was used to cannulate the celiac trunk. Celiac arteriography was performed. A Glidewire was advanced into the common hepatic artery and the C2 catheter was advanced into the proximal common hepatic artery. At this point, there appeared to be extensive spasm in the common hepatic artery. C2 catheter was pulled back to the celiac trunk origin and additional angiography was performed. Multiple attempts were made to cannulate the common hepatic artery with a Renegade micro catheter. Cannulating the common hepatic artery  was very difficult with the micro catheter. As result, the C2  catheter was exchanged for a Sos catheter. Eventually, a glide micro wire was advanced into the common hepatic artery and proper hepatic artery. The Renegade catheter was advanced to the common hepatic artery and additional angiography was performed. Catheter was also advanced into the proper hepatic artery and additional angiography was performed. Multiple attempts were made to cannulate the gastroduodenal artery with a wire and catheter. These attempts at cannulating the gastroduodenal artery were unsuccessful. The micro catheter and 5 French catheter were removed. Final angiogram was performed through the right groin sheath. The groin sheath was removed with an Exoseal closure device. There was groin hemostasis at the end of the procedure.  COMPLICATIONS: None  FINDINGS: Superior mesenteric arteriogram: Patient has a metallic biliary stent and IVC filter. Patient has a large replaced right hepatic artery that originates near the origin of the superior mesenteric artery. There are small pancreatic-duodenal branches but no evidence for a vascular malformation or contrast extravasation. The delayed images demonstrate patency of the superior mesenteric veins but there is marked attenuation and narrowing of the portal confluence and main portal vein. The intrahepatic portal venous system appears to be patent.  Celiac arteriography: The splenic artery is large and patent. There is a prominent left gastric artery with filling of the right gastric artery. There is mild narrowing and irregularity at the common hepatic artery and proper hepatic artery. There appears to be irregularity of the gastroduodenal artery. There is narrowing involving the origin of the right hepatic artery. There is no evidence for acute extravasation. After a wire was advanced into the common hepatic artery, there was marked narrowing of the common hepatic artery suggestive for vasospasm and minimal flow into the gastroduodenal artery. At one  point, there appeared to be retrograde flow into the gastroduodenal artery. After the vasospasm resolved, the flow to the common hepatic artery and gastroduodenal artery returned to baseline.  IMPRESSION: Irregularity of the common hepatic artery, gastroduodenal artery and proper hepatic artery. These findings are related to the surrounding tumor burden. There was extensive vasospasm in the common hepatic artery during this procedure but no evidence for acute bleeding or contrast extravasation. The gastroduodenal artery could never be successfully cannulated and, therefore, an empiric gastroduodenal embolization could not be performed.  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   Electronically Signed   By: Richarda Overlie M.D.   On: 02/18/2013 10:29   Ir Angiogram Selective Each Additional Vessel  02/18/2013   INDICATION: History of metastatic pancreatic cancer, now with acute and recurrent upper GI bleeding from the duodenum based on recent endoscopy. Post failed attempted endoscopic duodenal stent placement and mesenteric arteriogram  EXAM: 1.  CELIAC ARTERIOGRAM  2.  COMMON HEPATIC ARTERIOGRAM (3RD ORDER).  3. SUB SELECTIVE GASTRODUODENAL ARTERIOGRAM (3RD ORDER) WITH PARTICLE AND COIL EMBOLIZATION  4.  ULTRASOUND GUIDANCE FOR ARTERIAL ACCESS  MEDICATIONS: Nitroglycerin 100 mcg IA  TECHNIQUE: Informed written consent was obtained from the patient after a discussion of the risks, benefits and alternatives to treatment. Questions regarding the procedure were encouraged and answered. A timeout was performed prior to the initiation of the procedure.  The right groin was prepped and draped in the usual sterile fashion, and a sterile drape was applied covering the operative field. Maximum barrier sterile technique with sterile gowns and gloves were used for the procedure. A timeout was performed prior to the initiation of the procedure. Local anesthesia  was provided with 1% lidocaine.   The right femoral head was marked fluoroscopically. Under ultrasound guidance, the right common femoral artery was accessed with a micropuncture kit after the overlying soft tissues were anesthetized with 1% lidocaine. An ultrasound image was saved for documentation purposes. The micropuncture sheath was exchanged for a 5 [place] vascular sheath over a Bentson wire. A closure arteriogram was performed through the side of the sheath confirming access within the right common femoral artery.  Over a Bentson wire, a Mickelson catheter was advanced to the level of the thoracic aorta where it was back bled and flushed. The catheter was then utilized to select the celiac artery and a celiac arteriogram was performed. 100 mcg of nitroglycerin was not administered intra-arterially through the celiac arterial vascular distribution.  With the use of a Fathom 14 microwire, a regular Renegade microcatheter was advanced into the common hepatic artery and a common hepatic arteriogram was performed. The microcatheter was then advanced into the distal aspect of the gastroduodenal artery and a sub selective gastroduodenal arteriogram was performed.  Initially, the distal tributaries of the GDA were embolized with 700-900 micron Embospheres. Limited post particle embolization arteriogram was performed. Subsequently, the gastroduodenal artery was coil embolized to near its origin with a combination of overlapping 2 and 3 mm diameter interlock coils.  The micro catheter was retracted into the common hepatic artery and a repeat common hepatic arteriogram was performed. The micro catheter was removed and a post embolization celiac arteriogram was performed. All images were reviewed and the procedure was terminated. All wires, catheters and sheaths were removed from the patient. Hemostasis was achieved at the right groin access site with manual compression.  ANESTHESIA/SEDATION: Fentanyl 200 mcg IV; Versed 4 mg IV  60 minutes  CONTRAST:   50 cc Omnipaque 300  COMPARISON:  None.  FLUOROSCOPY TIME:  11 min, 42 seconds  COMPLICATIONS: None immediate  FINDINGS: Celiac arteriogram demonstrates persistent irregular narrowing of both the common and proper hepatic arteries extending preferentially involve the origin of the right hepatic artery.  The gastroduodenal artery remains patent though irregularly narrowed throughout its course.  The gastroduodenal artery was successfully cannulated and embolized with a combination of 700 to 900 micron Embospheres and overlapping interlock coils to near its origin. 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  IMPRESSION: 1. Successful prophylactic particle and coil embolization of the gastroduodenal artery secondary to recurrent and refractory upper GI (duodenal) bleed due to malignant invasion from patient's known pancreatic head carcinoma. 2. While the common and proper hepatic arteries remain irregularly narrowed throughout their course, I do not feel that these vessels are contributing to the patient's current hemodynamic instability and as such were not embolized.  PLAN: Pending the patient's wishes, would recommend continued aggressive conservative management. 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   Electronically Signed   By: Simonne Come M.D.   On: 02/18/2013 13:58   Ir Angiogram Selective Each Additional Vessel  02/18/2013   CLINICAL DATA:  57 year old with metastatic pancreatic cancer. Acute and recurrent upper GI bleeding. The patient has had  previous duodenal bleeding based  on endoscopy.  EXAM: VISCERAL ARTERIOGRAPHY ; SELECTIVE VISCERAL ARTERIOGRAPHY ; ULTRASOUND GUIDANCE FOR VASCULAR ACCESS  Physician: Rachelle Hora. Henn, MD  MEDICATIONS: Versed 5 mg, fentanyl 100 mcg. A radiology nurse monitored the patient for moderate sedation.  ANESTHESIA/SEDATION: Moderate sedation time: 1 hr and 45 min  CONTRAST:  85 mL Omnipaque 300  FLUOROSCOPY TIME:  29 min and 30 seconds  PROCEDURE: The procedure was explained to the patient. The risks and benefits of the procedure were discussed and the patient's questions were addressed. Informed consent was obtained from the patient. Patient was brought to the interventional suite. The right groin was prepped and draped in sterile fashion. Maximal barrier sterile technique was utilized including caps, mask, sterile gowns, sterile gloves, sterile drape, hand hygiene and skin antiseptic. The skin was anesthetized with 1% lidocaine. Using ultrasound guidance, a 21 gauge needle was directed into the right common femoral artery and a micropuncture dilator set was placed. A 5 French vascular sheath was placed over a Bentson wire. The superior mesenteric artery was cannulated with a C2 catheter. Superior mesenteric arteriogram was performed. The C2 catheter was used to cannulate the celiac trunk. Celiac arteriography was performed. A Glidewire was advanced into the common hepatic artery and the C2 catheter was advanced into the proximal common hepatic artery. At this point, there appeared to be extensive spasm in the common hepatic artery. C2 catheter was pulled back to the celiac trunk origin and additional angiography was performed. Multiple attempts were made to cannulate the common hepatic artery with a Renegade micro catheter. Cannulating the common hepatic artery was very difficult with the micro catheter. As result, the C2 catheter was exchanged for a Sos catheter. Eventually, a glide micro wire was advanced into the common  hepatic artery and proper hepatic artery. The Renegade catheter was advanced to the common hepatic artery and additional angiography was performed. Catheter was also advanced into the proper hepatic artery and additional angiography was performed. Multiple attempts were made to cannulate the gastroduodenal artery with a wire and catheter. These attempts at cannulating the gastroduodenal artery were unsuccessful. The micro catheter and 5 French catheter were removed. Final angiogram was performed through the right groin sheath. The groin sheath was removed with an Exoseal closure device. There was groin hemostasis at the end of the procedure.  COMPLICATIONS: None  FINDINGS: Superior mesenteric arteriogram: Patient has a metallic biliary stent and IVC filter. Patient has a large replaced right hepatic artery that originates near the origin of the superior mesenteric artery. There are small pancreatic-duodenal branches but no evidence for a vascular malformation or contrast extravasation. The delayed images demonstrate patency of the superior mesenteric veins but there is marked attenuation and narrowing of the portal confluence and main portal vein. The intrahepatic portal venous system appears to be patent.  Celiac arteriography: The splenic artery is large and patent. There is a prominent left gastric artery with filling of the right gastric artery. There is mild narrowing and irregularity at the common hepatic artery and proper hepatic artery. There appears to be irregularity of the gastroduodenal artery. There is narrowing involving the origin of the right hepatic artery. There is no evidence for acute extravasation. After a wire was advanced into the common hepatic artery, there was marked narrowing of the common hepatic artery suggestive for vasospasm and minimal flow into the gastroduodenal artery. At one point, there appeared to be retrograde flow into the gastroduodenal artery. After the vasospasm resolved,  the flow to the common hepatic  artery and gastroduodenal artery returned to baseline.  IMPRESSION: Irregularity of the common hepatic artery, gastroduodenal artery and proper hepatic artery. These findings are related to the surrounding tumor burden. There was extensive vasospasm in the common hepatic artery during this procedure but no evidence for acute bleeding or contrast extravasation. The gastroduodenal artery could never be successfully cannulated and, therefore, an empiric gastroduodenal embolization could not be performed.  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   Electronically Signed   By: Richarda Overlie M.D.   On: 02/18/2013 10:29   Ir Angiogram Selective Each Additional Vessel  02/18/2013   CLINICAL DATA:  57 year old with metastatic pancreatic cancer. Acute and recurrent upper GI bleeding. The patient has had previous duodenal bleeding based on endoscopy.  EXAM: VISCERAL ARTERIOGRAPHY ; SELECTIVE VISCERAL ARTERIOGRAPHY ; ULTRASOUND GUIDANCE FOR VASCULAR ACCESS  Physician: Rachelle Hora. Henn, MD  MEDICATIONS: Versed 5 mg, fentanyl 100 mcg. A radiology nurse monitored the patient for moderate sedation.  ANESTHESIA/SEDATION: Moderate sedation time: 1 hr and 45 min  CONTRAST:  85 mL Omnipaque 300  FLUOROSCOPY TIME:  29 min and 30 seconds  PROCEDURE: The procedure was explained to the patient. The risks and benefits of the procedure were discussed and the patient's questions were addressed. Informed consent was obtained from the patient. Patient was brought to the interventional suite. The right groin was prepped and draped in sterile fashion. Maximal barrier sterile technique was utilized including caps, mask, sterile gowns, sterile gloves, sterile drape, hand hygiene and skin antiseptic. The skin was anesthetized with 1% lidocaine. Using ultrasound guidance, a 21 gauge needle was directed into the right common femoral artery and a micropuncture dilator set was placed. A 5  French vascular sheath was placed over a Bentson wire. The superior mesenteric artery was cannulated with a C2 catheter. Superior mesenteric arteriogram was performed. The C2 catheter was used to cannulate the celiac trunk. Celiac arteriography was performed. A Glidewire was advanced into the common hepatic artery and the C2 catheter was advanced into the proximal common hepatic artery. At this point, there appeared to be extensive spasm in the common hepatic artery. C2 catheter was pulled back to the celiac trunk origin and additional angiography was performed. Multiple attempts were made to cannulate the common hepatic artery with a Renegade micro catheter. Cannulating the common hepatic artery was very difficult with the micro catheter. As result, the C2 catheter was exchanged for a Sos catheter. Eventually, a glide micro wire was advanced into the common hepatic artery and proper hepatic artery. The Renegade catheter was advanced to the common hepatic artery and additional angiography was performed. Catheter was also advanced into the proper hepatic artery and additional angiography was performed. Multiple attempts were made to cannulate the gastroduodenal artery with a wire and catheter. These attempts at cannulating the gastroduodenal artery were unsuccessful. The micro catheter and 5 French catheter were removed. Final angiogram was performed through the right groin sheath. The groin sheath was removed with an Exoseal closure device. There was groin hemostasis at the end of the procedure.  COMPLICATIONS: None  FINDINGS: Superior mesenteric arteriogram: Patient has a metallic biliary stent and IVC filter. Patient has a large replaced right hepatic artery that originates near the origin of the superior mesenteric artery. There are small pancreatic-duodenal branches but no evidence for a vascular malformation or contrast extravasation. The delayed images demonstrate patency of the superior mesenteric veins but  there is marked attenuation and narrowing of the portal  confluence and main portal vein. The intrahepatic portal venous system appears to be patent.  Celiac arteriography: The splenic artery is large and patent. There is a prominent left gastric artery with filling of the right gastric artery. There is mild narrowing and irregularity at the common hepatic artery and proper hepatic artery. There appears to be irregularity of the gastroduodenal artery. There is narrowing involving the origin of the right hepatic artery. There is no evidence for acute extravasation. After a wire was advanced into the common hepatic artery, there was marked narrowing of the common hepatic artery suggestive for vasospasm and minimal flow into the gastroduodenal artery. At one point, there appeared to be retrograde flow into the gastroduodenal artery. After the vasospasm resolved, the flow to the common hepatic artery and gastroduodenal artery returned to baseline.  IMPRESSION: Irregularity of the common hepatic artery, gastroduodenal artery and proper hepatic artery. These findings are related to the surrounding tumor burden. There was extensive vasospasm in the common hepatic artery during this procedure but no evidence for acute bleeding or contrast extravasation. The gastroduodenal artery could never be successfully cannulated and, therefore, an empiric gastroduodenal embolization could not be performed.  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   Electronically Signed   By: Richarda Overlie M.D.   On: 02/18/2013 10:29   Ir Ivc Filter Plmt / S&i /img Guid/mod Sed  02/11/2013   CLINICAL DATA:  Metastatic pancreatic carcinoma. History of pulmonary embolism. GI bleeding, a relative contraindication to anticoagulation.  EXAM: INFERIOR VENACAVOGRAM  IVC FILTER PLACEMENT UNDER FLUOROSCOPY  TECHNIQUE: The procedure, risks (including but not limited to bleeding, infection, organ damage ), benefits, and  alternatives were explained to the patient. Questions regarding the procedure were encouraged and answered. The patient understands and consents to the procedure. Caval anatomy reviewed on previous CT abdomen. Patency of the right IJ vein was confirmed with ultrasound with image documentation. An appropriate skin site was determined. Skin site was marked, prepped with chlorhexidine, and draped using maximum barrier technique. The region was infiltrated locally with 1% lidocaine.  Intravenous Fentanyl and Versed were administered as conscious sedation during continuous cardiorespiratory monitoring by the radiology RN, with a total moderate sedation time of less than 30 minutes.  Under real-time ultrasound guidance, the right IJ vein was accessed with a 21 gauge micropuncture needle; the needle tip within the vein was confirmed with ultrasound image documentation. The needle was exchanged over a 018 guidewire for a transitional dilator, which allow advancement of the [facility] wire into the IVC. A long 6 French vascular sheath was placed for inferior venacavography. This demonstrated no caval thrombus. Renal vein inflows were evident.  The [facility] IVC filter was advanced through the sheath and successfully deployed under fluoroscopy at the L2-3 disc level. Followup cavagram demonstrates stable filter position and no evident complication. The sheath was removed and hemostasis achieved at the site. No immediate complication.  FLUOROSCOPY TIME:  36 seconds  IMPRESSION: 1. Normal IVC. No thrombus or significant anatomic variation. 2. Technically successful infrarenal IVC filter placement. This is a retrievable model.   Electronically Signed   By: Oley Balm M.D.   On: 02/11/2013 13:43   Ir US Guide Vasc Access Right  02/18/2013   INDICATION: History of metastatic pancreatic cancer, now with acute and recurrent upper GI bleeding from the duodenum based on recent endoscopy. Post failed attempted endoscopic duodenal  stent placement and mesenteric arteriogram  EXAM: 1.  CELIAC ARTERIOGRAM  2.  COMMON HEPATIC ARTERIOGRAM (3RD ORDER).  3. SUB SELECTIVE GASTRODUODENAL ARTERIOGRAM (3RD ORDER) WITH PARTICLE AND COIL EMBOLIZATION  4.  ULTRASOUND GUIDANCE FOR ARTERIAL ACCESS  MEDICATIONS: Nitroglycerin 100 mcg IA  TECHNIQUE: Informed written consent was obtained from the patient after a discussion of the risks, benefits and alternatives to treatment. Questions regarding the procedure were encouraged and answered. A timeout was performed prior to the initiation of the procedure.  The right groin was prepped and draped in the usual sterile fashion, and a sterile drape was applied covering the operative field. Maximum barrier sterile technique with sterile gowns and gloves were used for the procedure. A timeout was performed prior to the initiation of the procedure. Local anesthesia was provided with 1% lidocaine.  The right femoral head was marked fluoroscopically. Under ultrasound guidance, the right common femoral artery was accessed with a micropuncture kit after the overlying soft tissues were anesthetized with 1% lidocaine. An ultrasound image was saved for documentation purposes. The micropuncture sheath was exchanged for a 5 [place] vascular sheath over a Bentson wire. A closure arteriogram was performed through the side of the sheath confirming access within the right common femoral artery.  Over a Bentson wire, a Mickelson catheter was advanced to the level of the thoracic aorta where it was back bled and flushed. The catheter was then utilized to select the celiac artery and a celiac arteriogram was performed. 100 mcg of nitroglycerin was not administered intra-arterially through the celiac arterial vascular distribution.  With the use of a Fathom 14 microwire, a regular Renegade microcatheter was advanced into the common hepatic artery and a common hepatic arteriogram was performed. The microcatheter was then advanced into the  distal aspect of the gastroduodenal artery and a sub selective gastroduodenal arteriogram was performed.  Initially, the distal tributaries of the GDA were embolized with 700-900 micron Embospheres. Limited post particle embolization arteriogram was performed. Subsequently, the gastroduodenal artery was coil embolized to near its origin with a combination of overlapping 2 and 3 mm diameter interlock coils.  The micro catheter was retracted into the common hepatic artery and a repeat common hepatic arteriogram was performed. The micro catheter was removed and a post embolization celiac arteriogram was performed. All images were reviewed and the procedure was terminated. All wires, catheters and sheaths were removed from the patient. Hemostasis was achieved at the right groin access site with manual compression.  ANESTHESIA/SEDATION: Fentanyl 200 mcg IV; Versed 4 mg IV  60 minutes  CONTRAST:  50 cc Omnipaque 300  COMPARISON:  None.  FLUOROSCOPY TIME:  11 min, 42 seconds  COMPLICATIONS: None immediate  FINDINGS: Celiac arteriogram demonstrates persistent irregular narrowing of both the common and proper hepatic arteries extending preferentially involve the origin of the right hepatic artery.  The gastroduodenal artery remains patent though irregularly narrowed throughout its course.  The gastroduodenal artery was successfully cannulated and embolized with a combination of 700 to 900 micron Embospheres and overlapping interlock coils to near its origin. 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  IMPRESSION: 1. Successful prophylactic particle and coil embolization of the gastroduodenal artery secondary to recurrent and refractory upper GI (duodenal) bleed due to malignant invasion from patient's known pancreatic head carcinoma. 2. While the common and proper hepatic arteries remain irregularly narrowed throughout their course, I do not feel that these vessels are  contributing to the patient's current hemodynamic instability and as such were not embolized.  PLAN: Pending the patient's wishes, would recommend continued aggressive conservative  management. 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   Electronically Signed   By: Simonne Come M.D.   On: 02/18/2013 13:58   Ir US Guide Vasc Access Right  02/18/2013   CLINICAL DATA:  57 year old with metastatic pancreatic cancer. Acute and recurrent upper GI bleeding. The patient has had previous duodenal bleeding based on endoscopy.  EXAM: VISCERAL ARTERIOGRAPHY ; SELECTIVE VISCERAL ARTERIOGRAPHY ; ULTRASOUND GUIDANCE FOR VASCULAR ACCESS  Physician: Rachelle Hora. Henn, MD  MEDICATIONS: Versed 5 mg, fentanyl 100 mcg. A radiology nurse monitored the patient for moderate sedation.  ANESTHESIA/SEDATION: Moderate sedation time: 1 hr and 45 min  CONTRAST:  85 mL Omnipaque 300  FLUOROSCOPY TIME:  29 min and 30 seconds  PROCEDURE: The procedure was explained to the patient. The risks and benefits of the procedure were discussed and the patient's questions were addressed. Informed consent was obtained from the patient. Patient was brought to the interventional suite. The right groin was prepped and draped in sterile fashion. Maximal barrier sterile technique was utilized including caps, mask, sterile gowns, sterile gloves, sterile drape, hand hygiene and skin antiseptic. The skin was anesthetized with 1% lidocaine. Using ultrasound guidance, a 21 gauge needle was directed into the right common femoral artery and a micropuncture dilator  set was placed. A 5 French vascular sheath was placed over a Bentson wire. The superior mesenteric artery was cannulated with a C2 catheter. Superior mesenteric arteriogram was performed. The C2 catheter was used to cannulate the celiac trunk. Celiac arteriography was performed. A Glidewire was advanced into the common hepatic artery and the C2 catheter was advanced into the proximal common hepatic artery. At this point, there appeared to be extensive spasm in the common hepatic artery. C2 catheter was pulled back to the celiac trunk origin and additional angiography was performed. Multiple attempts were made to cannulate the common hepatic artery with a Renegade micro catheter. Cannulating the common hepatic artery was very difficult with the micro catheter. As result, the C2 catheter was exchanged for a Sos catheter. Eventually, a glide micro wire was advanced into the common hepatic artery and proper hepatic artery. The Renegade catheter was advanced to the common hepatic artery and additional angiography was performed. Catheter was also advanced into the proper hepatic artery and additional angiography was performed. Multiple attempts were made to cannulate the gastroduodenal artery with a wire and catheter. These attempts at cannulating the gastroduodenal artery were unsuccessful. The micro catheter and 5 French catheter were removed. Final angiogram was performed through the right groin sheath. The groin sheath was removed with an Exoseal closure device. There was groin hemostasis at the end of the procedure.  COMPLICATIONS: None  FINDINGS: Superior mesenteric arteriogram: Patient has a metallic biliary stent and IVC filter. Patient has a large replaced right hepatic artery that originates near the origin of the superior mesenteric artery. There are small pancreatic-duodenal branches but no evidence for a vascular malformation or contrast extravasation. The delayed images demonstrate patency of the superior  mesenteric veins but there is marked attenuation and narrowing of the portal confluence and main portal vein. The intrahepatic portal venous system appears  to be patent.  Celiac arteriography: The splenic artery is large and patent. There is a prominent left gastric artery with filling of the right gastric artery. There is mild narrowing and irregularity at the common hepatic artery and proper hepatic artery. There appears to be irregularity of the gastroduodenal artery. There is narrowing involving the origin of the right hepatic artery. There is no evidence for acute extravasation. After a wire was advanced into the common hepatic artery, there was marked narrowing of the common hepatic artery suggestive for vasospasm and minimal flow into the gastroduodenal artery. At one point, there appeared to be retrograde flow into the gastroduodenal artery. After the vasospasm resolved, the flow to the common hepatic artery and gastroduodenal artery returned to baseline.  IMPRESSION: Irregularity of the common hepatic artery, gastroduodenal artery and proper hepatic artery. These findings are related to the surrounding tumor burden. There was extensive vasospasm in the common hepatic artery during this procedure but no evidence for acute bleeding or contrast extravasation. The gastroduodenal artery could never be successfully cannulated and, therefore, an empiric gastroduodenal embolization could not be performed.  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   Electronically Signed   By: Richarda Overlie M.D.   On: 02/18/2013 10:29   Dg C-arm 1-60 Min-no Report  02/16/2013   CLINICAL DATA: stent placement   C-ARM 1-60 MINUTES  Fluoroscopy was utilized by the requesting physician.  No radiographic  interpretation.    Ir Rebekah [place] [facility] [employer] Guide Roadmapping  02/18/2013   INDICATION: History of metastatic pancreatic cancer, now with acute and recurrent upper GI bleeding  from the duodenum based on recent endoscopy. Post failed attempted endoscopic duodenal stent placement and mesenteric arteriogram  EXAM: 1.  CELIAC ARTERIOGRAM  2.  COMMON HEPATIC ARTERIOGRAM (3RD ORDER).  3. SUB SELECTIVE GASTRODUODENAL ARTERIOGRAM (3RD ORDER) WITH PARTICLE AND COIL EMBOLIZATION  4.  ULTRASOUND GUIDANCE FOR ARTERIAL ACCESS  MEDICATIONS: Nitroglycerin 100 mcg IA  TECHNIQUE: Informed written consent was obtained from the patient after a discussion of the risks, benefits and alternatives to treatment. Questions regarding the procedure were encouraged and answered. A timeout was performed prior to the initiation of the procedure.  The right groin was prepped and draped in the usual sterile fashion, and a sterile drape was applied covering the operative field. Maximum barrier sterile technique with sterile gowns and gloves were used for the procedure. A timeout was performed prior to the initiation of the procedure. Local anesthesia was provided with 1% lidocaine.  The right femoral head was marked fluoroscopically. Under ultrasound guidance, the right common femoral artery was accessed with a micropuncture kit after the overlying soft tissues were anesthetized with 1% lidocaine. An ultrasound image was saved for documentation purposes. The micropuncture sheath was exchanged for a 5 [place] vascular sheath over a Bentson wire. A closure arteriogram was performed through the side of the sheath confirming access within the right common femoral artery.  Over a Bentson wire, a Mickelson catheter was advanced to the level of the thoracic aorta where it was back bled and flushed. The catheter was then utilized to select the celiac artery and a celiac arteriogram was performed. 100 mcg of nitroglycerin was not administered intra-arterially through the celiac arterial vascular distribution.  With the use of a Fathom 14 microwire, a regular Renegade microcatheter was advanced into the common hepatic artery and a  common hepatic arteriogram was performed. The microcatheter was then advanced  into the distal aspect of the gastroduodenal artery and a sub selective gastroduodenal arteriogram was performed.  Initially, the distal tributaries of the GDA were embolized with 700-900 micron Embospheres. Limited post particle embolization arteriogram was performed. Subsequently, the gastroduodenal artery was coil embolized to near its origin with a combination of overlapping 2 and 3 mm diameter interlock coils.  The micro catheter was retracted into the common hepatic artery and a repeat common hepatic arteriogram was performed. The micro catheter was removed and a post embolization celiac arteriogram was performed. All images were reviewed and the procedure was terminated. All wires, catheters and sheaths were removed from the patient. Hemostasis was achieved at the right groin access site with manual compression.  ANESTHESIA/SEDATION: Fentanyl 200 mcg IV; Versed 4 mg IV  60 minutes  CONTRAST:  50 cc Omnipaque 300  COMPARISON:  None.  FLUOROSCOPY TIME:  11 min, 42 seconds  COMPLICATIONS: None immediate  FINDINGS: Celiac arteriogram demonstrates persistent irregular narrowing of both the common and proper hepatic arteries extending preferentially involve the origin of the right hepatic artery.  The gastroduodenal artery remains patent though irregularly narrowed throughout its course.  The gastroduodenal artery was successfully cannulated and embolized with a combination of 700 to 900 micron Embospheres and overlapping interlock coils to near its origin. 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  IMPRESSION: 1. Successful prophylactic particle and coil embolization of the gastroduodenal artery secondary to recurrent and refractory upper GI (duodenal) bleed due to malignant invasion from patient's known pancreatic head carcinoma. 2. While the common and proper hepatic arteries remain  irregularly narrowed throughout their course, I do not feel that these vessels are contributing to the patient's current hemodynamic instability and as such were not embolized.  PLAN: Pending the patient's wishes, would recommend continued aggressive conservative management. 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   Electronically Signed   By: Simonne Come M.D.   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 CT angiography is performed to assess vasculature prior to potential arteriography and transcatheter embolization to treat persistent bleeding.  EXAM: CT ANGIOGRAPHY ABDOMEN AND PELVIS WITH CONTRAST AND WITHOUT CONTRAST  TECHNIQUE: Multidetector CT imaging of the abdomen and pelvis was performed using the standard protocol during bolus administration of intravenous contrast. Multiplanar reconstructed images including MIPs were obtained and reviewed to evaluate the vascular anatomy.  CONTRAST:  OMNIPAQUE IOHEXOL 350 MG/ML SOLN  COMPARISON:  12/23/2012  FINDINGS: There is significant enlargement of the mass involving the head of the pancreas since the prior CT in October. Dimensions of the mass are now approximately 3.7 x 5.6 x 4.5 cm. The mass visibly encases the pre-existing biliary stent and also extends further to encase the gastroduodenal artery, portal venous confluence, hepatic artery and a segment of the superior mesenteric vein. 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  Portal flow is present in the right lobe.  Significant progression of metastatic disease in the liver is also identified in both left and right lobes. Index lesion in the inferior right lobe shows enlargement with dimensions of 2.5 x 3.5 cm (1.6 x 2.1 cm previously). The largest left lobe lesion has enlarged from a maximum diameter of 2.4 cm previously to estimated current maximum diameter of 4.0 cm. All previously identified metastatic lesions have increased significantly in size. There are at least 4 new metastatic lesions identified in the liver. There is no evidence of associated biliary obstruction with air remaining a biliary tree. The common bile duct stent lumen does not appear to be compromised.  There is new ascites with mild to moderate overall volume of ascites present as well as nodularity of the mesenteric and omentum suspicious for carcinomatosis. No bowel obstruction or perforation is identified.  Arterial vasculature evaluated by CTA shows no evidence of significant occlusive disease. The aorta and major visceral branches are normally patent. At the level of the celiac trunk, the common hepatic artery is narrowed and encased by tumor but remains open with flow noted in both right and left hepatic arteries. The gastroduodenal artery is open but encased by tumor. Small distal GDA branches are present supplying the pancreatic mass. There also appears to be pancreaticoduodenal supply from the SMA at the level of the pancreatic mass.  The superior mesenteric artery abuts tumor but is not visibly narrowed. Focal nonocclusive thrombus or direct tumor invasion is identified in the superior aspect of the superior mesenteric vein causing luminal stenosis of approximately 60%. The splenic vein remains open.  Bilateral renal artery show normal patency. The  inferior mesenteric vein is open. Bilateral iliac and common femoral arteries show normal patency.  Appearance of the gallbladder and kidneys are unremarkable. The spleen shows a new focal wedge-shaped infarct in its posterior and inferior aspect. The rest of the spleen shows normal perfusion. There is a stable left adrenal nodule. Bony structures show degenerative changes of the spine without evidence of focal lesions.  Review of the MIP images confirms the above findings.  IMPRESSION: 1. Significant progression of metastatic pancreatic carcinoma with enlargement of the mass at the level of the head of the pancreas and progression of hepatic metastatic disease. 2. 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 Tumor also encases and narrows the common hepatic artery, right and left hepatic arteries and gastroduodenal artery. There also is tumor involvement at the level of the superior mesenteric vein. 3. 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   Electronically Signed   By: Irish Lack M.D.   On: 02/11/2013 10:34         Subjective: No complaints. States this is the best he has felt since he has been here. Had a BM earlier today without evidence of blood.  Objective: Filed Vitals:   02/22/13 1930 02/22/13 2113 02/23/13 0444 02/23/13 1449  BP: 126/78 131/81 121/79 132/86  Pulse: 96 99 90 93  Temp: 98.4 F (36.9 C) 98.3 F (36.8 C) 98.6 F (37 C) 98.3 F (36.8 C)  TempSrc: Oral Oral Oral Oral  Resp: 16 18 16 16   Height:      Weight:      SpO2:  98% 95% 97%    Intake/Output Summary (Last 24 hours) at 02/23/13 1542 Last data filed at 02/23/13 1006  Gross per  24 hour  Intake 1122.5 ml  Output      0 ml  Net 1122.5 ml   Weight change:  Exam:   General:  Pt is alert, follows commands appropriately, not in acute distress  HEENT: No icterus, No thrush,   [place]/AT  Cardiovascular: RRR, S1/S2, no rubs, no gallops  Respiratory: CTA bilaterally, no wheezing, no crackles, no rhonchi  Abdomen: Soft/+BS, non tender, non distended, no guarding  Extremities: 1+ edema, No lymphangitis, No petechiae, No rashes, no synovitis  Data Reviewed: Basic Metabolic Panel:  Recent Labs Lab 02/17/13 1903 02/18/13 0130 02/22/13 0925  NA 134* 139 137  K 3.4* 3.6 3.2*  CL 106 110 108  CO2 21 22 20   GLUCOSE 138* 106* 175*  BUN 13 12 9   CREATININE 0.74 0.77 0.83  CALCIUM 8.1* 8.6 8.4   Liver Function Tests:  Recent Labs Lab 02/22/13 0925  AST 28  ALT 24  ALKPHOS 88  BILITOT 0.5  PROT 5.4*  ALBUMIN 2.2*   No results found for this basename: LIPASE, AMYLASE,  in the last 168 hours No results found for this basename: AMMONIA,  in the last 168 hours CBC:  Recent Labs Lab 02/19/13 0800 02/20/13 0600 02/21/13 0625 02/21/13 2030 02/22/13 0600 02/23/13 0435  WBC 8.6 7.3 7.2  --  8.1 9.3  HGB 9.5* 9.0* 8.9* 9.2* 8.7* 9.5*  HCT 27.6* 26.6* 26.4* 27.3* 26.0* 27.8*  MCV 87.9 88.4 88.3  --  89.3 88.3  PLT 84* 84* 63*  --  85* 73*   Cardiac Enzymes: No results found for this basename: CKTOTAL, CKMB, CKMBINDEX, TROPONINI,  in the last 168 hours BNP: No components found with this basename: POCBNP,  CBG: No results found for this basename: GLUCAP,  in the last 168 hours  No results found for this or any previous visit (from the past 240 hour(s)).   Scheduled Meds: . folic acid  2 mg Oral Daily  . lipase/protease/amylase  2 capsule Oral TID AC & HS  . pantoprazole  80 mg Oral Daily  . sodium chloride  3 mL Intravenous Q12H  . sucralfate  2 g Oral Custom   Continuous Infusions: . sodium chloride 30 mL/hr at 02/22/13 0601     Chaya Jan, MD  [facility] Pager [phone]  If 7PM-7AM, please contact night-coverage [email or web address] Password [facility] 02/23/2013, 3:42 PM   LOS: 8 days

## 2013-02-24 ENCOUNTER — Inpatient Hospital Stay (HOSPITAL_COMMUNITY): Payer: BC Managed Care – PPO

## 2013-02-24 ENCOUNTER — Ambulatory Visit
Admit: 2013-02-24 | Discharge: 2013-02-24 | Disposition: A | Discharge status: Home / Self Care | Payer: BC Managed Care – PPO | Attending: Radiation Oncology | Admitting: Radiation Oncology

## 2013-02-24 DIAGNOSIS — J45909 Unspecified asthma, uncomplicated: Secondary | ICD-10-CM

## 2013-02-24 LAB — CBC
HCT: 27.1 % — ABNORMAL LOW (ref 39.0–52.0)
MCH: 30.6 pg (ref 26.0–34.0)
MCHC: 33.9 g/dL (ref 30.0–36.0)
MCV: 90 fL (ref 78.0–100.0)
Platelets: 66 10*3/uL — ABNORMAL LOW (ref 150–400)
RBC: 3.01 MIL/uL — ABNORMAL LOW (ref 4.22–5.81)
RDW: 16.4 % — ABNORMAL HIGH (ref 11.5–15.5)
WBC: 8.2 10*3/uL (ref 4.0–10.5)

## 2013-02-24 NOTE — Progress Notes (Signed)
[facility] Radiation Oncology Dept Therapy Treatment Record Phone [phone]   Radiation Therapy was administered to Austin Escobar on: 02/24/2013  1:24 PM and was treatment # 9 out of a planned course of 10 treatments.

## 2013-02-24 NOTE — Progress Notes (Signed)
Patient ID: Austin Escobar  male  WGN:[id number]    DOB: 1955/11/25    DOA: 02/15/2013  PCP: [name], DO  Assessment/Plan:   Acute blood loss anemia  -Has received 9 units PRBCs during this admission; 2units FFP; 1 unit of platelets.  -Underwent embolization 12/18 of the GDA by IR 12/18.  -Has had some diarrhea overnight and noted blood in stool for the first time in days.  -appreciate GI and IR followup  -Avoid NSAIDs  -Hemoglobin has remained fairly stable. Patient is hemodynamically stable , hemoglobin 9.2 today -Full liquid diet for now  -origin of bleed likely from duodenal bulb where there is tumor invasion  -CT abdomen showed no evidence of retroperitoneal or intra-abdominal acute hemorrhage post embolization of the gastroduodenal artery, interval increase in ascites  GI bleed  -As above.   Ascites - Ultrasound guided paracentesis today with 5.6 L out  Coagulopathy  -Received 2 units FFP 02/16/2013 when his INR was 1.7  -vitamin K given 12/18   Thrombocytopenia  -Received 1 unit of platelets on 12/21.   Esophageal varices  -No bleeding noted from previous endoscopy  -Continue Protonix and Carafate   Saddle pulmonary embolus  -12/23/2012  -Status post IVC filter in December 2014  -Currently stable without any respiratory distress or hypoxemia  Atrial fibrillation  -Status post ablation 05/25/2012  -Currently in sinus rhythm  -Not a candidate for anticoagulation  Metastatic pancreatic adenocarcinoma with duodenal invasion  -Appreciate oncology followup  -continue radiation therapy.  -Dr. [name] is following   HTN  -Metoprolol tartrate is on hold due to soft blood pressures   dVT Prophylaxis:  Code Status:-LIMITED CODE--NO CPR, NO SHOCK, NO INTUBATION, YES VASOPRESSOR MEDS   Family Communication:Discussed with the patient   Disposition:    Subjective: Feels better after paracentesis done today   Objective: Weight change:   Intake/Output  Summary (Last 24 hours) at 02/24/13 1223 Last data filed at 02/24/13 0900  Gross per 24 hour  Intake    360 ml  Output      0 ml  Net    360 ml   Blood pressure 121/79, pulse 89, temperature 98 F (36.7 C), temperature source Oral, resp. rate 16, height 6\' 1"  (1.854 m), weight 99.066 kg (218 lb 6.4 oz), SpO2 96.00%.  Physical Exam: General: Alert and awake, oriented x3, not in any acute distress. CVS: S1-S2 clear, no murmur rubs or gallops Chest: clear to auscultation bilaterally, no wheezing, rales or rhonchi Abdomen: soft nontender, nondistended, normal bowel sounds  Extremities: no cyanosis, clubbing. 1+ edema noted bilaterally Neuro: Cranial nerves II-XII intact, no focal neurological deficits  Lab Results: Basic Metabolic Panel:  Recent Labs Lab 02/18/13 0130 02/22/13 0925  NA 139 137  K 3.6 3.2*  CL 110 108  CO2 22 20  GLUCOSE 106* 175*  BUN 12 9  CREATININE 0.77 0.83  CALCIUM 8.6 8.4   Liver Function Tests:  Recent Labs Lab 02/22/13 0925  AST 28  ALT 24  ALKPHOS 88  BILITOT 0.5  PROT 5.4*  ALBUMIN 2.2*   No results found for this basename: LIPASE, AMYLASE,  in the last 168 hours No results found for this basename: AMMONIA,  in the last 168 hours CBC:  Recent Labs Lab 02/23/13 0435 02/24/13 0520  WBC 9.3 8.2  HGB 9.5* 9.2*  HCT 27.8* 27.1*  MCV 88.3 90.0  PLT 73* 66*   Cardiac Enzymes: No results found for this basename: CKTOTAL, CKMB, CKMBINDEX, TROPONINI,  in the last 168 hours BNP: No components found with this basename: POCBNP,  CBG: No results found for this basename: GLUCAP,  in the last 168 hours   Micro Results: No results found for this or any previous visit (from the past 240 hour(s)).  Studies/Results: Ir Angiogram Visceral Selective  02/18/2013   INDICATION: History of metastatic pancreatic cancer, now with acute and recurrent upper GI bleeding from the duodenum based on recent endoscopy. Post failed attempted endoscopic  duodenal stent placement and mesenteric arteriogram  EXAM: 1.  CELIAC ARTERIOGRAM  2.  COMMON HEPATIC ARTERIOGRAM (3RD ORDER).  3. SUB SELECTIVE GASTRODUODENAL ARTERIOGRAM (3RD ORDER) WITH PARTICLE AND COIL EMBOLIZATION  4.  ULTRASOUND GUIDANCE FOR ARTERIAL ACCESS  MEDICATIONS: Nitroglycerin 100 mcg IA  TECHNIQUE: Informed written consent was obtained from the patient after a discussion of the risks, benefits and alternatives to treatment. Questions regarding the procedure were encouraged and answered. A timeout was performed prior to the initiation of the procedure.  The right groin was prepped and draped in the usual sterile fashion, and a sterile drape was applied covering the operative field. Maximum barrier sterile technique with sterile gowns and gloves were used for the procedure. A timeout was performed prior to the initiation of the procedure. Local anesthesia was provided with 1% lidocaine.  The right femoral head was marked fluoroscopically. Under ultrasound guidance, the right common femoral artery was accessed with a micropuncture kit after the overlying soft tissues were anesthetized with 1% lidocaine. An ultrasound image was saved for documentation purposes. The micropuncture sheath was exchanged for a 5 [place] vascular sheath over a Bentson wire. A closure arteriogram was performed through the side of the sheath confirming access within the right common femoral artery.  Over a Bentson wire, a Mickelson catheter was advanced to the level of the thoracic aorta where it was back bled and flushed. The catheter was then utilized to select the celiac artery and a celiac arteriogram was performed. 100 mcg of nitroglycerin was not administered intra-arterially through the celiac arterial vascular distribution.  With the use of a Fathom 14 microwire, a regular Renegade microcatheter was advanced into the common hepatic artery and a common hepatic arteriogram was performed. The microcatheter was then advanced  into the distal aspect of the gastroduodenal artery and a sub selective gastroduodenal arteriogram was performed.  Initially, the distal tributaries of the GDA were embolized with 700-900 micron Embospheres. Limited post particle embolization arteriogram was performed. Subsequently, the gastroduodenal artery was coil embolized to near its origin with a combination of overlapping 2 and 3 mm diameter interlock coils.  The micro catheter was retracted into the common hepatic artery and a repeat common hepatic arteriogram was performed. The micro catheter was removed and a post embolization celiac arteriogram was performed. All images were reviewed and the procedure was terminated. All wires, catheters and sheaths were removed from the patient. Hemostasis was achieved at the right groin access site with manual compression.  ANESTHESIA/SEDATION: Fentanyl 200 mcg IV; Versed 4 mg IV  60 minutes  CONTRAST:  50 cc Omnipaque 300  COMPARISON:  None.  FLUOROSCOPY TIME:  11 min, 42 seconds  COMPLICATIONS: None immediate  FINDINGS: Celiac arteriogram demonstrates persistent irregular narrowing of both the common and proper hepatic arteries extending preferentially involve the origin of the right hepatic artery.  The gastroduodenal artery remains patent though irregularly narrowed throughout its course.  The gastroduodenal artery was successfully cannulated and embolized with a combination of 700 to 900 micron Embospheres  and overlapping interlock coils to near its origin. 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  IMPRESSION: 1. Successful prophylactic particle and coil embolization of the gastroduodenal artery secondary to recurrent and refractory upper GI (duodenal) bleed due to malignant invasion from patient's known pancreatic head carcinoma. 2. While the common and proper hepatic arteries remain irregularly narrowed throughout their course, I do not feel that these vessels  are contributing to the patient's current hemodynamic instability and as such were not embolized.  PLAN: Pending the patient's wishes, would recommend continued aggressive conservative management. 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   Electronically Signed   By: Simonne Come M.D.   On: 02/18/2013 13:58   Ir Angiogram Visceral Selective  02/18/2013   CLINICAL DATA:  57 year old with metastatic pancreatic cancer. Acute and recurrent upper GI bleeding. The patient has had previous duodenal bleeding based on endoscopy.  EXAM: VISCERAL ARTERIOGRAPHY ; SELECTIVE VISCERAL ARTERIOGRAPHY ; ULTRASOUND GUIDANCE FOR VASCULAR ACCESS  Physician: Rachelle Hora. Henn, MD  MEDICATIONS: Versed 5 mg, fentanyl 100 mcg. A radiology nurse monitored the patient for moderate sedation.  ANESTHESIA/SEDATION: Moderate sedation time: 1 hr and 45 min  CONTRAST:  85 mL Omnipaque 300  FLUOROSCOPY TIME:  29 min and 30 seconds  PROCEDURE: The procedure was explained to the patient. The risks and benefits of the procedure were discussed and the patient's questions were addressed. Informed consent was obtained from the patient. Patient was brought to the interventional suite. The right groin was prepped and draped in sterile fashion. Maximal barrier sterile technique was utilized including caps, mask, sterile gowns, sterile gloves, sterile drape, hand hygiene and skin antiseptic. The skin was anesthetized with 1% lidocaine. Using ultrasound guidance, a 21 gauge needle was directed into the right common femoral artery and a micropuncture  dilator set was placed. A 5 French vascular sheath was placed over a Bentson wire. The superior mesenteric artery was cannulated with a C2 catheter. Superior mesenteric arteriogram was performed. The C2 catheter was used to cannulate the celiac trunk. Celiac arteriography was performed. A Glidewire was advanced into the common hepatic artery and the C2 catheter was advanced into the proximal common hepatic artery. At this point, there appeared to be extensive spasm in the common hepatic artery. C2 catheter was pulled back to the celiac trunk origin and additional angiography was performed. Multiple attempts were made to cannulate the common hepatic artery with a Renegade micro catheter. Cannulating the common hepatic artery was very difficult with the micro catheter. As result, the C2 catheter was exchanged for a Sos catheter. Eventually, a glide micro wire was advanced into the common hepatic artery and proper hepatic artery. The Renegade catheter was advanced to the common hepatic artery and additional angiography was performed. Catheter was also advanced into the proper hepatic artery and additional angiography was performed. Multiple attempts were made to cannulate the gastroduodenal artery with a wire and catheter. These attempts at cannulating the gastroduodenal artery were unsuccessful. The micro catheter and 5 French catheter were removed. Final angiogram was performed through the right groin sheath. The groin sheath was removed with an  Exoseal closure device. There was groin hemostasis at the end of the procedure.  COMPLICATIONS: None  FINDINGS: Superior mesenteric arteriogram: Patient has a metallic biliary stent and IVC filter. Patient has a large replaced right hepatic artery that originates near the origin of the superior mesenteric artery. There are small pancreatic-duodenal branches but no evidence for a vascular malformation or contrast extravasation. The delayed images demonstrate patency of the  superior mesenteric veins but there is marked attenuation and narrowing of the portal confluence and main portal vein. The intrahepatic portal venous system appears to be patent.  Celiac arteriography: The splenic artery is large and patent. There is a prominent left gastric artery with filling of the right gastric artery. There is mild narrowing and irregularity at the common hepatic artery and proper hepatic artery. There appears to be irregularity of the gastroduodenal artery. There is narrowing involving the origin of the right hepatic artery. There is no evidence for acute extravasation. After a wire was advanced into the common hepatic artery, there was marked narrowing of the common hepatic artery suggestive for vasospasm and minimal flow into the gastroduodenal artery. At one point, there appeared to be retrograde flow into the gastroduodenal artery. After the vasospasm resolved, the flow to the common hepatic artery and gastroduodenal artery returned to baseline.  IMPRESSION: Irregularity of the common hepatic artery, gastroduodenal artery and proper hepatic artery. These findings are related to the surrounding tumor burden. There was extensive vasospasm in the common hepatic artery during this procedure but no evidence for acute bleeding or contrast extravasation. The gastroduodenal artery could never be successfully cannulated and, therefore, an empiric gastroduodenal embolization could not be performed.  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   Electronically Signed   By: Richarda Overlie M.D.   On: 02/18/2013 10:29   Ir Angiogram Visceral Selective  02/18/2013   CLINICAL DATA:  57 year old with metastatic pancreatic cancer. Acute and recurrent upper GI bleeding. The patient has had previous duodenal bleeding based on endoscopy.  EXAM: VISCERAL ARTERIOGRAPHY ; SELECTIVE VISCERAL ARTERIOGRAPHY ; ULTRASOUND GUIDANCE FOR VASCULAR ACCESS  Physician: Rachelle Hora. Henn, MD   MEDICATIONS: Versed 5 mg, fentanyl 100 mcg. A radiology nurse monitored the patient for moderate sedation.  ANESTHESIA/SEDATION: Moderate sedation time: 1 hr and 45 min  CONTRAST:  85 mL Omnipaque 300  FLUOROSCOPY TIME:  29 min and 30 seconds  PROCEDURE: The procedure was explained to the patient. The risks and benefits of the procedure were discussed and the patient's questions were addressed. Informed consent was obtained from the patient. Patient was brought to the interventional suite. The right groin was prepped and draped in sterile fashion. Maximal barrier sterile technique was utilized including caps, mask, sterile gowns, sterile gloves, sterile drape, hand hygiene and skin antiseptic. The skin was anesthetized with 1% lidocaine. Using ultrasound guidance, a 21 gauge needle was directed into the right common femoral artery and a micropuncture dilator set was placed. A 5 French vascular sheath was placed over a Bentson wire. The superior mesenteric artery was cannulated with a C2 catheter. Superior mesenteric arteriogram was performed. The C2 catheter was used to cannulate the celiac trunk. Celiac arteriography was performed. A Glidewire was advanced into the common hepatic artery and the C2 catheter was advanced into the proximal common hepatic artery. At this point, there appeared to be extensive spasm in the common hepatic artery. C2 catheter was pulled back to the celiac trunk origin and additional angiography was performed. Multiple  attempts were made to cannulate the common hepatic artery with a Renegade micro catheter. Cannulating the common hepatic artery was very difficult with the micro catheter. As result, the C2 catheter was exchanged for a Sos catheter. Eventually, a glide micro wire was advanced into the common hepatic artery and proper hepatic artery. The Renegade catheter was advanced to the common hepatic artery and additional angiography was performed. Catheter was also advanced into the  proper hepatic artery and additional angiography was performed. Multiple attempts were made to cannulate the gastroduodenal artery with a wire and catheter. These attempts at cannulating the gastroduodenal artery were unsuccessful. The micro catheter and 5 French catheter were removed. Final angiogram was performed through the right groin sheath. The groin sheath was removed with an Exoseal closure device. There was groin hemostasis at the end of the procedure.  COMPLICATIONS: None  FINDINGS: Superior mesenteric arteriogram: Patient has a metallic biliary stent and IVC filter. Patient has a large replaced right hepatic artery that originates near the origin of the superior mesenteric artery. There are small pancreatic-duodenal branches but no evidence for a vascular malformation or contrast extravasation. The delayed images demonstrate patency of the superior mesenteric veins but there is marked attenuation and narrowing of the portal confluence and main portal vein. The intrahepatic portal venous system appears to be patent.  Celiac arteriography: The splenic artery is large and patent. There is a prominent left gastric artery with filling of the right gastric artery. There is mild narrowing and irregularity at the common hepatic artery and proper hepatic artery. There appears to be irregularity of the gastroduodenal artery. There is narrowing involving the origin of the right hepatic artery. There is no evidence for acute extravasation. After a wire was advanced into the common hepatic artery, there was marked narrowing of the common hepatic artery suggestive for vasospasm and minimal flow into the gastroduodenal artery. At one point, there appeared to be retrograde flow into the gastroduodenal artery. After the vasospasm resolved, the flow to the common hepatic artery and gastroduodenal artery returned to baseline.  IMPRESSION: Irregularity of the common hepatic artery, gastroduodenal artery and proper hepatic  artery. These findings are related to the surrounding tumor burden. There was extensive vasospasm in the common hepatic artery during this procedure but no evidence for acute bleeding or contrast extravasation. The gastroduodenal artery could never be successfully cannulated and, therefore, an empiric gastroduodenal embolization could not be performed.  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   Electronically Signed   By: Richarda Overlie M.D.   On: 02/18/2013 10:29   Ir Angiogram Selective Each Additional Vessel  02/18/2013   INDICATION: History of metastatic pancreatic cancer, now with acute and recurrent upper GI bleeding from the duodenum based on recent endoscopy. Post failed attempted endoscopic duodenal stent placement and mesenteric arteriogram  EXAM: 1.  CELIAC ARTERIOGRAM  2.  COMMON HEPATIC ARTERIOGRAM (3RD ORDER).  3. SUB SELECTIVE GASTRODUODENAL ARTERIOGRAM (3RD ORDER) WITH PARTICLE AND COIL EMBOLIZATION  4.  ULTRASOUND GUIDANCE FOR ARTERIAL ACCESS  MEDICATIONS: Nitroglycerin 100 mcg IA  TECHNIQUE: Informed written consent was obtained from the patient after a discussion of the risks, benefits and alternatives to treatment. Questions regarding the procedure were encouraged and answered. A timeout was performed prior to the initiation of the procedure.  The right groin was prepped and draped in the usual sterile fashion, and a sterile drape was applied covering the operative field. Maximum barrier sterile technique with sterile gowns and  gloves were used for the procedure. A timeout was performed prior to the initiation of the procedure. Local anesthesia was provided with 1% lidocaine.  The right femoral head was marked fluoroscopically. Under ultrasound guidance, the right common femoral artery was accessed with a micropuncture kit after the overlying soft tissues were anesthetized with 1% lidocaine. An ultrasound image was saved for documentation purposes. The  micropuncture sheath was exchanged for a 5 [place] vascular sheath over a Bentson wire. A closure arteriogram was performed through the side of the sheath confirming access within the right common femoral artery.  Over a Bentson wire, a Mickelson catheter was advanced to the level of the thoracic aorta where it was back bled and flushed. The catheter was then utilized to select the celiac artery and a celiac arteriogram was performed. 100 mcg of nitroglycerin was not administered intra-arterially through the celiac arterial vascular distribution.  With the use of a Fathom 14 microwire, a regular Renegade microcatheter was advanced into the common hepatic artery and a common hepatic arteriogram was performed. The microcatheter was then advanced into the distal aspect of the gastroduodenal artery and a sub selective gastroduodenal arteriogram was performed.  Initially, the distal tributaries of the GDA were embolized with 700-900 micron Embospheres. Limited post particle embolization arteriogram was performed. Subsequently, the gastroduodenal artery was coil embolized to near its origin with a combination of overlapping 2 and 3 mm diameter interlock coils.  The micro catheter was retracted into the common hepatic artery and a repeat common hepatic arteriogram was performed. The micro catheter was removed and a post embolization celiac arteriogram was performed. All images were reviewed and the procedure was terminated. All wires, catheters and sheaths were removed from the patient. Hemostasis was achieved at the right groin access site with manual compression.  ANESTHESIA/SEDATION: Fentanyl 200 mcg IV; Versed 4 mg IV  60 minutes  CONTRAST:  50 cc Omnipaque 300  COMPARISON:  None.  FLUOROSCOPY TIME:  11 min, 42 seconds  COMPLICATIONS: None immediate  FINDINGS: Celiac arteriogram demonstrates persistent irregular narrowing of both the common and proper hepatic arteries extending preferentially involve the origin of the  right hepatic artery.  The gastroduodenal artery remains patent though irregularly narrowed throughout its course.  The gastroduodenal artery was successfully cannulated and embolized with a combination of 700 to 900 micron Embospheres and overlapping interlock coils to near its origin. 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  IMPRESSION: 1. Successful prophylactic particle and coil embolization of the gastroduodenal artery secondary to recurrent and refractory upper GI (duodenal) bleed due to malignant invasion from patient's known pancreatic head carcinoma. 2. While the common and proper hepatic arteries remain irregularly narrowed throughout their course, I do not feel that these vessels are contributing to the patient's current hemodynamic instability and as such were not embolized.  PLAN: Pending the patient's wishes, would recommend continued aggressive conservative management. 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   Electronically Signed   By: Simonne Come M.D.   On: 02/18/2013 13:58   Ir Angiogram Selective Each Additional Vessel  02/18/2013   CLINICAL DATA:  58 year old  with metastatic pancreatic cancer. Acute and recurrent upper GI bleeding. The patient has had previous duodenal bleeding based on endoscopy.  EXAM: VISCERAL ARTERIOGRAPHY ; SELECTIVE VISCERAL ARTERIOGRAPHY ; ULTRASOUND GUIDANCE FOR VASCULAR ACCESS  Physician: Rachelle Hora. Henn, MD  MEDICATIONS: Versed 5 mg, fentanyl 100 mcg. A radiology nurse monitored the patient for moderate  sedation.  ANESTHESIA/SEDATION: Moderate sedation time: 1 hr and 45 min  CONTRAST:  85 mL Omnipaque 300  FLUOROSCOPY TIME:  29 min and 30 seconds  PROCEDURE: The procedure was explained to the patient. The risks and benefits of the procedure were discussed and the patient's questions were addressed. Informed consent was obtained from the patient. Patient was brought to the interventional suite. The right groin was prepped and draped in sterile fashion. Maximal barrier sterile technique was utilized including caps, mask, sterile gowns, sterile gloves, sterile drape, hand hygiene and skin antiseptic. The skin was anesthetized with 1% lidocaine. Using ultrasound guidance, a 21 gauge needle was directed into the right common femoral artery and a micropuncture dilator set was placed. A 5 French vascular sheath was placed over a Bentson wire. The superior mesenteric artery was cannulated with a C2 catheter. Superior mesenteric arteriogram was performed. The C2 catheter was used to cannulate the celiac trunk. Celiac arteriography was performed. A Glidewire was advanced into the common hepatic artery and the C2 catheter was advanced into the proximal common hepatic artery. At this point, there appeared to be extensive spasm in the common hepatic artery. C2 catheter was pulled back to the celiac trunk origin and additional angiography was performed. Multiple attempts were made to cannulate the common hepatic artery with a Renegade micro catheter. Cannulating the common hepatic artery was very difficult with the micro catheter. As result, the C2 catheter was exchanged for a Sos catheter. Eventually, a glide micro wire was advanced into the common hepatic artery and proper hepatic artery. The Renegade catheter was advanced to the common hepatic artery and additional angiography was performed. Catheter was also advanced into the proper hepatic artery and additional angiography was performed. Multiple attempts were made to  cannulate the gastroduodenal artery with a wire and catheter. These attempts at cannulating the gastroduodenal artery were unsuccessful. The micro catheter and 5 French catheter were removed. Final angiogram was performed through the right groin sheath. The groin sheath was removed with an Exoseal closure device. There was groin hemostasis at the end of the procedure.  COMPLICATIONS: None  FINDINGS: Superior mesenteric arteriogram: Patient has a metallic biliary stent and IVC filter. Patient has a large replaced right hepatic artery that originates near the origin of the superior mesenteric artery. There are small pancreatic-duodenal branches but no evidence for a vascular malformation or contrast extravasation. The delayed images demonstrate patency of the superior mesenteric veins but there is marked attenuation and narrowing of the portal confluence and main portal vein. The intrahepatic portal venous system appears to be patent.  Celiac arteriography: The splenic artery is large and patent. There is a prominent left gastric artery with filling of the right gastric artery. There is mild narrowing and irregularity at the common hepatic artery and proper hepatic artery. There appears to be irregularity of the gastroduodenal artery. There is narrowing involving the origin of the right hepatic artery. There is no evidence for acute extravasation. After a wire was advanced into the common hepatic artery, there was marked narrowing of the common hepatic artery suggestive for vasospasm and minimal flow into the gastroduodenal artery. At one point, there appeared  to be retrograde flow into the gastroduodenal artery. After the vasospasm resolved, the flow to the common hepatic artery and gastroduodenal artery returned to baseline.  IMPRESSION: Irregularity of the common hepatic artery, gastroduodenal artery and proper hepatic artery. These findings are related to the surrounding tumor burden. There was extensive vasospasm  in the common hepatic artery during this procedure but no evidence for acute bleeding or contrast extravasation. The gastroduodenal artery could never be successfully cannulated and, therefore, an empiric gastroduodenal embolization could not be performed.  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   Electronically Signed   By: Richarda Overlie M.D.   On: 02/18/2013 10:29   Ir Angiogram Selective Each Additional Vessel  02/18/2013   CLINICAL DATA:  57 year old with metastatic pancreatic cancer. Acute and recurrent upper GI bleeding. The patient has had previous duodenal bleeding based on endoscopy.  EXAM: VISCERAL ARTERIOGRAPHY ; SELECTIVE VISCERAL ARTERIOGRAPHY ; ULTRASOUND GUIDANCE FOR VASCULAR ACCESS  Physician: Rachelle Hora. Henn, MD  MEDICATIONS: Versed 5 mg, fentanyl 100 mcg. A radiology nurse monitored the patient for moderate sedation.  ANESTHESIA/SEDATION: Moderate sedation time: 1 hr and 45 min  CONTRAST:  85 mL Omnipaque 300  FLUOROSCOPY TIME:  29 min and 30 seconds  PROCEDURE: The procedure was explained to the patient. The risks and benefits of the procedure were discussed and the patient's questions were addressed. Informed consent was obtained from the patient. Patient was brought to the interventional suite. The right groin was prepped and draped in sterile fashion. Maximal barrier sterile technique was utilized including caps, mask, sterile gowns, sterile gloves, sterile drape, hand hygiene and skin antiseptic. The skin was anesthetized with 1% lidocaine. Using ultrasound guidance, a 21 gauge needle was directed into the right common femoral artery and a micropuncture dilator set was placed. A 5 French vascular sheath was placed over a Bentson wire. The superior mesenteric artery was cannulated with a C2 catheter. Superior mesenteric arteriogram was performed. The C2 catheter was used to cannulate the celiac trunk. Celiac arteriography was performed. A Glidewire was  advanced into the common hepatic artery and the C2 catheter was advanced into the proximal common hepatic artery. At this point, there appeared to be extensive spasm in the common hepatic artery. C2 catheter was pulled back to the celiac trunk origin and additional angiography was performed. Multiple attempts were made to cannulate the common hepatic artery with a Renegade micro catheter. Cannulating the common hepatic artery was very difficult with the micro catheter. As result, the C2 catheter was exchanged for a Sos catheter. Eventually, a glide micro wire was advanced into the common hepatic artery and proper hepatic artery. The Renegade catheter was advanced to the common hepatic artery and additional angiography was performed. Catheter was also advanced into the proper hepatic artery and additional angiography was performed. Multiple attempts were made to cannulate the gastroduodenal artery with a wire and catheter. These attempts at cannulating the gastroduodenal artery were unsuccessful. The micro catheter and 5 French catheter were removed. Final angiogram was performed through the right groin sheath. The groin sheath was removed with an Exoseal closure device. There was groin hemostasis at the end of the procedure.  COMPLICATIONS: None  FINDINGS: Superior mesenteric arteriogram: Patient has a metallic biliary stent and IVC filter. Patient has a large replaced right hepatic artery that originates near the origin of the superior mesenteric artery. There are small pancreatic-duodenal branches but no evidence for a vascular malformation or contrast extravasation. The delayed  images demonstrate patency of the superior mesenteric veins but there is marked attenuation and narrowing of the portal confluence and main portal vein. The intrahepatic portal venous system appears to be patent.  Celiac arteriography: The splenic artery is large and patent. There is a prominent left gastric artery with filling of the  right gastric artery. There is mild narrowing and irregularity at the common hepatic artery and proper hepatic artery. There appears to be irregularity of the gastroduodenal artery. There is narrowing involving the origin of the right hepatic artery. There is no evidence for acute extravasation. After a wire was advanced into the common hepatic artery, there was marked narrowing of the common hepatic artery suggestive for vasospasm and minimal flow into the gastroduodenal artery. At one point, there appeared to be retrograde flow into the gastroduodenal artery. After the vasospasm resolved, the flow to the common hepatic artery and gastroduodenal artery returned to baseline.  IMPRESSION: Irregularity of the common hepatic artery, gastroduodenal artery and proper hepatic artery. These findings are related to the surrounding tumor burden. There was extensive vasospasm in the common hepatic artery during this procedure but no evidence for acute bleeding or contrast extravasation. The gastroduodenal artery could never be successfully cannulated and, therefore, an empiric gastroduodenal embolization could not be performed.  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   Electronically Signed   By: Richarda Overlie M.D.   On: 02/18/2013 10:29   Ir Ivc Filter Plmt / S&i /img Guid/mod Sed  02/11/2013   CLINICAL DATA:  Metastatic pancreatic carcinoma. History of pulmonary embolism. GI bleeding, a relative contraindication to anticoagulation.  EXAM: INFERIOR VENACAVOGRAM  IVC FILTER PLACEMENT UNDER FLUOROSCOPY  TECHNIQUE: The procedure, risks (including but not limited to bleeding, infection, organ damage ), benefits, and alternatives were explained to the patient. Questions regarding the procedure were encouraged and answered. The patient understands and consents to the procedure. Caval anatomy reviewed on previous CT abdomen. Patency of the right IJ vein was confirmed with ultrasound with  image documentation. An appropriate skin site was determined. Skin site was marked, prepped with chlorhexidine, and draped using maximum barrier technique. The region was infiltrated locally with 1% lidocaine.  Intravenous Fentanyl and Versed were administered as conscious sedation during continuous cardiorespiratory monitoring by the radiology RN, with a total moderate sedation time of less than 30 minutes.  Under real-time ultrasound guidance, the right IJ vein was accessed with a 21 gauge micropuncture needle; the needle tip within the vein was confirmed with ultrasound image documentation. The needle was exchanged over a 018 guidewire for a transitional dilator, which allow advancement of the [facility] wire into the IVC. A long 6 French vascular sheath was placed for inferior venacavography. This demonstrated no caval thrombus. Renal vein inflows were evident.  [facility] IVC filter was advanced through the sheath and successfully deployed under fluoroscopy at the L2-3 disc level. Followup cavagram demonstrates stable filter position and no evident complication. The sheath was removed and hemostasis achieved at the site. No immediate complication.  FLUOROSCOPY TIME:  36 seconds  IMPRESSION: 1. Normal IVC. No thrombus or significant anatomic variation. 2. Technically successful infrarenal IVC filter placement. This is a retrievable model.   Electronically Signed   By: Oley Balm M.D.   On: 02/11/2013 13:43   US Paracentesis  02/24/2013   CLINICAL DATA:  Abdominal distention, ascites. Request diagnostic and therapeutic paracentesis.  EXAM: ULTRASOUND GUIDED PARACENTESIS  COMPARISON:  None  PROCEDURE: An ultrasound guided  paracentesis was thoroughly discussed with the patient and questions answered. The benefits, risks, alternatives and complications were also discussed. The patient understands and wishes to proceed with the procedure. Written consent was obtained.  Ultrasound was performed to localize and  mark an adequate pocket of fluid in the right lower quadrant of the abdomen. The area was then prepped and draped in the normal sterile fashion. 1% Lidocaine was used for local anesthesia. Under ultrasound guidance a 19 gauge Yueh catheter was introduced. Paracentesis was performed. The catheter was removed and a dressing applied.  COMPLICATIONS: None immediate  FINDINGS: A total of approximately 5.6 L of clear yellow fluid was removed. A fluid sample was sent for laboratory analysis.  IMPRESSION: Successful ultrasound guided paracentesis yielding 5.6 L of ascites.  Read by: Brayton El PA-C   Electronically Signed   By: Maryclare Bean M.D.   On: 02/24/2013 09:29   Ir US Guide Vasc Access Right  02/18/2013   INDICATION: History of metastatic pancreatic cancer, now with acute and recurrent upper GI bleeding from the duodenum based on recent endoscopy. Post failed attempted endoscopic duodenal stent placement and mesenteric arteriogram  EXAM: 1.  CELIAC ARTERIOGRAM  2.  COMMON HEPATIC ARTERIOGRAM (3RD ORDER).  3. SUB SELECTIVE GASTRODUODENAL ARTERIOGRAM (3RD ORDER) WITH PARTICLE AND COIL EMBOLIZATION  4.  ULTRASOUND GUIDANCE FOR ARTERIAL ACCESS  MEDICATIONS: Nitroglycerin 100 mcg IA  TECHNIQUE: Informed written consent was obtained from the patient after a discussion of the risks, benefits and alternatives to treatment. Questions regarding the procedure were encouraged and answered. A timeout was performed prior to the initiation of the procedure.  The right groin was prepped and draped in the usual sterile fashion, and a sterile drape was applied covering the operative field. Maximum barrier sterile technique with sterile gowns and gloves were used for the procedure. A timeout was performed prior to the initiation of the procedure. Local anesthesia was provided with 1% lidocaine.  The right femoral head was marked fluoroscopically. Under ultrasound guidance, the right common femoral artery was accessed with a  micropuncture kit after the overlying soft tissues were anesthetized with 1% lidocaine. An ultrasound image was saved for documentation purposes. The micropuncture sheath was exchanged for a 5 [place] vascular sheath over a Bentson wire. A closure arteriogram was performed through the side of the sheath confirming access within the right common femoral artery.  Over a Bentson wire, a Mickelson catheter was advanced to the level of the thoracic aorta where it was back bled and flushed. The catheter was then utilized to select the celiac artery and a celiac arteriogram was performed. 100 mcg of nitroglycerin was not administered intra-arterially through the celiac arterial vascular distribution.  With the use of a Fathom 14 microwire, a regular Renegade microcatheter was advanced into the common hepatic artery and a common hepatic arteriogram was performed. The microcatheter was then advanced into the distal aspect of the gastroduodenal artery and a sub selective gastroduodenal arteriogram was performed.  Initially, the distal tributaries of the GDA were embolized with 700-900 micron Embospheres. Limited post particle embolization arteriogram was performed. Subsequently, the gastroduodenal artery was coil embolized to near its origin with a combination of overlapping 2 and 3 mm diameter interlock coils.  The micro catheter was retracted into the common hepatic artery and a repeat common hepatic arteriogram was performed. The micro catheter was removed and a post embolization celiac arteriogram was performed. All images were reviewed and the procedure was terminated. All wires, catheters and sheaths  were removed from the patient. Hemostasis was achieved at the right groin access site with manual compression.  ANESTHESIA/SEDATION: Fentanyl 200 mcg IV; Versed 4 mg IV  60 minutes  CONTRAST:  50 cc Omnipaque 300  COMPARISON:  None.  FLUOROSCOPY TIME:  11 min, 42 seconds  COMPLICATIONS: None immediate  FINDINGS: Celiac  arteriogram demonstrates persistent irregular narrowing of both the common and proper hepatic arteries extending preferentially involve the origin of the right hepatic artery.  The gastroduodenal artery remains patent though irregularly narrowed throughout its course.  The gastroduodenal artery was successfully cannulated and embolized with a combination of 700 to 900 micron Embospheres and overlapping interlock coils to near its origin. 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  IMPRESSION: 1. Successful prophylactic particle and coil embolization of the gastroduodenal artery secondary to recurrent and refractory upper GI (duodenal) bleed due to malignant invasion from patient's known pancreatic head carcinoma. 2. While the common and proper hepatic arteries remain irregularly narrowed throughout their course, I do not feel that these vessels are contributing to the patient's current hemodynamic instability and as such were not embolized.  PLAN: Pending the patient's wishes, would recommend continued aggressive conservative management. 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   Electronically Signed   By: Simonne Come M.D.   On: 02/18/2013 13:58   Ir US Guide Vasc Access Right  02/18/2013   CLINICAL DATA:  57 year old with metastatic pancreatic cancer. Acute and recurrent upper GI bleeding. The patient has had previous duodenal bleeding based on endoscopy.  EXAM: VISCERAL ARTERIOGRAPHY ; SELECTIVE VISCERAL ARTERIOGRAPHY ; ULTRASOUND GUIDANCE FOR  VASCULAR ACCESS  Physician: Rachelle Hora. Henn, MD  MEDICATIONS: Versed 5 mg, fentanyl 100 mcg. A radiology nurse monitored the patient for moderate sedation.  ANESTHESIA/SEDATION: Moderate sedation time: 1 hr and 45 min  CONTRAST:  85 mL Omnipaque 300  FLUOROSCOPY TIME:  29 min and 30 seconds  PROCEDURE: The procedure was explained to the patient. The risks and benefits of the procedure were discussed and the patient's questions were addressed. Informed consent was obtained from the patient. Patient was brought to the interventional suite. The right groin was prepped and draped in sterile fashion. Maximal barrier sterile technique was utilized including caps, mask, sterile gowns, sterile gloves, sterile drape, hand hygiene and skin antiseptic. The skin was anesthetized with 1% lidocaine. Using ultrasound guidance, a 21 gauge needle was directed into the right common femoral artery and a micropuncture dilator set was placed. A 5 French vascular sheath was placed over a Bentson wire. The superior mesenteric artery was cannulated with a C2 catheter. Superior mesenteric arteriogram was performed. The C2 catheter was used to cannulate the celiac trunk. Celiac arteriography was performed. A Glidewire was advanced into the common hepatic artery and the C2 catheter was advanced into the proximal common hepatic artery. At this point, there appeared to be extensive spasm in the common hepatic artery. C2 catheter was pulled back to the celiac trunk origin and additional angiography was performed. Multiple attempts were made to cannulate the common hepatic artery with a Renegade micro catheter. Cannulating the common hepatic artery  was very difficult with the micro catheter. As result, the C2 catheter was exchanged for a Sos catheter. Eventually, a glide micro wire was advanced into the common hepatic artery and proper hepatic artery. The Renegade catheter was advanced to the common hepatic artery and additional angiography was  performed. Catheter was also advanced into the proper hepatic artery and additional angiography was performed. Multiple attempts were made to cannulate the gastroduodenal artery with a wire and catheter. These attempts at cannulating the gastroduodenal artery were unsuccessful. The micro catheter and 5 French catheter were removed. Final angiogram was performed through the right groin sheath. The groin sheath was removed with an Exoseal closure device. There was groin hemostasis at the end of the procedure.  COMPLICATIONS: None  FINDINGS: Superior mesenteric arteriogram: Patient has a metallic biliary stent and IVC filter. Patient has a large replaced right hepatic artery that originates near the origin of the superior mesenteric artery. There are small pancreatic-duodenal branches but no evidence for a vascular malformation or contrast extravasation. The delayed images demonstrate patency of the superior mesenteric veins but there is marked attenuation and narrowing of the portal confluence and main portal vein. The intrahepatic portal venous system appears to be patent.  Celiac arteriography: The splenic artery is large and patent. There is a prominent left gastric artery with filling of the right gastric artery. There is mild narrowing and irregularity at the common hepatic artery and proper hepatic artery. There appears to be irregularity of the gastroduodenal artery. There is narrowing involving the origin of the right hepatic artery. There is no evidence for acute extravasation. After a wire was advanced into the common hepatic artery, there was marked narrowing of the common hepatic artery suggestive for vasospasm and minimal flow into the gastroduodenal artery. At one point, there appeared to be retrograde flow into the gastroduodenal artery. After the vasospasm resolved, the flow to the common hepatic artery and gastroduodenal artery returned to baseline.  IMPRESSION: Irregularity of the common hepatic  artery, gastroduodenal artery and proper hepatic artery. These findings are related to the surrounding tumor burden. There was extensive vasospasm in the common hepatic artery during this procedure but no evidence for acute bleeding or contrast extravasation. The gastroduodenal artery could never be successfully cannulated and, therefore, an empiric gastroduodenal embolization could not be performed.  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   Electronically Signed   By: Richarda Overlie M.D.   On: 02/18/2013 10:29   Dg C-arm 1-60 Min-no Report  02/16/2013   CLINICAL DATA: stent placement   C-ARM 1-60 MINUTES  Fluoroscopy was utilized by the requesting physician.  No radiographic  interpretation.    Ir Rebekah [place] [facility] [employer] Guide Road[employer]  02/18/2013   INDICATION: History of metastatic pancreatic cancer, now with acute and recurrent upper GI bleeding from the duodenum based on recent endoscopy. Post failed attempted endoscopic duodenal stent placement and mesenteric arteriogram  EXAM: 1.  CELIAC ARTERIOGRAM  2.  COMMON HEPATIC ARTERIOGRAM (3RD ORDER).  3. SUB SELECTIVE GASTRODUODENAL ARTERIOGRAM (3RD ORDER) WITH PARTICLE AND COIL EMBOLIZATION  4.  ULTRASOUND GUIDANCE FOR ARTERIAL ACCESS  MEDICATIONS: Nitroglycerin 100 mcg IA  TECHNIQUE: Informed written consent was obtained from the patient after a discussion of the risks, benefits and alternatives to treatment. Questions regarding the procedure were encouraged and answered. A timeout was performed prior to the initiation of the procedure.  The right groin was prepped and draped in the  usual sterile fashion, and a sterile drape was applied covering the operative field. Maximum barrier sterile technique with sterile gowns and gloves were used for the procedure. A timeout was performed prior to the initiation of the procedure. Local anesthesia was provided with 1% lidocaine.  The right femoral head was marked  fluoroscopically. Under ultrasound guidance, the right common femoral artery was accessed with a micropuncture kit after the overlying soft tissues were anesthetized with 1% lidocaine. An ultrasound image was saved for documentation purposes. The micropuncture sheath was exchanged for a 5 [place] vascular sheath over a Bentson wire. A closure arteriogram was performed through the side of the sheath confirming access within the right common femoral artery.  Over a Bentson wire, a Mickelson catheter was advanced to the level of the thoracic aorta where it was back bled and flushed. The catheter was then utilized to select the celiac artery and a celiac arteriogram was performed. 100 mcg of nitroglycerin was not administered intra-arterially through the celiac arterial vascular distribution.  With the use of a Fathom 14 microwire, a regular Renegade microcatheter was advanced into the common hepatic artery and a common hepatic arteriogram was performed. The microcatheter was then advanced into the distal aspect of the gastroduodenal artery and a sub selective gastroduodenal arteriogram was performed.  Initially, the distal tributaries of the GDA were embolized with 700-900 micron Embospheres. Limited post particle embolization arteriogram was performed. Subsequently, the gastroduodenal artery was coil embolized to near its origin with a combination of overlapping 2 and 3 mm diameter interlock coils.  The micro catheter was retracted into the common hepatic artery and a repeat common hepatic arteriogram was performed. The micro catheter was removed and a post embolization celiac arteriogram was performed. All images were reviewed and the procedure was terminated. All wires, catheters and sheaths were removed from the patient. Hemostasis was achieved at the right groin access site with manual compression.  ANESTHESIA/SEDATION: Fentanyl 200 mcg IV; Versed 4 mg IV  60 minutes  CONTRAST:  50 cc Omnipaque 300  COMPARISON:   None.  FLUOROSCOPY TIME:  11 min, 42 seconds  COMPLICATIONS: None immediate  FINDINGS: Celiac arteriogram demonstrates persistent irregular narrowing of both the common and proper hepatic arteries extending preferentially involve the origin of the right hepatic artery.  The gastroduodenal artery remains patent though irregularly narrowed throughout its course.  The gastroduodenal artery was successfully cannulated and embolized with a combination of 700 to 900 micron Embospheres and overlapping interlock coils to near its origin. 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  IMPRESSION: 1. Successful prophylactic particle and coil embolization of the gastroduodenal artery secondary to recurrent and refractory upper GI (duodenal) bleed due to malignant invasion from patient's known pancreatic head carcinoma. 2. While the common and proper hepatic arteries remain irregularly narrowed throughout their course, I do not feel that these vessels are contributing to the patient's current hemodynamic instability and as such were not embolized.  PLAN: Pending the patient's wishes, would recommend continued aggressive conservative management. 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   Electronically Signed   By: Holland Commons.D.  On: 02/18/2013 13:58   Ct Angio Abd/pel W/ And/or W/o  02/23/2013   CLINICAL DATA:  GI bleed post embolization. Abdominal pain and distension.  EXAM: CT ANGIOGRAPHY ABDOMEN AND PELVIS  TECHNIQUE: Multidetector CT imaging of the abdomen and  pelvis was performed using the standard protocol during bolus administration of intravenous contrast. Multiplanar reconstructed images including MIPs were obtained and reviewed to evaluate the vascular anatomy.  CONTRAST:  OMNIPAQUE IOHEXOL 350 MG/ML SOLN  COMPARISON:  02/10/2013  FINDINGS: ARTERIAL FINDINGS:  Aorta: Minimal atheromatous plaque in its infrarenal segment. No aneurysm, dissection, or stenosis.  Celiac axis: Widely patent. Tortuous splenic artery. Attenuated left hepatic artery secondary to tumor encasement. Interval coil embolization of gastroduodenal artery.  Superior mesenteric: Patent. Replaced right hepatic arterial supply, an anatomic variant, with irregular narrowing of the proximal right hepatic artery secondary to tumor encasement.  Left renal:           Single, patent.  Right renal:          Single, patent.  Inferior mesenteric:  Patent  Left iliac:           Unremarkable  Right iliac:          Unremarkable  Venous findings: Infrarenal IVC filter with some trapped thrombus, without evidence of caval occlusion. Patent bilateral renal veins. There is tumor encasement of the portosplenic confluence with obstruction of the central aspect of the splenic vein, central superior mesenteric vein, and proximal portal vein. There has been some cavernous transformation of the portal vein with collateral flow maintaining patency of the distal main and right portal vein. Left portal vein is thrombosed. DVT noted in the right femoral vein, incompletely visualized through its distal aspect.  Review of the MIP images confirms the above findings.  Nonvascular findings: Platelike atelectasis posteriorly in the visualized lung bases. Small pleural effusions have developed. Progression of abdominal and pelvic ascites. There is no layering high attenuation component to suggest hemoperitoneum. Multiple low-attenuation hepatic lesions are again evident. There is a metallic stent through the common bile duct  with left pneumobilia implying stent patency. Pancreatic head mass encases the common duct and vasculature as detailed above. Wedge-shaped enhancement defects in the spleen as before. Stable 13 mm low-attenuation left adrenal nodule. Unremarkable right adrenal gland.  New small perfusion defects in both kidneys involving the upper pole on the left, lower pole on the right. These persist on delayed scans. There is however normal excretion of contrast into decompressed renal collecting systems and proximal ureters. The stomach, small bowel, and colon are nondilated. There is wall thickening suspected in multiple loops of nondistended mid and distal ileum, cecum and proximal ascending colon. Urinary bladder incompletely distended. Stable left para-aortic adenopathy. Stable mild superior endplate compression deformity of L3. Partially calcified disc protrusions L3-4, L4-5, L5-S1. Facet degenerative hypertrophy L3-S1, right worse than left.  IMPRESSION: 1. No evidence of retroperitoneal or intra-abdominal acute hemorrhage post embolization of the gastroduodenal artery. 2. Interval increase in abdominal ascites. 3. New small bilateral pleural effusions. 4. Wedge-shaped peripheral enhancement abnormalities in bilateral kidneys, new since previous study, may represent arterial emboli less likely multifocal pyelonephritis. 5. Progressive wall thickening in the distal jejunum and proximal ascending colon. Considerations include vascular insufficiency/ischemia versus colitis/enteritis. 6. Pancreatic mass with occlusion of the porto-splenic confluence and proximal main portal vein, encasement and narrowing of right and left hepatic arteries as before. 7. Right lower extremity DVT with nonocclusive thrombus noted in the infrarenal IVC filter. 8. 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   Electronically Signed   By: Oley Balm M.D.   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 CT angiography is performed to assess vasculature prior to potential arteriography and transcatheter embolization to treat persistent bleeding.  EXAM: CT ANGIOGRAPHY ABDOMEN AND PELVIS WITH CONTRAST AND WITHOUT CONTRAST  TECHNIQUE: Multidetector CT imaging of the abdomen and pelvis was performed using the standard protocol during bolus administration of intravenous contrast. Multiplanar reconstructed images including MIPs were obtained and reviewed to evaluate the vascular anatomy.  CONTRAST:  OMNIPAQUE IOHEXOL 350 MG/ML SOLN  COMPARISON:  12/23/2012  FINDINGS: There is significant enlargement of the mass involving the head of the pancreas since the prior CT in October. Dimensions of the mass are now approximately 3.7 x 5.6 x 4.5 cm. The mass visibly encases the pre-existing biliary stent and also extends further to encase the gastroduodenal artery, portal venous confluence, hepatic artery and a segment of the superior mesenteric vein. 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 Portal flow is present in the right lobe.  Significant progression of metastatic disease in the liver is also identified in both left and right lobes. Index lesion in the inferior right lobe shows enlargement with dimensions of 2.5 x 3.5 cm (1.6 x 2.1 cm previously). The largest left lobe lesion has enlarged from a maximum diameter of 2.4 cm previously to estimated current maximum diameter of 4.0 cm. All previously identified metastatic lesions have increased significantly in size. There are at least 4 new metastatic lesions identified in the liver. There is no evidence of associated biliary obstruction with air remaining a biliary tree. The common bile  duct stent lumen does not appear to be compromised.  There is new ascites with mild to moderate overall volume of ascites present as well as nodularity of the mesenteric and omentum suspicious for carcinomatosis. No bowel obstruction or perforation is identified.  Arterial vasculature evaluated by CTA shows no evidence of significant occlusive disease. The aorta and major visceral branches are normally patent. At the level of the celiac trunk, the common hepatic artery is narrowed and encased by tumor but remains open with flow noted in both right and left hepatic arteries. The gastroduodenal artery is open but encased by tumor. Small distal GDA branches are present supplying the pancreatic mass. There also appears to be pancreaticoduodenal supply from the SMA at the level of the pancreatic mass.  The superior mesenteric artery abuts tumor but is not visibly narrowed. Focal nonocclusive thrombus or direct tumor invasion is identified in the superior aspect of the superior mesenteric vein causing luminal stenosis of approximately 60%. The splenic vein remains open.  Bilateral renal artery show normal patency. The inferior mesenteric vein is open. Bilateral iliac and common femoral arteries show normal patency.  Appearance of the gallbladder and kidneys are unremarkable. The spleen shows a new focal wedge-shaped infarct in its posterior and inferior aspect. The rest of the spleen shows normal perfusion. There is a stable left adrenal nodule. Bony structures show degenerative changes of the spine without evidence of focal lesions.  Review of the MIP images confirms the above findings.  IMPRESSION: 1. Significant progression of metastatic pancreatic carcinoma with  enlargement of the mass at the level of the head of the pancreas and progression of hepatic metastatic disease. 2. 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 Tumor also encases and narrows the common hepatic artery, right and left hepatic arteries and gastroduodenal artery. There also is tumor involvement at the level of the superior mesenteric vein. 3. 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   Electronically Signed   By: Irish Lack M.D.   On: 02/11/2013 10:34    Medications: Scheduled Meds: . folic acid  2 mg Oral Daily  . lipase/protease/amylase  2 capsule Oral TID AC & HS  . pantoprazole  80 mg Oral Daily  . sodium chloride  3 mL Intravenous Q12H  . sucralfate  2 g Oral Custom      LOS: 9 days   Shandy Checo M.D. [facility] 02/24/2013, 12:23 PM Pager: [phone]  If 7PM-7AM, please contact night-coverage [email or web address] Password [email or web address]

## 2013-02-24 NOTE — Procedures (Signed)
Successful US guided paracentesis from RLQ.  Yielded 5.6L of clear yellow fluid.  No immediate complications.  Pt tolerated well.   Specimen was sent for labs.  Brayton El PA-C 02/24/2013 9:29 AM

## 2013-02-25 LAB — CBC
Hemoglobin: 8.9 g/dL — ABNORMAL LOW (ref 13.0–17.0)
MCH: 29.5 pg (ref 26.0–34.0)
MCHC: 32.7 g/dL (ref 30.0–36.0)
RDW: 16.2 % — ABNORMAL HIGH (ref 11.5–15.5)

## 2013-02-25 NOTE — Progress Notes (Signed)
Patient ID: Austin Escobar  male  ZOX:[id number]    DOB: 06-Apr-1955    DOA: 02/15/2013  PCP: [name], DO  Assessment/Plan:   Acute blood loss anemia  -Has received 9 units PRBCs during this admission; 2units FFP; 1 unit of platelets.  -Underwent embolization 12/18 of the GDA by IR 12/18.  -appreciate GI and IR followup  -Avoid NSAIDs  -Hemoglobin has remained fairly stable. Patient is hemodynamically stable , hemoglobin 8.9 today -Tolerating a regular diet -origin of bleed likely from duodenal bulb where there is tumor invasion  -CT abdomen showed no evidence of retroperitoneal or intra-abdominal acute hemorrhage post embolization of the gastroduodenal artery, interval increase in ascites  GI bleed  -As above.   Ascites - Ultrasound guided paracentesis 12/24 with 5.6 L out -Dr. [name] has scheduled another paracentesis for tomorrow.  Coagulopathy  -Received 2 units FFP 02/16/2013 when his INR was 1.7  -vitamin K given 12/18   Thrombocytopenia  -Received 1 unit of platelets on 12/21.   Esophageal varices  -No bleeding noted from previous endoscopy  -Continue Protonix and Carafate   Saddle pulmonary embolus  -12/23/2012  -Status post IVC filter in December 2014  -Currently stable without any respiratory distress or hypoxemia   Atrial fibrillation  -Status post ablation 05/25/2012  -Currently in sinus rhythm  -Not a candidate for anticoagulation   Metastatic pancreatic adenocarcinoma with duodenal invasion  -Appreciate oncology followup  -continue radiation therapy.  -Dr. [name] is following   HTN  -Metoprolol tartrate is on hold due to soft blood pressures   dVT Prophylaxis:  Code Status:-LIMITED CODE--NO CPR, NO SHOCK, NO INTUBATION, YES VASOPRESSOR MEDS   Family Communication:Discussed with the patient   Disposition: Likely home in 24-48 hours.    Subjective: Feels better after paracentesis. No true complaints.  Objective: Weight change:    Intake/Output Summary (Last 24 hours) at 02/25/13 1653 Last data filed at 02/25/13 1400  Gross per 24 hour  Intake    680 ml  Output      0 ml  Net    680 ml   Blood pressure 126/74, pulse 93, temperature 98.6 F (37 C), temperature source Oral, resp. rate 18, height 6\' 1"  (1.854 m), weight 99.066 kg (218 lb 6.4 oz), SpO2 100.00%.  Physical Exam: General: Alert and awake, oriented x3, not in any acute distress. CVS: S1-S2 clear, no murmur rubs or gallops Chest: clear to auscultation bilaterally, no wheezing, rales or rhonchi Abdomen: soft nontender, nondistended, normal bowel sounds  Extremities: no cyanosis, clubbing. 1+ edema noted bilaterally Neuro: Cranial nerves II-XII intact, no focal neurological deficits  Lab Results: Basic Metabolic Panel:  Recent Labs Lab 02/22/13 0925  NA 137  K 3.2*  CL 108  CO2 20  GLUCOSE 175*  BUN 9  CREATININE 0.83  CALCIUM 8.4   Liver Function Tests:  Recent Labs Lab 02/22/13 0925  AST 28  ALT 24  ALKPHOS 88  BILITOT 0.5  PROT 5.4*  ALBUMIN 2.2*   No results found for this basename: LIPASE, AMYLASE,  in the last 168 hours No results found for this basename: AMMONIA,  in the last 168 hours CBC:  Recent Labs Lab 02/24/13 0520 02/25/13 0540  WBC 8.2 6.9  HGB 9.2* 8.9*  HCT 27.1* 27.2*  MCV 90.0 90.1  PLT 66* 66*   Cardiac Enzymes: No results found for this basename: CKTOTAL, CKMB, CKMBINDEX, TROPONINI,  in the last 168 hours BNP: No components found with this basename: POCBNP,  CBG: No results found for this basename: GLUCAP,  in the last 168 hours   Micro Results: No results found for this or any previous visit (from the past 240 hour(s)).  Studies/Results: Ir Angiogram Visceral Selective  02/18/2013   INDICATION: History of metastatic pancreatic cancer, now with acute and recurrent upper GI bleeding from the duodenum based on recent endoscopy. Post failed attempted endoscopic duodenal stent placement and  mesenteric arteriogram  EXAM: 1.  CELIAC ARTERIOGRAM  2.  COMMON HEPATIC ARTERIOGRAM (3RD ORDER).  3. SUB SELECTIVE GASTRODUODENAL ARTERIOGRAM (3RD ORDER) WITH PARTICLE AND COIL EMBOLIZATION  4.  ULTRASOUND GUIDANCE FOR ARTERIAL ACCESS  MEDICATIONS: Nitroglycerin 100 mcg IA  TECHNIQUE: Informed written consent was obtained from the patient after a discussion of the risks, benefits and alternatives to treatment. Questions regarding the procedure were encouraged and answered. A timeout was performed prior to the initiation of the procedure.  The right groin was prepped and draped in the usual sterile fashion, and a sterile drape was applied covering the operative field. Maximum barrier sterile technique with sterile gowns and gloves were used for the procedure. A timeout was performed prior to the initiation of the procedure. Local anesthesia was provided with 1% lidocaine.  The right femoral head was marked fluoroscopically. Under ultrasound guidance, the right common femoral artery was accessed with a micropuncture kit after the overlying soft tissues were anesthetized with 1% lidocaine. An ultrasound image was saved for documentation purposes. The micropuncture sheath was exchanged for a 5 [place] vascular sheath over a Bentson wire. A closure arteriogram was performed through the side of the sheath confirming access within the right common femoral artery.  Over a Bentson wire, a Mickelson catheter was advanced to the level of the thoracic aorta where it was back bled and flushed. The catheter was then utilized to select the celiac artery and a celiac arteriogram was performed. 100 mcg of nitroglycerin was not administered intra-arterially through the celiac arterial vascular distribution.  With the use of a Fathom 14 microwire, a regular Renegade microcatheter was advanced into the common hepatic artery and a common hepatic arteriogram was performed. The microcatheter was then advanced into the distal aspect of the  gastroduodenal artery and a sub selective gastroduodenal arteriogram was performed.  Initially, the distal tributaries of the GDA were embolized with 700-900 micron Embospheres. Limited post particle embolization arteriogram was performed. Subsequently, the gastroduodenal artery was coil embolized to near its origin with a combination of overlapping 2 and 3 mm diameter interlock coils.  The micro catheter was retracted into the common hepatic artery and a repeat common hepatic arteriogram was performed. The micro catheter was removed and a post embolization celiac arteriogram was performed. All images were reviewed and the procedure was terminated. All wires, catheters and sheaths were removed from the patient. Hemostasis was achieved at the right groin access site with manual compression.  ANESTHESIA/SEDATION: Fentanyl 200 mcg IV; Versed 4 mg IV  60 minutes  CONTRAST:  50 cc Omnipaque 300  COMPARISON:  None.  FLUOROSCOPY TIME:  11 min, 42 seconds  COMPLICATIONS: None immediate  FINDINGS: Celiac arteriogram demonstrates persistent irregular narrowing of both the common and proper hepatic arteries extending preferentially involve the origin of the right hepatic artery.  The gastroduodenal artery remains patent though irregularly narrowed throughout its course.  The gastroduodenal artery was successfully cannulated and embolized with a combination of 700 to 900 micron Embospheres and overlapping interlock coils to near its origin. Post embolization arteriogram demonstrates complete occlusion  of the gastroduodenal artery.  IMPRESSION: 1. Successful prophylactic particle and coil embolization of the gastroduodenal artery secondary to recurrent and refractory upper GI (duodenal) bleed due to malignant invasion from patient's known pancreatic head carcinoma. 2. While the common and proper hepatic arteries remain irregularly narrowed throughout their course, I do not feel that these vessels are contributing to the  patient's current hemodynamic instability and as such were not embolized.  PLAN: Pending the patient's wishes, would recommend continued aggressive conservative management. 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   Electronically Signed   By: Simonne Come M.D.   On: 02/18/2013 13:58   Ir Angiogram Visceral Selective  02/18/2013   CLINICAL DATA:  57 year old with metastatic pancreatic cancer. Acute and recurrent upper GI bleeding. The patient has had previous duodenal bleeding based on endoscopy.  EXAM: VISCERAL ARTERIOGRAPHY ; SELECTIVE VISCERAL ARTERIOGRAPHY ; ULTRASOUND GUIDANCE FOR VASCULAR ACCESS  Physician: Rachelle Hora. Henn, MD  MEDICATIONS: Versed 5 mg, fentanyl 100 mcg. A radiology nurse monitored the patient for moderate sedation.  ANESTHESIA/SEDATION: Moderate sedation time: 1 hr and 45 min  CONTRAST:  85 mL Omnipaque 300  FLUOROSCOPY TIME:  29 min and 30 seconds  PROCEDURE: The procedure was explained to the patient. The risks and benefits of the procedure were discussed and the patient's questions were addressed. Informed consent was obtained from the patient. Patient was brought to the interventional suite. The right groin was prepped and draped in sterile fashion. Maximal barrier sterile technique was utilized including caps, mask, sterile gowns, sterile gloves, sterile drape, hand hygiene and skin antiseptic. The skin was anesthetized with 1% lidocaine. Using ultrasound guidance, a 21 gauge needle was directed into the right common femoral artery and a micropuncture dilator set was placed. A  5 French vascular sheath was placed over a Bentson wire. The superior mesenteric artery was cannulated with a C2 catheter. Superior mesenteric arteriogram was performed. The C2 catheter was used to cannulate the celiac trunk. Celiac arteriography was performed. A Glidewire was advanced into the common hepatic artery and the C2 catheter was advanced into the proximal common hepatic artery. At this point, there appeared to be extensive spasm in the common hepatic artery. C2 catheter was pulled back to the celiac trunk origin and additional angiography was performed. Multiple attempts were made to cannulate the common hepatic artery with a Renegade micro catheter. Cannulating the common hepatic artery was very difficult with the micro catheter. As result, the C2 catheter was exchanged for a Sos catheter. Eventually, a glide micro wire was advanced into the common hepatic artery and proper hepatic artery. The Renegade catheter was advanced to the common hepatic artery and additional angiography was performed. Catheter was also advanced into the proper hepatic artery and additional angiography was performed. Multiple attempts were made to cannulate the gastroduodenal artery with a wire and catheter. These attempts at cannulating the gastroduodenal artery were unsuccessful. The micro catheter and 5 French catheter were removed. Final angiogram was performed through the right groin sheath. The groin sheath was removed with an Exoseal closure device. There was groin hemostasis at the end of the procedure.  COMPLICATIONS: None  FINDINGS: Superior mesenteric arteriogram: Patient has a metallic biliary stent and IVC filter. Patient has a large replaced right hepatic artery that originates near the origin of the superior mesenteric artery. There are small pancreatic-duodenal branches but no evidence for a vascular malformation or contrast extravasation. The delayed images demonstrate patency of the superior mesenteric veins but  there is marked attenuation and narrowing of the portal confluence and main portal vein. The intrahepatic portal venous system appears to be patent.  Celiac arteriography: The splenic artery is large and patent. There is a prominent left gastric artery with filling of the right gastric artery. There is mild narrowing and irregularity at the common hepatic artery and proper hepatic artery. There appears to be irregularity of the gastroduodenal artery. There is narrowing involving the origin of the right hepatic artery. There is no evidence for acute extravasation. After a wire was advanced into the common hepatic artery, there was marked narrowing of the common hepatic artery suggestive for vasospasm and minimal flow into the gastroduodenal artery. At one point, there appeared to be retrograde flow into the gastroduodenal artery. After the vasospasm resolved, the flow to the common hepatic artery and gastroduodenal artery returned to baseline.  IMPRESSION: Irregularity of the common hepatic artery, gastroduodenal artery and proper hepatic artery. These findings are related to the surrounding tumor burden. There was extensive vasospasm in the common hepatic artery during this procedure but no evidence for acute bleeding or contrast extravasation. The gastroduodenal artery could never be successfully cannulated and, therefore, an empiric gastroduodenal embolization could not be performed.  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   Electronically Signed   By: Richarda Overlie M.D.   On: 02/18/2013 10:29   Ir Angiogram Visceral Selective  02/18/2013   CLINICAL DATA:  57 year old with metastatic pancreatic cancer. Acute and recurrent upper GI bleeding. The patient has had previous duodenal bleeding based on endoscopy.  EXAM: VISCERAL ARTERIOGRAPHY ; SELECTIVE VISCERAL ARTERIOGRAPHY ; ULTRASOUND GUIDANCE FOR VASCULAR ACCESS  Physician: Rachelle Hora. Henn, MD  MEDICATIONS: Versed 5 mg, fentanyl  100 mcg. A radiology nurse monitored the patient for moderate sedation.  ANESTHESIA/SEDATION: Moderate sedation time: 1 hr and 45 min  CONTRAST:  85 mL Omnipaque 300  FLUOROSCOPY TIME:  29 min and 30 seconds  PROCEDURE: The procedure was explained to the patient. The risks and benefits of the procedure were discussed and the patient's questions were addressed. Informed consent was obtained from the patient. Patient was brought to the interventional suite. The right groin was prepped and draped in sterile fashion. Maximal barrier sterile technique was utilized including caps, mask, sterile gowns, sterile gloves, sterile drape, hand hygiene and skin antiseptic. The skin was anesthetized with 1% lidocaine. Using ultrasound guidance, a 21 gauge needle was directed into the right common femoral artery and a micropuncture dilator set was placed. A 5 French vascular sheath was placed over a Bentson wire. The superior mesenteric artery was cannulated with a C2 catheter. Superior mesenteric arteriogram was performed. The C2 catheter was used to cannulate the celiac trunk. Celiac arteriography was performed. A Glidewire was advanced into the common hepatic artery and the C2 catheter was advanced into the proximal common hepatic artery. At this point, there appeared to be extensive spasm in the common hepatic artery. C2 catheter was pulled back to the celiac trunk origin and additional angiography was performed. Multiple attempts were made to cannulate the common hepatic artery with a Renegade micro catheter.  Cannulating the common hepatic artery was very difficult with the micro catheter. As result, the C2 catheter was exchanged for a Sos catheter. Eventually, a glide micro wire was advanced into the common hepatic artery and proper hepatic artery. The Renegade catheter was advanced to the common hepatic artery and additional angiography was performed. Catheter was also advanced into the proper hepatic artery and additional  angiography was performed. Multiple attempts were made to cannulate the gastroduodenal artery with a wire and catheter. These attempts at cannulating the gastroduodenal artery were unsuccessful. The micro catheter and 5 French catheter were removed. Final angiogram was performed through the right groin sheath. The groin sheath was removed with an Exoseal closure device. There was groin hemostasis at the end of the procedure.  COMPLICATIONS: None  FINDINGS: Superior mesenteric arteriogram: Patient has a metallic biliary stent and IVC filter. Patient has a large replaced right hepatic artery that originates near the origin of the superior mesenteric artery. There are small pancreatic-duodenal branches but no evidence for a vascular malformation or contrast extravasation. The delayed images demonstrate patency of the superior mesenteric veins but there is marked attenuation and narrowing of the portal confluence and main portal vein. The intrahepatic portal venous system appears to be patent.  Celiac arteriography: The splenic artery is large and patent. There is a prominent left gastric artery with filling of the right gastric artery. There is mild narrowing and irregularity at the common hepatic artery and proper hepatic artery. There appears to be irregularity of the gastroduodenal artery. There is narrowing involving the origin of the right hepatic artery. There is no evidence for acute extravasation. After a wire was advanced into the common hepatic artery, there was marked narrowing of the common hepatic artery suggestive for vasospasm and minimal flow into the gastroduodenal artery. At one point, there appeared to be retrograde flow into the gastroduodenal artery. After the vasospasm resolved, the flow to the common hepatic artery and gastroduodenal artery returned to baseline.  IMPRESSION: Irregularity of the common hepatic artery, gastroduodenal artery and proper hepatic artery. These findings are related to  the surrounding tumor burden. There was extensive vasospasm in the common hepatic artery during this procedure but no evidence for acute bleeding or contrast extravasation. The gastroduodenal artery could never be successfully cannulated and, therefore, an empiric gastroduodenal embolization could not be performed.  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   Electronically Signed   By: Richarda Overlie M.D.   On: 02/18/2013 10:29   Ir Angiogram Selective Each Additional Vessel  02/18/2013   INDICATION: History of metastatic pancreatic cancer, now with acute and recurrent upper GI bleeding from the duodenum based on recent endoscopy. Post failed attempted endoscopic duodenal stent placement and mesenteric arteriogram  EXAM: 1.  CELIAC ARTERIOGRAM  2.  COMMON HEPATIC ARTERIOGRAM (3RD ORDER).  3. SUB SELECTIVE GASTRODUODENAL ARTERIOGRAM (3RD ORDER) WITH PARTICLE AND COIL EMBOLIZATION  4.  ULTRASOUND GUIDANCE FOR ARTERIAL ACCESS  MEDICATIONS: Nitroglycerin 100 mcg IA  TECHNIQUE: Informed written consent was obtained from the patient after a discussion of the risks, benefits and alternatives to treatment. Questions regarding the procedure were encouraged and answered. A timeout was performed prior to the initiation of the procedure.  The right groin was prepped and draped in the usual sterile fashion, and a sterile drape was applied covering the operative field. Maximum barrier sterile technique with sterile gowns and gloves were used for the procedure. A timeout was performed prior to the initiation  of the procedure. Local anesthesia was provided with 1% lidocaine.  The right femoral head was marked fluoroscopically. Under ultrasound guidance, the right common femoral artery was accessed with a micropuncture kit after the overlying soft tissues were anesthetized with 1% lidocaine. An ultrasound image was saved for documentation purposes. The micropuncture sheath was exchanged for a 5  [place] vascular sheath over a Bentson wire. A closure arteriogram was performed through the side of the sheath confirming access within the right common femoral artery.  Over a Bentson wire, a Mickelson catheter was advanced to the level of the thoracic aorta where it was back bled and flushed. The catheter was then utilized to select the celiac artery and a celiac arteriogram was performed. 100 mcg of nitroglycerin was not administered intra-arterially through the celiac arterial vascular distribution.  With the use of a Fathom 14 microwire, a regular Renegade microcatheter was advanced into the common hepatic artery and a common hepatic arteriogram was performed. The microcatheter was then advanced into the distal aspect of the gastroduodenal artery and a sub selective gastroduodenal arteriogram was performed.  Initially, the distal tributaries of the GDA were embolized with 700-900 micron Embospheres. Limited post particle embolization arteriogram was performed. Subsequently, the gastroduodenal artery was coil embolized to near its origin with a combination of overlapping 2 and 3 mm diameter interlock coils.  The micro catheter was retracted into the common hepatic artery and a repeat common hepatic arteriogram was performed. The micro catheter was removed and a post embolization celiac arteriogram was performed. All images were reviewed and the procedure was terminated. All wires, catheters and sheaths were removed from the patient. Hemostasis was achieved at the right groin access site with manual compression.  ANESTHESIA/SEDATION: Fentanyl 200 mcg IV; Versed 4 mg IV  60 minutes  CONTRAST:  50 cc Omnipaque 300  COMPARISON:  None.  FLUOROSCOPY TIME:  11 min, 42 seconds  COMPLICATIONS: None immediate  FINDINGS: Celiac arteriogram demonstrates persistent irregular narrowing of both the common and proper hepatic arteries extending preferentially involve the origin of the right hepatic artery.  The gastroduodenal  artery remains patent though irregularly narrowed throughout its course.  The gastroduodenal artery was successfully cannulated and embolized with a combination of 700 to 900 micron Embospheres and overlapping interlock coils to near its origin. 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  IMPRESSION: 1. Successful prophylactic particle and coil embolization of the gastroduodenal artery secondary to recurrent and refractory upper GI (duodenal) bleed due to malignant invasion from patient's known pancreatic head carcinoma. 2. While the common and proper hepatic arteries remain irregularly narrowed throughout their course, I do not feel that these vessels are contributing to the patient's current hemodynamic instability and as such were not embolized.  PLAN: Pending the patient's wishes, would recommend continued aggressive conservative management. 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   Electronically Signed   By: Simonne Come M.D.   On: 02/18/2013 13:58   Ir Angiogram Selective Each Additional Vessel  02/18/2013   CLINICAL DATA:  57 year old with metastatic pancreatic cancer. Acute and recurrent upper GI bleeding. The patient has had  previous duodenal bleeding based on endoscopy.  EXAM: VISCERAL ARTERIOGRAPHY ; SELECTIVE VISCERAL ARTERIOGRAPHY ; ULTRASOUND GUIDANCE FOR VASCULAR ACCESS  Physician: Rachelle Hora. Henn, MD  MEDICATIONS: Versed 5 mg, fentanyl 100 mcg. A radiology nurse monitored the patient for moderate sedation.  ANESTHESIA/SEDATION: Moderate  sedation time: 1 hr and 45 min  CONTRAST:  85 mL Omnipaque 300  FLUOROSCOPY TIME:  29 min and 30 seconds  PROCEDURE: The procedure was explained to the patient. The risks and benefits of the procedure were discussed and the patient's questions were addressed. Informed consent was obtained from the patient. Patient was brought to the interventional suite. The right groin was prepped and draped in sterile fashion. Maximal barrier sterile technique was utilized including caps, mask, sterile gowns, sterile gloves, sterile drape, hand hygiene and skin antiseptic. The skin was anesthetized with 1% lidocaine. Using ultrasound guidance, a 21 gauge needle was directed into the right common femoral artery and a micropuncture dilator set was placed. A 5 French vascular sheath was placed over a Bentson wire. The superior mesenteric artery was cannulated with a C2 catheter. Superior mesenteric arteriogram was performed. The C2 catheter was used to cannulate the celiac trunk. Celiac arteriography was performed. A Glidewire was advanced into the common hepatic artery and the C2 catheter was advanced into the proximal common hepatic artery. At this point, there appeared to be extensive spasm in the common hepatic artery. C2 catheter was pulled back to the celiac trunk origin and additional angiography was performed. Multiple attempts were made to cannulate the common hepatic artery with a Renegade micro catheter. Cannulating the common hepatic artery was very difficult with the micro catheter. As result, the C2 catheter was exchanged for a Sos catheter. Eventually, a glide micro wire was advanced into the common hepatic artery and proper hepatic artery. The Renegade catheter was advanced to the common hepatic artery and additional angiography was performed. Catheter was also advanced into the proper hepatic artery and additional angiography was performed. Multiple attempts were made to cannulate the gastroduodenal artery with a  wire and catheter. These attempts at cannulating the gastroduodenal artery were unsuccessful. The micro catheter and 5 French catheter were removed. Final angiogram was performed through the right groin sheath. The groin sheath was removed with an Exoseal closure device. There was groin hemostasis at the end of the procedure.  COMPLICATIONS: None  FINDINGS: Superior mesenteric arteriogram: Patient has a metallic biliary stent and IVC filter. Patient has a large replaced right hepatic artery that originates near the origin of the superior mesenteric artery. There are small pancreatic-duodenal branches but no evidence for a vascular malformation or contrast extravasation. The delayed images demonstrate patency of the superior mesenteric veins but there is marked attenuation and narrowing of the portal confluence and main portal vein. The intrahepatic portal venous system appears to be patent.  Celiac arteriography: The splenic artery is large and patent. There is a prominent left gastric artery with filling of the right gastric artery. There is mild narrowing and irregularity at the common hepatic artery and proper hepatic artery. There appears to be irregularity of the gastroduodenal artery. There is narrowing involving the origin of the right hepatic artery. There is no evidence for acute extravasation. After a wire was advanced into the common hepatic artery, there was marked narrowing of the common hepatic artery suggestive for vasospasm and minimal flow into the gastroduodenal artery. At one point, there appeared to be retrograde flow into the gastroduodenal artery. After the vasospasm resolved, the flow  to the common hepatic artery and gastroduodenal artery returned to baseline.  IMPRESSION: Irregularity of the common hepatic artery, gastroduodenal artery and proper hepatic artery. These findings are related to the surrounding tumor burden. There was extensive vasospasm in the common hepatic artery during this  procedure but no evidence for acute bleeding or contrast extravasation. The gastroduodenal artery could never be successfully cannulated and, therefore, an empiric gastroduodenal embolization could not be performed.  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   Electronically Signed   By: Richarda Overlie M.D.   On: 02/18/2013 10:29   Ir Angiogram Selective Each Additional Vessel  02/18/2013   CLINICAL DATA:  57 year old with metastatic pancreatic cancer. Acute and recurrent upper GI bleeding. The patient has had previous duodenal bleeding based on endoscopy.  EXAM: VISCERAL ARTERIOGRAPHY ; SELECTIVE VISCERAL ARTERIOGRAPHY ; ULTRASOUND GUIDANCE FOR VASCULAR ACCESS  Physician: Rachelle Hora. Henn, MD  MEDICATIONS: Versed 5 mg, fentanyl 100 mcg. A radiology nurse monitored the patient for moderate sedation.  ANESTHESIA/SEDATION: Moderate sedation time: 1 hr and 45 min  CONTRAST:  85 mL Omnipaque 300  FLUOROSCOPY TIME:  29 min and 30 seconds  PROCEDURE: The procedure was explained to the patient. The risks and benefits of the procedure were discussed and the patient's questions were addressed. Informed consent was obtained from the patient. Patient was brought to the interventional suite. The right groin was prepped and draped in sterile fashion. Maximal barrier sterile technique was utilized including caps, mask, sterile gowns, sterile gloves, sterile drape, hand hygiene and skin antiseptic. The skin was anesthetized with 1% lidocaine. Using ultrasound guidance, a 21 gauge needle was directed into the right common femoral artery and a micropuncture dilator set was placed. A 5 French vascular sheath was placed over a Bentson wire. The superior mesenteric artery was cannulated with a C2 catheter. Superior mesenteric arteriogram was performed. The C2 catheter was used to cannulate the celiac trunk. Celiac arteriography was performed. A Glidewire was advanced into the common hepatic artery and  the C2 catheter was advanced into the proximal common hepatic artery. At this point, there appeared to be extensive spasm in the common hepatic artery. C2 catheter was pulled back to the celiac trunk origin and additional angiography was performed. Multiple attempts were made to cannulate the common hepatic artery with a Renegade micro catheter. Cannulating the common hepatic artery was very difficult with the micro catheter. As result, the C2 catheter was exchanged for a Sos catheter. Eventually, a glide micro wire was advanced into the common hepatic artery and proper hepatic artery. The Renegade catheter was advanced to the common hepatic artery and additional angiography was performed. Catheter was also advanced into the proper hepatic artery and additional angiography was performed. Multiple attempts were made to cannulate the gastroduodenal artery with a wire and catheter. These attempts at cannulating the gastroduodenal artery were unsuccessful. The micro catheter and 5 French catheter were removed. Final angiogram was performed through the right groin sheath. The groin sheath was removed with an Exoseal closure device. There was groin hemostasis at the end of the procedure.  COMPLICATIONS: None  FINDINGS: Superior mesenteric arteriogram: Patient has a metallic biliary stent and IVC filter. Patient has a large replaced right hepatic artery that originates near the origin of the superior mesenteric artery. There are small pancreatic-duodenal branches but no evidence for a vascular malformation or contrast extravasation. The delayed images demonstrate patency of the superior mesenteric veins but there is marked attenuation and  narrowing of the portal confluence and main portal vein. The intrahepatic portal venous system appears to be patent.  Celiac arteriography: The splenic artery is large and patent. There is a prominent left gastric artery with filling of the right gastric artery. There is mild narrowing  and irregularity at the common hepatic artery and proper hepatic artery. There appears to be irregularity of the gastroduodenal artery. There is narrowing involving the origin of the right hepatic artery. There is no evidence for acute extravasation. After a wire was advanced into the common hepatic artery, there was marked narrowing of the common hepatic artery suggestive for vasospasm and minimal flow into the gastroduodenal artery. At one point, there appeared to be retrograde flow into the gastroduodenal artery. After the vasospasm resolved, the flow to the common hepatic artery and gastroduodenal artery returned to baseline.  IMPRESSION: Irregularity of the common hepatic artery, gastroduodenal artery and proper hepatic artery. These findings are related to the surrounding tumor burden. There was extensive vasospasm in the common hepatic artery during this procedure but no evidence for acute bleeding or contrast extravasation. The gastroduodenal artery could never be successfully cannulated and, therefore, an empiric gastroduodenal embolization could not be performed.  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   Electronically Signed   By: Richarda Overlie M.D.   On: 02/18/2013 10:29   Ir Ivc Filter Plmt / S&i /img Guid/mod Sed  02/11/2013   CLINICAL DATA:  Metastatic pancreatic carcinoma. History of pulmonary embolism. GI bleeding, a relative contraindication to anticoagulation.  EXAM: INFERIOR VENACAVOGRAM  IVC FILTER PLACEMENT UNDER FLUOROSCOPY  TECHNIQUE: The procedure, risks (including but not limited to bleeding, infection, organ damage ), benefits, and alternatives were explained to the patient. Questions regarding the procedure were encouraged and answered. The patient understands and consents to the procedure. Caval anatomy reviewed on previous CT abdomen. Patency of the right IJ vein was confirmed with ultrasound with image documentation. An appropriate skin site was  determined. Skin site was marked, prepped with chlorhexidine, and draped using maximum barrier technique. The region was infiltrated locally with 1% lidocaine.  Intravenous Fentanyl and Versed were administered as conscious sedation during continuous cardiorespiratory monitoring by the radiology RN, with a total moderate sedation time of less than 30 minutes.  Under real-time ultrasound guidance, the right IJ vein was accessed with a 21 gauge micropuncture needle; the needle tip within the vein was confirmed with ultrasound image documentation. The needle was exchanged over a 018 guidewire for a transitional dilator, which allow advancement of the [facility] wire into the IVC. A long 6 French vascular sheath was placed for inferior venacavography. This demonstrated no caval thrombus. Renal vein inflows were evident.  The [facility] IVC filter was advanced through the sheath and successfully deployed under fluoroscopy at the L2-3 disc level. Followup cavagram demonstrates stable filter position and no evident complication. The sheath was removed and hemostasis achieved at the site. No immediate complication.  FLUOROSCOPY TIME:  36 seconds  IMPRESSION: 1. Normal IVC. No thrombus or significant anatomic variation. 2. Technically successful infrarenal IVC filter placement. This is a retrievable model.   Electronically Signed   By: Oley Balm M.D.   On: 02/11/2013 13:43   US Paracentesis  02/24/2013   CLINICAL DATA:  Abdominal distention, ascites. Request diagnostic and therapeutic paracentesis.  EXAM: ULTRASOUND GUIDED PARACENTESIS  COMPARISON:  None  PROCEDURE: An ultrasound guided paracentesis was thoroughly discussed with the patient and questions answered. The benefits, risks, alternatives  and complications were also discussed. The patient understands and wishes to proceed with the procedure. Written consent was obtained.  Ultrasound was performed to localize and mark an adequate pocket of fluid in the right lower  quadrant of the abdomen. The area was then prepped and draped in the normal sterile fashion. 1% Lidocaine was used for local anesthesia. Under ultrasound guidance a 19 gauge Yueh catheter was introduced. Paracentesis was performed. The catheter was removed and a dressing applied.  COMPLICATIONS: None immediate  FINDINGS: A total of approximately 5.6 L of clear yellow fluid was removed. A fluid sample was sent for laboratory analysis.  IMPRESSION: Successful ultrasound guided paracentesis yielding 5.6 L of ascites.  Read by: Brayton El PA-C   Electronically Signed   By: Maryclare Bean M.D.   On: 02/24/2013 09:29   Ir US Guide Vasc Access Right  02/18/2013   INDICATION: History of metastatic pancreatic cancer, now with acute and recurrent upper GI bleeding from the duodenum based on recent endoscopy. Post failed attempted endoscopic duodenal stent placement and mesenteric arteriogram  EXAM: 1.  CELIAC ARTERIOGRAM  2.  COMMON HEPATIC ARTERIOGRAM (3RD ORDER).  3. SUB SELECTIVE GASTRODUODENAL ARTERIOGRAM (3RD ORDER) WITH PARTICLE AND COIL EMBOLIZATION  4.  ULTRASOUND GUIDANCE FOR ARTERIAL ACCESS  MEDICATIONS: Nitroglycerin 100 mcg IA  TECHNIQUE: Informed written consent was obtained from the patient after a discussion of the risks, benefits and alternatives to treatment. Questions regarding the procedure were encouraged and answered. A timeout was performed prior to the initiation of the procedure.  The right groin was prepped and draped in the usual sterile fashion, and a sterile drape was applied covering the operative field. Maximum barrier sterile technique with sterile gowns and gloves were used for the procedure. A timeout was performed prior to the initiation of the procedure. Local anesthesia was provided with 1% lidocaine.  The right femoral head was marked fluoroscopically. Under ultrasound guidance, the right common femoral artery was accessed with a micropuncture kit after the overlying soft tissues were  anesthetized with 1% lidocaine. An ultrasound image was saved for documentation purposes. The micropuncture sheath was exchanged for a 5 [place] vascular sheath over a Bentson wire. A closure arteriogram was performed through the side of the sheath confirming access within the right common femoral artery.  Over a Bentson wire, a Mickelson catheter was advanced to the level of the thoracic aorta where it was back bled and flushed. The catheter was then utilized to select the celiac artery and a celiac arteriogram was performed. 100 mcg of nitroglycerin was not administered intra-arterially through the celiac arterial vascular distribution.  With the use of a Fathom 14 microwire, a regular Renegade microcatheter was advanced into the common hepatic artery and a common hepatic arteriogram was performed. The microcatheter was then advanced into the distal aspect of the gastroduodenal artery and a sub selective gastroduodenal arteriogram was performed.  Initially, the distal tributaries of the GDA were embolized with 700-900 micron Embospheres. Limited post particle embolization arteriogram was performed. Subsequently, the gastroduodenal artery was coil embolized to near its origin with a combination of overlapping 2 and 3 mm diameter interlock coils.  The micro catheter was retracted into the common hepatic artery and a repeat common hepatic arteriogram was performed. The micro catheter was removed and a post embolization celiac arteriogram was performed. All images were reviewed and the procedure was terminated. All wires, catheters and sheaths were removed from the patient. Hemostasis was achieved at the right groin access site  with manual compression.  ANESTHESIA/SEDATION: Fentanyl 200 mcg IV; Versed 4 mg IV  60 minutes  CONTRAST:  50 cc Omnipaque 300  COMPARISON:  None.  FLUOROSCOPY TIME:  11 min, 42 seconds  COMPLICATIONS: None immediate  FINDINGS: Celiac arteriogram demonstrates persistent irregular narrowing of  both the common and proper hepatic arteries extending preferentially involve the origin of the right hepatic artery.  The gastroduodenal artery remains patent though irregularly narrowed throughout its course.  The gastroduodenal artery was successfully cannulated and embolized with a combination of 700 to 900 micron Embospheres and overlapping interlock coils to near its origin. 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  IMPRESSION: 1. Successful prophylactic particle and coil embolization of the gastroduodenal artery secondary to recurrent and refractory upper GI (duodenal) bleed due to malignant invasion from patient's known pancreatic head carcinoma. 2. While the common and proper hepatic arteries remain irregularly narrowed throughout their course, I do not feel that these vessels are contributing to the patient's current hemodynamic instability and as such were not embolized.  PLAN: Pending the patient's wishes, would recommend continued aggressive conservative management. 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   Electronically Signed   By: Simonne Come M.D.   On: 02/18/2013 13:58   Ir US Guide Vasc Access Right  02/18/2013   CLINICAL DATA:  57 year old with metastatic pancreatic cancer. Acute and recurrent upper GI bleeding. The patient has had previous duodenal bleeding based on endoscopy.  EXAM: VISCERAL ARTERIOGRAPHY ; SELECTIVE VISCERAL ARTERIOGRAPHY ; ULTRASOUND GUIDANCE FOR VASCULAR ACCESS  Physician: Rachelle Hora. Henn, MD  MEDICATIONS: Versed  5 mg, fentanyl 100 mcg. A radiology nurse monitored the patient for moderate sedation.  ANESTHESIA/SEDATION: Moderate sedation time: 1 hr and 45 min  CONTRAST:  85 mL Omnipaque 300  FLUOROSCOPY TIME:  29 min and 30 seconds  PROCEDURE: The procedure was explained to the patient. The risks and benefits of the procedure were discussed and the patient's questions were addressed. Informed consent was obtained from the patient. Patient was brought to the interventional suite. The right groin was prepped and draped in sterile fashion. Maximal barrier sterile technique was utilized including caps, mask, sterile gowns, sterile gloves, sterile drape, hand hygiene and skin antiseptic. The skin was anesthetized with 1% lidocaine. Using ultrasound guidance, a 21 gauge needle was directed into the right common femoral artery and a micropuncture dilator set was placed. A 5 French vascular sheath was placed over a Bentson wire. The superior mesenteric artery was cannulated with a C2 catheter. Superior mesenteric arteriogram was performed. The C2 catheter was used to cannulate the celiac trunk. Celiac arteriography was performed. A Glidewire was advanced into the common hepatic artery and the C2 catheter was advanced into the proximal common hepatic artery. At this point, there appeared to be extensive spasm in the common hepatic artery. C2 catheter was pulled back to the celiac trunk origin and additional angiography was performed. Multiple attempts were made to cannulate the common hepatic artery with a Renegade micro catheter. Cannulating the common hepatic artery was very difficult with the micro catheter. As result, the C2 catheter was exchanged  for a Sos catheter. Eventually, a glide micro wire was advanced into the common hepatic artery and proper hepatic artery. The Renegade catheter was advanced to the common hepatic artery and additional angiography was performed. Catheter was also advanced into the proper hepatic artery  and additional angiography was performed. Multiple attempts were made to cannulate the gastroduodenal artery with a wire and catheter. These attempts at cannulating the gastroduodenal artery were unsuccessful. The micro catheter and 5 French catheter were removed. Final angiogram was performed through the right groin sheath. The groin sheath was removed with an Exoseal closure device. There was groin hemostasis at the end of the procedure.  COMPLICATIONS: None  FINDINGS: Superior mesenteric arteriogram: Patient has a metallic biliary stent and IVC filter. Patient has a large replaced right hepatic artery that originates near the origin of the superior mesenteric artery. There are small pancreatic-duodenal branches but no evidence for a vascular malformation or contrast extravasation. The delayed images demonstrate patency of the superior mesenteric veins but there is marked attenuation and narrowing of the portal confluence and main portal vein. The intrahepatic portal venous system appears to be patent.  Celiac arteriography: The splenic artery is large and patent. There is a prominent left gastric artery with filling of the right gastric artery. There is mild narrowing and irregularity at the common hepatic artery and proper hepatic artery. There appears to be irregularity of the gastroduodenal artery. There is narrowing involving the origin of the right hepatic artery. There is no evidence for acute extravasation. After a wire was advanced into the common hepatic artery, there was marked narrowing of the common hepatic artery suggestive for vasospasm and minimal flow into the gastroduodenal artery. At one point, there appeared to be retrograde flow into the gastroduodenal artery. After the vasospasm resolved, the flow to the common hepatic artery and gastroduodenal artery returned to baseline.  IMPRESSION: Irregularity of the common hepatic artery, gastroduodenal artery and proper hepatic artery. These findings  are related to the surrounding tumor burden. There was extensive vasospasm in the common hepatic artery during this procedure but no evidence for acute bleeding or contrast extravasation. The gastroduodenal artery could never be successfully cannulated and, therefore, an empiric gastroduodenal embolization could not be performed.  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   Electronically Signed   By: Richarda Overlie M.D.   On: 02/18/2013 10:29   Dg C-arm 1-60 Min-no Report  02/16/2013   CLINICAL DATA: stent placement   C-ARM 1-60 MINUTES  Fluoroscopy was utilized by the requesting physician.  No radiographic  interpretation.    Ir Rebekah [place] [facility] [employer] Guide Roadmapping  02/18/2013   INDICATION: History of metastatic pancreatic cancer, now with acute and recurrent upper GI bleeding from the duodenum based on recent endoscopy. Post failed attempted endoscopic duodenal stent placement and mesenteric arteriogram  EXAM: 1.  CELIAC ARTERIOGRAM  2.  COMMON HEPATIC ARTERIOGRAM (3RD ORDER).  3. SUB SELECTIVE GASTRODUODENAL ARTERIOGRAM (3RD ORDER) WITH PARTICLE AND COIL EMBOLIZATION  4.  ULTRASOUND GUIDANCE FOR ARTERIAL ACCESS  MEDICATIONS: Nitroglycerin 100 mcg IA  TECHNIQUE: Informed written consent was obtained from the patient after a discussion of the risks, benefits and alternatives to treatment. Questions regarding the procedure were encouraged and answered. A timeout was performed prior to the initiation of the procedure.  The right groin was prepped and draped in the usual sterile fashion, and a sterile drape was applied covering the operative field. Maximum  barrier sterile technique with sterile gowns and gloves were used for the procedure. A timeout was performed prior to the initiation of the procedure. Local anesthesia was provided with 1% lidocaine.  The right femoral head was marked fluoroscopically. Under ultrasound guidance, the right common femoral  artery was accessed with a micropuncture kit after the overlying soft tissues were anesthetized with 1% lidocaine. An ultrasound image was saved for documentation purposes. The micropuncture sheath was exchanged for a 5 [place] vascular sheath over a Bentson wire. A closure arteriogram was performed through the side of the sheath confirming access within the right common femoral artery.  Over a Bentson wire, a Mickelson catheter was advanced to the level of the thoracic aorta where it was back bled and flushed. The catheter was then utilized to select the celiac artery and a celiac arteriogram was performed. 100 mcg of nitroglycerin was not administered intra-arterially through the celiac arterial vascular distribution.  With the use of a Fathom 14 microwire, a regular Renegade microcatheter was advanced into the common hepatic artery and a common hepatic arteriogram was performed. The microcatheter was then advanced into the distal aspect of the gastroduodenal artery and a sub selective gastroduodenal arteriogram was performed.  Initially, the distal tributaries of the GDA were embolized with 700-900 micron Embospheres. Limited post particle embolization arteriogram was performed. Subsequently, the gastroduodenal artery was coil embolized to near its origin with a combination of overlapping 2 and 3 mm diameter interlock coils.  The micro catheter was retracted into the common hepatic artery and a repeat common hepatic arteriogram was performed. The micro catheter was removed and a post embolization celiac arteriogram was performed. All images were reviewed and the procedure was terminated. All wires, catheters and sheaths were removed from the patient. Hemostasis was achieved at the right groin access site with manual compression.  ANESTHESIA/SEDATION: Fentanyl 200 mcg IV; Versed 4 mg IV  60 minutes  CONTRAST:  50 cc Omnipaque 300  COMPARISON:  None.  FLUOROSCOPY TIME:  11 min, 42 seconds  COMPLICATIONS: None  immediate  FINDINGS: Celiac arteriogram demonstrates persistent irregular narrowing of both the common and proper hepatic arteries extending preferentially involve the origin of the right hepatic artery.  The gastroduodenal artery remains patent though irregularly narrowed throughout its course.  The gastroduodenal artery was successfully cannulated and embolized with a combination of 700 to 900 micron Embospheres and overlapping interlock coils to near its origin. 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  IMPRESSION: 1. Successful prophylactic particle and coil embolization of the gastroduodenal artery secondary to recurrent and refractory upper GI (duodenal) bleed due to malignant invasion from patient's known pancreatic head carcinoma. 2. While the common and proper hepatic arteries remain irregularly narrowed throughout their course, I do not feel that these vessels are contributing to the patient's current hemodynamic instability and as such were not embolized.  PLAN: Pending the patient's wishes, would recommend continued aggressive conservative management. 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   Electronically Signed   By: Simonne Come M.D.   On: 02/18/2013 13:58   Ct Angio Abd/pel W/ And/or W/o  02/23/2013   CLINICAL DATA:  GI bleed post embolization. Abdominal pain and distension.  EXAM: CT ANGIOGRAPHY ABDOMEN AND PELVIS  TECHNIQUE: Multidetector CT imaging of the abdomen and pelvis was performed using the standard protocol during bolus  administration of intravenous contrast. Multiplanar reconstructed images including MIPs were obtained and reviewed to evaluate the vascular anatomy.  CONTRAST:  OMNIPAQUE IOHEXOL 350 MG/ML SOLN  COMPARISON:  02/10/2013  FINDINGS: ARTERIAL FINDINGS:  Aorta: Minimal atheromatous plaque in its infrarenal segment. No aneurysm, dissection, or stenosis.  Celiac axis: Widely patent. Tortuous splenic artery. Attenuated left hepatic artery secondary to tumor encasement. Interval coil embolization of gastroduodenal artery.  Superior mesenteric: Patent. Replaced right hepatic arterial supply, an anatomic variant, with irregular narrowing of the proximal right hepatic artery secondary to tumor encasement.  Left renal:           Single, patent.  Right renal:          Single, patent.  Inferior mesenteric:  Patent  Left iliac:           Unremarkable  Right iliac:          Unremarkable  Venous findings: Infrarenal IVC filter with some trapped thrombus, without evidence of caval occlusion. Patent bilateral renal veins. There is tumor encasement of the portosplenic confluence with obstruction of the central aspect of the splenic vein, central superior mesenteric vein, and proximal portal vein. There has been some cavernous transformation of the portal vein with collateral flow maintaining patency of the distal main and right portal vein. Left portal vein is thrombosed. DVT noted in the right femoral vein, incompletely visualized through its distal aspect.  Review of the MIP images confirms the above findings.  Nonvascular findings: Platelike atelectasis posteriorly in the visualized lung bases. Small pleural effusions have developed. Progression of abdominal and pelvic ascites. There is no layering high attenuation component to suggest hemoperitoneum. Multiple low-attenuation hepatic lesions are again evident. There is a metallic stent through the common bile duct with left pneumobilia implying stent patency. Pancreatic head  mass encases the common duct and vasculature as detailed above. Wedge-shaped enhancement defects in the spleen as before. Stable 13 mm low-attenuation left adrenal nodule. Unremarkable right adrenal gland.  New small perfusion defects in both kidneys involving the upper pole on the left, lower pole on the right. These persist on delayed scans. There is however normal excretion of contrast into decompressed renal collecting systems and proximal ureters. The stomach, small bowel, and colon are nondilated. There is wall thickening suspected in multiple loops of nondistended mid and distal ileum, cecum and proximal ascending colon. Urinary bladder incompletely distended. Stable left para-aortic adenopathy. Stable mild superior endplate compression deformity of L3. Partially calcified disc protrusions L3-4, L4-5, L5-S1. Facet degenerative hypertrophy L3-S1, right worse than left.  IMPRESSION: 1. No evidence of retroperitoneal or intra-abdominal acute hemorrhage post embolization of the gastroduodenal artery. 2. Interval increase in abdominal ascites. 3. New small bilateral pleural effusions. 4. Wedge-shaped peripheral enhancement abnormalities in bilateral kidneys, new since previous study, may represent arterial emboli less likely multifocal pyelonephritis. 5. Progressive wall thickening in the distal jejunum and proximal ascending colon. Considerations include vascular insufficiency/ischemia versus colitis/enteritis. 6. Pancreatic mass with occlusion of the porto-splenic confluence and proximal main portal vein, encasement and narrowing of right and left hepatic arteries as before. 7. Right lower extremity DVT with nonocclusive thrombus noted in the infrarenal IVC filter. 8. 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   Electronically  Signed   By: Oley Balm M.D.   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 CT angiography is performed to assess vasculature prior to potential arteriography and transcatheter embolization to treat persistent bleeding.  EXAM: CT ANGIOGRAPHY ABDOMEN AND PELVIS WITH CONTRAST AND WITHOUT CONTRAST  TECHNIQUE: Multidetector CT imaging of the abdomen and pelvis was performed using the standard protocol during bolus administration of intravenous contrast. Multiplanar reconstructed images including MIPs were obtained and reviewed to evaluate the vascular anatomy.  CONTRAST:  OMNIPAQUE IOHEXOL 350 MG/ML SOLN  COMPARISON:  12/23/2012  FINDINGS: There is significant enlargement of the mass involving the head of the pancreas since the prior CT in October. Dimensions of the mass are now approximately 3.7 x 5.6 x 4.5 cm. The mass visibly encases the pre-existing biliary stent and also extends further to encase the gastroduodenal artery, portal venous confluence, hepatic artery and a segment of the superior mesenteric vein. 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 Portal flow is present in the right lobe.  Significant progression of metastatic disease in the liver is also identified in both left and right lobes. Index lesion in the inferior right lobe shows enlargement with dimensions of 2.5 x 3.5 cm (1.6 x 2.1 cm previously). The largest left lobe lesion has enlarged from a maximum diameter of 2.4 cm previously to estimated current maximum diameter of 4.0 cm. All previously identified metastatic lesions have increased significantly in size. There are at least 4 new metastatic lesions identified in the liver. There is no evidence of associated biliary obstruction with air remaining a biliary tree. The common bile duct stent lumen does not appear to be compromised.   There is new ascites with mild to moderate overall volume of ascites present as well as nodularity of the mesenteric and omentum suspicious for carcinomatosis. No bowel obstruction or perforation is identified.  Arterial vasculature evaluated by CTA shows no evidence of significant occlusive disease. The aorta and major visceral branches are normally patent. At the level of the celiac trunk, the common hepatic artery is narrowed and encased by tumor but remains open with flow noted in both right and left hepatic arteries. The gastroduodenal artery is open but encased by tumor. Small distal GDA branches are present supplying the pancreatic mass. There also appears to be pancreaticoduodenal supply from the SMA at the level of the pancreatic mass.  The superior mesenteric artery abuts tumor but is not visibly narrowed. Focal nonocclusive thrombus or direct tumor invasion is identified in the superior aspect of the superior mesenteric vein causing luminal stenosis of approximately 60%. The splenic vein remains open.  Bilateral renal artery show normal patency. The inferior mesenteric vein is open. Bilateral iliac and common femoral arteries show normal patency.  Appearance of the gallbladder and kidneys are unremarkable. The spleen shows a new focal wedge-shaped infarct in its posterior and inferior aspect. The rest of the spleen shows normal perfusion. There is a stable left adrenal nodule. Bony structures show degenerative changes of the spine without evidence of focal lesions.  Review of the MIP images confirms the above findings.  IMPRESSION: 1. Significant progression of metastatic pancreatic carcinoma with enlargement of the mass at the level of the head of the  pancreas and progression of hepatic metastatic disease. 2. 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 Tumor also encases and  narrows the common hepatic artery, right and left hepatic arteries and gastroduodenal artery. There also is tumor involvement at the level of the superior mesenteric vein. 3. 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   Electronically Signed   By: Irish Lack M.D.   On: 02/11/2013 10:34    Medications: Scheduled Meds: . folic acid  2 mg Oral Daily  . lipase/protease/amylase  2 capsule Oral TID AC & HS  . pantoprazole  80 mg Oral Daily  . sodium chloride  3 mL Intravenous Q12H  . sucralfate  2 g Oral Custom      LOS: 10 days   Chaya Jan M.D. [facility] 02/25/2013, 4:53 PM Pager: [phone]  If 7PM-7AM, please contact night-coverage [email or web address] Password [email or web address]

## 2013-02-25 NOTE — Progress Notes (Signed)
Austin Escobar is feeling a little better. He had over 5 L of fluid removed with paracentesis. This allow him to eat better. His abdomen is not as distended. I suspect that this is all from his underlying malignancy.  His CA 19-9 is 144,000.  Is not due to any obvious bleeding. He has 1 more radiation treatment left. Hopefully this plus the embolization will minimize his bleeding risk.  He is out of bed.  His labs today show a white cell  count 6.9. Hemoglobin is 8.9. Platelet count is 66,000. His factor VIII is 153%.  His vital signs are stable. Temperature is 98.6. Blood pressure 140/77.  Of note, a prealbumin on the 22nd was 7.5. I think this is incredibly prognostic.  His abdomen is not as distended. There is no obvious fluid wave. Bowel sounds are active. Lungs are clear. Cardiac exam regular rate and rhythm. Extremities shows some trace edema.  We will do another paracentesis tomorrow. I suspect that he still has fluid. We will see how much is a taken off.  He will have his last radiation therapy tomorrow. If all is stable, and I suspect we can discharge him in followup as an outpatient.  He clearly is at risk for bleeding again. We're just doing everything that we can think of to minimize this risk.  I'm not sure if he will ever qualify for a clinical trial.  As always, i thank the staff on [address] for the outstanding care of him.  Pete E.  Duwayne Heck 7:14

## 2013-02-26 ENCOUNTER — Encounter: Payer: Self-pay | Admitting: Radiation Oncology

## 2013-02-26 ENCOUNTER — Inpatient Hospital Stay (HOSPITAL_COMMUNITY): Payer: BC Managed Care – PPO

## 2013-02-26 ENCOUNTER — Ambulatory Visit
Admit: 2013-02-26 | Discharge: 2013-02-26 | Disposition: A | Discharge status: Home / Self Care | Payer: BC Managed Care – PPO | Attending: Radiation Oncology | Admitting: Radiation Oncology

## 2013-02-26 VITALS — BP 122/70 | HR 98 | Temp 98.5°F

## 2013-02-26 DIAGNOSIS — G464 Cerebellar stroke syndrome: Secondary | ICD-10-CM | POA: Diagnosis present

## 2013-02-26 DIAGNOSIS — I6789 Other cerebrovascular disease: Secondary | ICD-10-CM

## 2013-02-26 DIAGNOSIS — I10 Essential (primary) hypertension: Secondary | ICD-10-CM | POA: Diagnosis present

## 2013-02-26 DIAGNOSIS — C259 Malignant neoplasm of pancreas, unspecified: Secondary | ICD-10-CM

## 2013-02-26 LAB — COMPREHENSIVE METABOLIC PANEL
ALT: 17 U/L (ref 0–53)
AST: 23 U/L (ref 0–37)
Albumin: 2.2 g/dL — ABNORMAL LOW (ref 3.5–5.2)
BUN: 12 mg/dL (ref 6–23)
CO2: 21 mEq/L (ref 19–32)
Calcium: 8.5 mg/dL (ref 8.4–10.5)
Chloride: 104 mEq/L (ref 96–112)
Creatinine, Ser: 0.86 mg/dL (ref 0.50–1.35)
Sodium: 135 mEq/L (ref 135–145)
Total Bilirubin: 0.4 mg/dL (ref 0.3–1.2)

## 2013-02-26 LAB — APTT: aPTT: 33 seconds (ref 24–37)

## 2013-02-26 LAB — CBC
Hemoglobin: 9.2 g/dL — ABNORMAL LOW (ref 13.0–17.0)
MCHC: 33.6 g/dL (ref 30.0–36.0)
MCV: 90.1 fL (ref 78.0–100.0)
Platelets: 87 10*3/uL — ABNORMAL LOW (ref 150–400)
RBC: 3.04 MIL/uL — ABNORMAL LOW (ref 4.22–5.81)
RDW: 16 % — ABNORMAL HIGH (ref 11.5–15.5)
WBC: 9.7 10*3/uL (ref 4.0–10.5)

## 2013-02-26 LAB — VON WILLEBRAND PANEL
Coagulation Factor VIII: 153 % — ABNORMAL HIGH (ref 73–140)
Ristocetin Co-factor, Plasma: 508 % — ABNORMAL HIGH (ref 42–200)

## 2013-02-26 LAB — PROTIME-INR
INR: 1.32 (ref 0.00–1.49)
Prothrombin Time: 16.1 seconds — ABNORMAL HIGH (ref 11.6–15.2)

## 2013-02-26 NOTE — Progress Notes (Signed)
Patient ID: Austin Escobar  male  ZOX:[id number]    DOB: 09-04-1955    DOA: 02/15/2013  PCP: [name], DO  Assessment/Plan:  Cerebellar CVA -Unfortunately last pm (12/25) had an episode of word-finding difficulty and a CT Head showed evidence for an acute cerebellar CVA. -Aggressive work up not to be performed given his limited prognosis from his pancreatic cancer. -No anticoagulation given extensive GI bleed this admission.  Acute blood loss anemia  -Has received 9 units PRBCs during this admission; 2units FFP; 1 unit of platelets.  -Underwent embolization 12/18 of the GDA by IR 12/18.  -appreciate GI and IR followup  -Avoid NSAIDs  -Hemoglobin has remained fairly stable. Patient is hemodynamically stable , hemoglobin 8.9 today -Tolerating a regular diet -origin of bleed likely from duodenal bulb where there is tumor invasion  -CT abdomen showed no evidence of retroperitoneal or intra-abdominal acute hemorrhage post embolization of the gastroduodenal artery, interval increase in ascites  GI bleed  -As above.   Ascites - Ultrasound guided paracentesis 12/24 with 5.6 L out -Had a repeat paracentesis on 12.26 with 3.4 L out.  Coagulopathy  -Received 2 units FFP 02/16/2013 when his INR was 1.7  -vitamin K given 12/18   Thrombocytopenia  -Received 1 unit of platelets on 12/21.   Esophageal varices  -No bleeding noted from previous endoscopy  -Continue Protonix and Carafate   Saddle pulmonary embolus  -12/23/2012  -Status post IVC filter in December 2014  -Currently stable without any respiratory distress or hypoxemia   Atrial fibrillation  -Status post ablation 05/25/2012  -Currently in sinus rhythm  -Not a candidate for anticoagulation   Metastatic pancreatic adenocarcinoma with duodenal invasion  -Appreciate oncology followup  -continue radiation therapy.  -Dr. [name] is following   HTN  -Metoprolol tartrate is on hold due to soft blood pressures   dVT  Prophylaxis:  Code Status:-LIMITED CODE--NO CPR, NO SHOCK, NO INTUBATION, YES VASOPRESSOR MEDS   Family Communication:Discussed with the patient   Disposition: Likely home in 24-48 hours with hospice.    Subjective: Feels better after paracentesis. Has difficulty relaying his thoughts.  Objective: Weight change:   Intake/Output Summary (Last 24 hours) at 02/26/13 1725 Last data filed at 02/26/13 1000  Gross per 24 hour  Intake   1060 ml  Output      0 ml  Net   1060 ml   Blood pressure 118/75, pulse 88, temperature 98 F (36.7 C), temperature source Oral, resp. rate 16, height 6\' 1"  (1.854 m), weight 99.066 kg (218 lb 6.4 oz), SpO2 100.00%.  Physical Exam: General: Alert and awake, oriented x3, not in any acute distress. CVS: S1-S2 clear, no murmur rubs or gallops Chest: clear to auscultation bilaterally, no wheezing, rales or rhonchi Abdomen: soft nontender, nondistended, normal bowel sounds  Extremities: no cyanosis, clubbing. 1+ edema noted bilaterally Neuro: Cranial nerves II-XII intact, no focal neurological deficits  Lab Results: Basic Metabolic Panel:  Recent Labs Lab 02/22/13 0925 02/26/13 0130  NA 137 135  K 3.2* 3.8  CL 108 104  CO2 20 21  GLUCOSE 175* 125*  BUN 9 12  CREATININE 0.83 0.86  CALCIUM 8.4 8.5   Liver Function Tests:  Recent Labs Lab 02/22/13 0925 02/26/13 0130  AST 28 23  ALT 24 17  ALKPHOS 88 94  BILITOT 0.5 0.4  PROT 5.4* 5.3*  ALBUMIN 2.2* 2.2*   No results found for this basename: LIPASE, AMYLASE,  in the last 168 hours No results found  for this basename: AMMONIA,  in the last 168 hours CBC:  Recent Labs Lab 02/25/13 0540 02/26/13 0130  WBC 6.9 9.7  HGB 8.9* 9.2*  HCT 27.2* 27.4*  MCV 90.1 90.1  PLT 66* 87*   Cardiac Enzymes: No results found for this basename: CKTOTAL, CKMB, CKMBINDEX, TROPONINI,  in the last 168 hours BNP: No components found with this basename: POCBNP,  CBG: No results found for this  basename: GLUCAP,  in the last 168 hours   Micro Results: No results found for this or any previous visit (from the past 240 hour(s)).  Studies/Results: Ir Angiogram Visceral Selective  02/18/2013   INDICATION: History of metastatic pancreatic cancer, now with acute and recurrent upper GI bleeding from the duodenum based on recent endoscopy. Post failed attempted endoscopic duodenal stent placement and mesenteric arteriogram  EXAM: 1.  CELIAC ARTERIOGRAM  2.  COMMON HEPATIC ARTERIOGRAM (3RD ORDER).  3. SUB SELECTIVE GASTRODUODENAL ARTERIOGRAM (3RD ORDER) WITH PARTICLE AND COIL EMBOLIZATION  4.  ULTRASOUND GUIDANCE FOR ARTERIAL ACCESS  MEDICATIONS: Nitroglycerin 100 mcg IA  TECHNIQUE: Informed written consent was obtained from the patient after a discussion of the risks, benefits and alternatives to treatment. Questions regarding the procedure were encouraged and answered. A timeout was performed prior to the initiation of the procedure.  The right groin was prepped and draped in the usual sterile fashion, and a sterile drape was applied covering the operative field. Maximum barrier sterile technique with sterile gowns and gloves were used for the procedure. A timeout was performed prior to the initiation of the procedure. Local anesthesia was provided with 1% lidocaine.  The right femoral head was marked fluoroscopically. Under ultrasound guidance, the right common femoral artery was accessed with a micropuncture kit after the overlying soft tissues were anesthetized with 1% lidocaine. An ultrasound image was saved for documentation purposes. The micropuncture sheath was exchanged for a 5 [place] vascular sheath over a Bentson wire. A closure arteriogram was performed through the side of the sheath confirming access within the right common femoral artery.  Over a Bentson wire, a Mickelson catheter was advanced to the level of the thoracic aorta where it was back bled and flushed. The catheter was then  utilized to select the celiac artery and a celiac arteriogram was performed. 100 mcg of nitroglycerin was not administered intra-arterially through the celiac arterial vascular distribution.  With the use of a Fathom 14 microwire, a regular Renegade microcatheter was advanced into the common hepatic artery and a common hepatic arteriogram was performed. The microcatheter was then advanced into the distal aspect of the gastroduodenal artery and a sub selective gastroduodenal arteriogram was performed.  Initially, the distal tributaries of the GDA were embolized with 700-900 micron Embospheres. Limited post particle embolization arteriogram was performed. Subsequently, the gastroduodenal artery was coil embolized to near its origin with a combination of overlapping 2 and 3 mm diameter interlock coils.  The micro catheter was retracted into the common hepatic artery and a repeat common hepatic arteriogram was performed. The micro catheter was removed and a post embolization celiac arteriogram was performed. All images were reviewed and the procedure was terminated. All wires, catheters and sheaths were removed from the patient. Hemostasis was achieved at the right groin access site with manual compression.  ANESTHESIA/SEDATION: Fentanyl 200 mcg IV; Versed 4 mg IV  60 minutes  CONTRAST:  50 cc Omnipaque 300  COMPARISON:  None.  FLUOROSCOPY TIME:  11 min, 42 seconds  COMPLICATIONS: None immediate  FINDINGS:  Celiac arteriogram demonstrates persistent irregular narrowing of both the common and proper hepatic arteries extending preferentially involve the origin of the right hepatic artery.  The gastroduodenal artery remains patent though irregularly narrowed throughout its course.  The gastroduodenal artery was successfully cannulated and embolized with a combination of 700 to 900 micron Embospheres and overlapping interlock coils to near its origin. Post embolization arteriogram demonstrates complete occlusion of the  gastroduodenal artery.  IMPRESSION: 1. Successful prophylactic particle and coil embolization of the gastroduodenal artery secondary to recurrent and refractory upper GI (duodenal) bleed due to malignant invasion from patient's known pancreatic head carcinoma. 2. While the common and proper hepatic arteries remain irregularly narrowed throughout their course, I do not feel that these vessels are contributing to the patient's current hemodynamic instability and as such were not embolized.  PLAN: Pending the patient's wishes, would recommend continued aggressive conservative management. 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   Electronically Signed   By: Simonne Come M.D.   On: 02/18/2013 13:58   Ir Angiogram Visceral Selective  02/18/2013   CLINICAL DATA:  57 year old with metastatic pancreatic cancer. Acute and recurrent upper GI bleeding. The patient has had previous duodenal bleeding based on endoscopy.  EXAM: VISCERAL ARTERIOGRAPHY ; SELECTIVE VISCERAL ARTERIOGRAPHY ; ULTRASOUND GUIDANCE FOR VASCULAR ACCESS  Physician: Rachelle Hora. Henn, MD  MEDICATIONS: Versed 5 mg, fentanyl 100 mcg. A radiology nurse monitored the patient for moderate sedation.  ANESTHESIA/SEDATION: Moderate sedation time: 1 hr and 45 min  CONTRAST:  85 mL Omnipaque 300  FLUOROSCOPY TIME:  29 min and 30 seconds  PROCEDURE: The procedure was explained to the patient. The risks and benefits of the procedure were discussed and the patient's questions were addressed. Informed consent was obtained from the patient. Patient was brought to  the interventional suite. The right groin was prepped and draped in sterile fashion. Maximal barrier sterile technique was utilized including caps, mask, sterile gowns, sterile gloves, sterile drape, hand hygiene and skin antiseptic. The skin was anesthetized with 1% lidocaine. Using ultrasound guidance, a 21 gauge needle was directed into the right common femoral artery and a micropuncture dilator set was placed. A 5 French vascular sheath was placed over a Bentson wire. The superior mesenteric artery was cannulated with a C2 catheter. Superior mesenteric arteriogram was performed. The C2 catheter was used to cannulate the celiac trunk. Celiac arteriography was performed. A Glidewire was advanced into the common hepatic artery and the C2 catheter was advanced into the proximal common hepatic artery. At this point, there appeared to be extensive spasm in the common hepatic artery. C2 catheter was pulled back to the celiac trunk origin and additional angiography was performed. Multiple attempts were made to cannulate the common hepatic artery with a Renegade micro catheter. Cannulating the common hepatic artery was very difficult with the micro catheter. As result, the C2 catheter was exchanged for a Sos catheter. Eventually, a glide micro wire was advanced into the common hepatic artery and proper hepatic artery. The Renegade catheter was advanced to the common hepatic artery and additional angiography was performed. Catheter was also advanced into the proper hepatic  artery and additional angiography was performed. Multiple attempts were made to cannulate the gastroduodenal artery with a wire and catheter. These attempts at cannulating the gastroduodenal artery were unsuccessful. The micro catheter and 5 French catheter were removed. Final angiogram was performed through the right groin sheath. The groin sheath was removed with an Exoseal closure device. There was groin hemostasis at the end of the procedure.   COMPLICATIONS: None  FINDINGS: Superior mesenteric arteriogram: Patient has a metallic biliary stent and IVC filter. Patient has a large replaced right hepatic artery that originates near the origin of the superior mesenteric artery. There are small pancreatic-duodenal branches but no evidence for a vascular malformation or contrast extravasation. The delayed images demonstrate patency of the superior mesenteric veins but there is marked attenuation and narrowing of the portal confluence and main portal vein. The intrahepatic portal venous system appears to be patent.  Celiac arteriography: The splenic artery is large and patent. There is a prominent left gastric artery with filling of the right gastric artery. There is mild narrowing and irregularity at the common hepatic artery and proper hepatic artery. There appears to be irregularity of the gastroduodenal artery. There is narrowing involving the origin of the right hepatic artery. There is no evidence for acute extravasation. After a wire was advanced into the common hepatic artery, there was marked narrowing of the common hepatic artery suggestive for vasospasm and minimal flow into the gastroduodenal artery. At one point, there appeared to be retrograde flow into the gastroduodenal artery. After the vasospasm resolved, the flow to the common hepatic artery and gastroduodenal artery returned to baseline.  IMPRESSION: Irregularity of the common hepatic artery, gastroduodenal artery and proper hepatic artery. These findings are related to the surrounding tumor burden. There was extensive vasospasm in the common hepatic artery during this procedure but no evidence for acute bleeding or contrast extravasation. The gastroduodenal artery could never be successfully cannulated and, therefore, an empiric gastroduodenal embolization could not be performed.  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   Electronically Signed   By: Richarda Overlie M.D.   On: 02/18/2013 10:29   Ir Angiogram Visceral Selective  02/18/2013   CLINICAL DATA:  57 year old with metastatic pancreatic cancer. Acute and recurrent upper GI bleeding. The patient has had previous duodenal bleeding based on endoscopy.  EXAM: VISCERAL ARTERIOGRAPHY ; SELECTIVE VISCERAL ARTERIOGRAPHY ; ULTRASOUND GUIDANCE FOR VASCULAR ACCESS  Physician: Rachelle Hora. Henn, MD  MEDICATIONS: Versed 5 mg, fentanyl 100 mcg. A radiology nurse monitored the patient for moderate sedation.  ANESTHESIA/SEDATION: Moderate sedation time: 1 hr and 45 min  CONTRAST:  85 mL Omnipaque 300  FLUOROSCOPY TIME:  29 min and 30 seconds  PROCEDURE: The procedure was explained to the patient. The risks and benefits of the procedure were discussed and the patient's questions were addressed. Informed consent was obtained from the patient. Patient was brought to the interventional suite. The right groin was prepped and draped in sterile fashion. Maximal barrier sterile technique was utilized including caps, mask, sterile gowns, sterile gloves, sterile drape, hand hygiene and skin antiseptic. The skin was anesthetized with 1% lidocaine. Using ultrasound guidance, a 21 gauge needle was directed into the right common femoral artery and a micropuncture dilator set was placed. A 5 French vascular sheath was placed over a Bentson wire. The superior mesenteric artery was cannulated with a C2 catheter. Superior mesenteric arteriogram was performed. The C2 catheter was used to cannulate the celiac trunk. Celiac  arteriography was performed. A Glidewire was advanced into the common hepatic artery and the C2 catheter was advanced into the proximal common hepatic artery. At this point, there appeared to be extensive spasm in the common hepatic artery. C2 catheter was pulled back to the celiac trunk origin and additional angiography was performed. Multiple attempts were made to cannulate the common hepatic artery with a Renegade micro catheter.  Cannulating the common hepatic artery was very difficult with the micro catheter. As result, the C2 catheter was exchanged for a Sos catheter. Eventually, a glide micro wire was advanced into the common hepatic artery and proper hepatic artery. The Renegade catheter was advanced to the common hepatic artery and additional angiography was performed. Catheter was also advanced into the proper hepatic artery and additional angiography was performed. Multiple attempts were made to cannulate the gastroduodenal artery with a wire and catheter. These attempts at cannulating the gastroduodenal artery were unsuccessful. The micro catheter and 5 French catheter were removed. Final angiogram was performed through the right groin sheath. The groin sheath was removed with an Exoseal closure device. There was groin hemostasis at the end of the procedure.  COMPLICATIONS: None  FINDINGS: Superior mesenteric arteriogram: Patient has a metallic biliary stent and IVC filter. Patient has a large replaced right hepatic artery that originates near the origin of the superior mesenteric artery. There are small pancreatic-duodenal branches but no evidence for a vascular malformation or contrast extravasation. The delayed images demonstrate patency of the superior mesenteric veins but there is marked attenuation and narrowing of the portal confluence and main portal vein. The intrahepatic portal venous system appears to be patent.  Celiac arteriography: The splenic artery is large and patent. There is a prominent left gastric artery with filling of the right gastric artery. There is mild narrowing and irregularity at the common hepatic artery and proper hepatic artery. There appears to be irregularity of the gastroduodenal artery. There is narrowing involving the origin of the right hepatic artery. There is no evidence for acute extravasation. After a wire was advanced into the common hepatic artery, there was marked narrowing of the common  hepatic artery suggestive for vasospasm and minimal flow into the gastroduodenal artery. At one point, there appeared to be retrograde flow into the gastroduodenal artery. After the vasospasm resolved, the flow to the common hepatic artery and gastroduodenal artery returned to baseline.  IMPRESSION: Irregularity of the common hepatic artery, gastroduodenal artery and proper hepatic artery. These findings are related to the surrounding tumor burden. There was extensive vasospasm in the common hepatic artery during this procedure but no evidence for acute bleeding or contrast extravasation. The gastroduodenal artery could never be successfully cannulated and, therefore, an empiric gastroduodenal embolization could not be performed.  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   Electronically Signed   By: Richarda Overlie M.D.   On: 02/18/2013 10:29   Ir Angiogram Selective Each Additional Vessel  02/18/2013   INDICATION: History of metastatic pancreatic cancer, now with acute and recurrent upper GI bleeding from the duodenum based on recent endoscopy. Post failed attempted endoscopic duodenal stent placement and mesenteric arteriogram  EXAM: 1.  CELIAC ARTERIOGRAM  2.  COMMON HEPATIC ARTERIOGRAM (3RD ORDER).  3. SUB SELECTIVE GASTRODUODENAL ARTERIOGRAM (3RD ORDER) WITH PARTICLE AND COIL EMBOLIZATION  4.  ULTRASOUND GUIDANCE FOR ARTERIAL ACCESS  MEDICATIONS: Nitroglycerin 100 mcg IA  TECHNIQUE: Informed written consent was obtained from the patient after a discussion of the risks, benefits  and alternatives to treatment. Questions regarding the procedure were encouraged and answered. A timeout was performed prior to the initiation of the procedure.  The right groin was prepped and draped in the usual sterile fashion, and a sterile drape was applied covering the operative field. Maximum barrier sterile technique with sterile gowns and gloves were used for the procedure. A timeout was  performed prior to the initiation of the procedure. Local anesthesia was provided with 1% lidocaine.  The right femoral head was marked fluoroscopically. Under ultrasound guidance, the right common femoral artery was accessed with a micropuncture kit after the overlying soft tissues were anesthetized with 1% lidocaine. An ultrasound image was saved for documentation purposes. The micropuncture sheath was exchanged for a 5 [place] vascular sheath over a Bentson wire. A closure arteriogram was performed through the side of the sheath confirming access within the right common femoral artery.  Over a Bentson wire, a Mickelson catheter was advanced to the level of the thoracic aorta where it was back bled and flushed. The catheter was then utilized to select the celiac artery and a celiac arteriogram was performed. 100 mcg of nitroglycerin was not administered intra-arterially through the celiac arterial vascular distribution.  With the use of a Fathom 14 microwire, a regular Renegade microcatheter was advanced into the common hepatic artery and a common hepatic arteriogram was performed. The microcatheter was then advanced into the distal aspect of the gastroduodenal artery and a sub selective gastroduodenal arteriogram was performed.  Initially, the distal tributaries of the GDA were embolized with 700-900 micron Embospheres. Limited post particle embolization arteriogram was performed. Subsequently, the gastroduodenal artery was coil embolized to near its origin with a combination of overlapping 2 and 3 mm diameter interlock coils.  The micro catheter was retracted into the common hepatic artery and a repeat common hepatic arteriogram was performed. The micro catheter was removed and a post embolization celiac arteriogram was performed. All images were reviewed and the procedure was terminated. All wires, catheters and sheaths were removed from the patient. Hemostasis was achieved at the right groin access site with  manual compression.  ANESTHESIA/SEDATION: Fentanyl 200 mcg IV; Versed 4 mg IV  60 minutes  CONTRAST:  50 cc Omnipaque 300  COMPARISON:  None.  FLUOROSCOPY TIME:  11 min, 42 seconds  COMPLICATIONS: None immediate  FINDINGS: Celiac arteriogram demonstrates persistent irregular narrowing of both the common and proper hepatic arteries extending preferentially involve the origin of the right hepatic artery.  The gastroduodenal artery remains patent though irregularly narrowed throughout its course.  The gastroduodenal artery was successfully cannulated and embolized with a combination of 700 to 900 micron Embospheres and overlapping interlock coils to near its origin. 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  IMPRESSION: 1. Successful prophylactic particle and coil embolization of the gastroduodenal artery secondary to recurrent and refractory upper GI (duodenal) bleed due to malignant invasion from patient's known pancreatic head carcinoma. 2. While the common and proper hepatic arteries remain irregularly narrowed throughout their course, I do not feel that these vessels are contributing to the patient's current hemodynamic instability and as such were not embolized.  PLAN: Pending the patient's wishes, would recommend continued aggressive conservative management. 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   Electronically Signed   By: Simonne Come M.D.   On: 02/18/2013 13:58   Ir Angiogram Selective Each Additional Vessel  02/18/2013   CLINICAL DATA:   57 year old with metastatic pancreatic cancer. Acute and recurrent upper GI bleeding. The patient has had previous duodenal bleeding based on endoscopy.  EXAM: VISCERAL ARTERIOGRAPHY ; SELECTIVE VISCERAL ARTERIOGRAPHY ; ULTRASOUND GUIDANCE FOR VASCULAR ACCESS  Physician: Rachelle Hora. Henn, MD  MEDICATIONS: Versed 5 mg, fentanyl 100 mcg. A radiology nurse monitored the patient for moderate sedation.  ANESTHESIA/SEDATION: Moderate sedation time: 1 hr and 45 min  CONTRAST:  85 mL Omnipaque 300  FLUOROSCOPY TIME:  29 min and 30 seconds  PROCEDURE: The procedure was explained to the patient. The risks and benefits of the procedure were discussed and the patient's questions were addressed. Informed consent was obtained from the patient. Patient was brought to the interventional suite. The right groin was prepped and draped in sterile fashion. Maximal barrier sterile technique was utilized including caps, mask, sterile gowns, sterile gloves, sterile drape, hand hygiene and skin antiseptic. The skin was anesthetized with 1% lidocaine. Using ultrasound guidance, a 21 gauge needle was directed into the right common femoral artery and a micropuncture dilator set was placed. A 5 French vascular sheath was placed over a Bentson wire. The superior mesenteric artery was cannulated with a C2 catheter. Superior mesenteric arteriogram was performed. The C2 catheter was used to cannulate the celiac trunk. Celiac arteriography was performed. A Glidewire was advanced into the common hepatic artery and the C2 catheter was advanced into the proximal common hepatic artery. At this point, there appeared to be extensive spasm in the common hepatic artery. C2 catheter was pulled back to the celiac trunk origin and additional angiography was performed. Multiple attempts were made to cannulate the common hepatic artery with a Renegade micro catheter. Cannulating the common hepatic artery was very difficult with the micro catheter. As result, the C2  catheter was exchanged for a Sos catheter. Eventually, a glide micro wire was advanced into the common hepatic artery and proper hepatic artery. The Renegade catheter was advanced to the common hepatic artery and additional angiography was performed. Catheter was also advanced into the proper hepatic artery and additional angiography was performed. Multiple attempts were made to cannulate the gastroduodenal artery with a wire and catheter. These attempts at cannulating the gastroduodenal artery were unsuccessful. The micro catheter and 5 French catheter were removed. Final angiogram was performed through the right groin sheath. The groin sheath was removed with an Exoseal closure device. There was groin hemostasis at the end of the procedure.  COMPLICATIONS: None  FINDINGS: Superior mesenteric arteriogram: Patient has a metallic biliary stent and IVC filter. Patient has a large replaced right hepatic artery that originates near the origin of the superior mesenteric artery. There are small pancreatic-duodenal branches but no evidence for a vascular malformation or contrast extravasation. The delayed images demonstrate patency of the superior mesenteric veins but there is marked attenuation and narrowing of the portal confluence and main portal vein. The intrahepatic portal venous system appears to be patent.  Celiac arteriography: The splenic artery is large and patent. There is a prominent left gastric artery with filling of the right gastric artery. There is mild narrowing and irregularity at the common hepatic artery and proper hepatic artery. There appears to be irregularity of the  gastroduodenal artery. There is narrowing involving the origin of the right hepatic artery. There is no evidence for acute extravasation. After a wire was advanced into the common hepatic artery, there was marked narrowing of the common hepatic artery suggestive for vasospasm and minimal flow into the gastroduodenal artery. At one  point, there appeared to be retrograde flow into the gastroduodenal artery. After the vasospasm resolved, the flow to the common hepatic artery and gastroduodenal artery returned to baseline.  IMPRESSION: Irregularity of the common hepatic artery, gastroduodenal artery and proper hepatic artery. These findings are related to the surrounding tumor burden. There was extensive vasospasm in the common hepatic artery during this procedure but no evidence for acute bleeding or contrast extravasation. The gastroduodenal artery could never be successfully cannulated and, therefore, an empiric gastroduodenal embolization could not be performed.  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   Electronically Signed   By: Richarda Overlie M.D.   On: 02/18/2013 10:29   Ir Angiogram Selective Each Additional Vessel  02/18/2013   CLINICAL DATA:  57 year old with metastatic pancreatic cancer. Acute and recurrent upper GI bleeding. The patient has had previous duodenal bleeding based on endoscopy.  EXAM: VISCERAL ARTERIOGRAPHY ; SELECTIVE VISCERAL ARTERIOGRAPHY ; ULTRASOUND GUIDANCE FOR VASCULAR ACCESS  Physician: Rachelle Hora. Henn, MD  MEDICATIONS: Versed 5 mg, fentanyl 100 mcg. A radiology nurse monitored the patient for moderate sedation.  ANESTHESIA/SEDATION: Moderate sedation time: 1 hr and 45 min  CONTRAST:  85 mL Omnipaque 300  FLUOROSCOPY TIME:  29 min and 30 seconds  PROCEDURE: The procedure was explained to the patient. The risks and benefits of the procedure were discussed and the patient's questions were addressed. Informed consent was obtained from the patient. Patient was brought to the interventional suite. The right groin was prepped and draped in sterile fashion. Maximal barrier sterile technique was utilized including caps, mask, sterile gowns, sterile gloves, sterile drape, hand hygiene and skin antiseptic. The skin was anesthetized with 1% lidocaine. Using ultrasound guidance, a 21 gauge  needle was directed into the right common femoral artery and a micropuncture dilator set was placed. A 5 French vascular sheath was placed over a Bentson wire. The superior mesenteric artery was cannulated with a C2 catheter. Superior mesenteric arteriogram was performed. The C2 catheter was used to cannulate the celiac trunk. Celiac arteriography was performed. A Glidewire was advanced into the common hepatic artery and the C2 catheter was advanced into the proximal common hepatic artery. At this point, there appeared to be extensive spasm in the common hepatic artery. C2 catheter was pulled back to the celiac trunk origin and additional angiography was performed. Multiple attempts were made to cannulate the common hepatic artery with a Renegade micro catheter. Cannulating the common hepatic artery was very difficult with the micro catheter. As result, the C2 catheter was exchanged for a Sos catheter. Eventually, a glide micro wire was advanced into the common hepatic artery and proper hepatic artery. The Renegade catheter was advanced to the common hepatic artery and additional angiography was performed. Catheter was also advanced into the proper hepatic artery and additional angiography was performed. Multiple attempts were made to cannulate the gastroduodenal artery with a wire and catheter. These attempts at cannulating the gastroduodenal artery were unsuccessful. The micro catheter and 5 French catheter were removed. Final angiogram was performed through the right groin sheath. The groin sheath was removed with an Exoseal closure device. There was groin hemostasis at the end of  the procedure.  COMPLICATIONS: None  FINDINGS: Superior mesenteric arteriogram: Patient has a metallic biliary stent and IVC filter. Patient has a large replaced right hepatic artery that originates near the origin of the superior mesenteric artery. There are small pancreatic-duodenal branches but no evidence for a vascular malformation  or contrast extravasation. The delayed images demonstrate patency of the superior mesenteric veins but there is marked attenuation and narrowing of the portal confluence and main portal vein. The intrahepatic portal venous system appears to be patent.  Celiac arteriography: The splenic artery is large and patent. There is a prominent left gastric artery with filling of the right gastric artery. There is mild narrowing and irregularity at the common hepatic artery and proper hepatic artery. There appears to be irregularity of the gastroduodenal artery. There is narrowing involving the origin of the right hepatic artery. There is no evidence for acute extravasation. After a wire was advanced into the common hepatic artery, there was marked narrowing of the common hepatic artery suggestive for vasospasm and minimal flow into the gastroduodenal artery. At one point, there appeared to be retrograde flow into the gastroduodenal artery. After the vasospasm resolved, the flow to the common hepatic artery and gastroduodenal artery returned to baseline.  IMPRESSION: Irregularity of the common hepatic artery, gastroduodenal artery and proper hepatic artery. These findings are related to the surrounding tumor burden. There was extensive vasospasm in the common hepatic artery during this procedure but no evidence for acute bleeding or contrast extravasation. The gastroduodenal artery could never be successfully cannulated and, therefore, an empiric gastroduodenal embolization could not be performed.  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   Electronically Signed   By: Richarda Overlie M.D.   On: 02/18/2013 10:29   Ir Ivc Filter Plmt / S&i /img Guid/mod Sed  02/11/2013   CLINICAL DATA:  Metastatic pancreatic carcinoma. History of pulmonary embolism. GI bleeding, a relative contraindication to anticoagulation.  EXAM: INFERIOR VENACAVOGRAM  IVC FILTER PLACEMENT UNDER FLUOROSCOPY  TECHNIQUE: The  procedure, risks (including but not limited to bleeding, infection, organ damage ), benefits, and alternatives were explained to the patient. Questions regarding the procedure were encouraged and answered. The patient understands and consents to the procedure. Caval anatomy reviewed on previous CT abdomen. Patency of the right IJ vein was confirmed with ultrasound with image documentation. An appropriate skin site was determined. Skin site was marked, prepped with chlorhexidine, and draped using maximum barrier technique. The region was infiltrated locally with 1% lidocaine.  Intravenous Fentanyl and Versed were administered as conscious sedation during continuous cardiorespiratory monitoring by the radiology RN, with a total moderate sedation time of less than 30 minutes.  Under real-time ultrasound guidance, the right IJ vein was accessed with a 21 gauge micropuncture needle; the needle tip within the vein was confirmed with ultrasound image documentation. The needle was exchanged over a 018 guidewire for a transitional dilator, which allow advancement of the [facility] wire into the IVC. A long 6 French vascular sheath was placed for inferior venacavography. This demonstrated no caval thrombus. Renal vein inflows were evident.  The [facility] IVC filter was advanced through the sheath and successfully deployed under fluoroscopy at the L2-3 disc level. Followup cavagram demonstrates stable filter position and no evident complication. The sheath was removed and hemostasis achieved at the site. No immediate complication.  FLUOROSCOPY TIME:  36 seconds  IMPRESSION: 1. Normal IVC. No thrombus or significant anatomic variation. 2. Technically successful infrarenal IVC filter placement.  This is a retrievable model.   Electronically Signed   By: Oley Balm M.D.   On: 02/11/2013 13:43   US Paracentesis  02/24/2013   CLINICAL DATA:  Abdominal distention, ascites. Request diagnostic and therapeutic paracentesis.  EXAM:  ULTRASOUND GUIDED PARACENTESIS  COMPARISON:  None  PROCEDURE: An ultrasound guided paracentesis was thoroughly discussed with the patient and questions answered. The benefits, risks, alternatives and complications were also discussed. The patient understands and wishes to proceed with the procedure. Written consent was obtained.  Ultrasound was performed to localize and mark an adequate pocket of fluid in the right lower quadrant of the abdomen. The area was then prepped and draped in the normal sterile fashion. 1% Lidocaine was used for local anesthesia. Under ultrasound guidance a 19 gauge Yueh catheter was introduced. Paracentesis was performed. The catheter was removed and a dressing applied.  COMPLICATIONS: None immediate  FINDINGS: A total of approximately 5.6 L of clear yellow fluid was removed. A fluid sample was sent for laboratory analysis.  IMPRESSION: Successful ultrasound guided paracentesis yielding 5.6 L of ascites.  Read by: Brayton El PA-C   Electronically Signed   By: Maryclare Bean M.D.   On: 02/24/2013 09:29   Ir US Guide Vasc Access Right  02/18/2013   INDICATION: History of metastatic pancreatic cancer, now with acute and recurrent upper GI bleeding from the duodenum based on recent endoscopy. Post failed attempted endoscopic duodenal stent placement and mesenteric arteriogram  EXAM: 1.  CELIAC ARTERIOGRAM  2.  COMMON HEPATIC ARTERIOGRAM (3RD ORDER).  3. SUB SELECTIVE GASTRODUODENAL ARTERIOGRAM (3RD ORDER) WITH PARTICLE AND COIL EMBOLIZATION  4.  ULTRASOUND GUIDANCE FOR ARTERIAL ACCESS  MEDICATIONS: Nitroglycerin 100 mcg IA  TECHNIQUE: Informed written consent was obtained from the patient after a discussion of the risks, benefits and alternatives to treatment. Questions regarding the procedure were encouraged and answered. A timeout was performed prior to the initiation of the procedure.  The right groin was prepped and draped in the usual sterile fashion, and a sterile drape was applied  covering the operative field. Maximum barrier sterile technique with sterile gowns and gloves were used for the procedure. A timeout was performed prior to the initiation of the procedure. Local anesthesia was provided with 1% lidocaine.  The right femoral head was marked fluoroscopically. Under ultrasound guidance, the right common femoral artery was accessed with a micropuncture kit after the overlying soft tissues were anesthetized with 1% lidocaine. An ultrasound image was saved for documentation purposes. The micropuncture sheath was exchanged for a 5 [place] vascular sheath over a Bentson wire. A closure arteriogram was performed through the side of the sheath confirming access within the right common femoral artery.  Over a Bentson wire, a Mickelson catheter was advanced to the level of the thoracic aorta where it was back bled and flushed. The catheter was then utilized to select the celiac artery and a celiac arteriogram was performed. 100 mcg of nitroglycerin was not administered intra-arterially through the celiac arterial vascular distribution.  With the use of a Fathom 14 microwire, a regular Renegade microcatheter was advanced into the common hepatic artery and a common hepatic arteriogram was performed. The microcatheter was then advanced into the distal aspect of the gastroduodenal artery and a sub selective gastroduodenal arteriogram was performed.  Initially, the distal tributaries of the GDA were embolized with 700-900 micron Embospheres. Limited post particle embolization arteriogram was performed. Subsequently, the gastroduodenal artery was coil embolized to near its origin with a combination of  overlapping 2 and 3 mm diameter interlock coils.  The micro catheter was retracted into the common hepatic artery and a repeat common hepatic arteriogram was performed. The micro catheter was removed and a post embolization celiac arteriogram was performed. All images were reviewed and the procedure was  terminated. All wires, catheters and sheaths were removed from the patient. Hemostasis was achieved at the right groin access site with manual compression.  ANESTHESIA/SEDATION: Fentanyl 200 mcg IV; Versed 4 mg IV  60 minutes  CONTRAST:  50 cc Omnipaque 300  COMPARISON:  None.  FLUOROSCOPY TIME:  11 min, 42 seconds  COMPLICATIONS: None immediate  FINDINGS: Celiac arteriogram demonstrates persistent irregular narrowing of both the common and proper hepatic arteries extending preferentially involve the origin of the right hepatic artery.  The gastroduodenal artery remains patent though irregularly narrowed throughout its course.  The gastroduodenal artery was successfully cannulated and embolized with a combination of 700 to 900 micron Embospheres and overlapping interlock coils to near its origin. 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  IMPRESSION: 1. Successful prophylactic particle and coil embolization of the gastroduodenal artery secondary to recurrent and refractory upper GI (duodenal) bleed due to malignant invasion from patient's known pancreatic head carcinoma. 2. While the common and proper hepatic arteries remain irregularly narrowed throughout their course, I do not feel that these vessels are contributing to the patient's current hemodynamic instability and as such were not embolized.  PLAN: Pending the patient's wishes, would recommend continued aggressive conservative management. 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   Electronically  Signed   By: Simonne Come M.D.   On: 02/18/2013 13:58   Ir US Guide Vasc Access Right  02/18/2013   CLINICAL DATA:  56 year old with metastatic pancreatic cancer. Acute and recurrent upper GI bleeding. The patient has had previous duodenal bleeding based on endoscopy.  EXAM: VISCERAL ARTERIOGRAPHY ; SELECTIVE VISCERAL ARTERIOGRAPHY ; ULTRASOUND GUIDANCE FOR VASCULAR ACCESS  Physician: Rachelle Hora. Henn, MD  MEDICATIONS: Versed 5 mg, fentanyl 100 mcg. A radiology nurse monitored the patient for moderate sedation.  ANESTHESIA/SEDATION: Moderate sedation time: 1 hr and 45 min  CONTRAST:  85 mL Omnipaque 300  FLUOROSCOPY TIME:  29 min and 30 seconds  PROCEDURE: The procedure was explained to the patient. The risks and benefits of the procedure were discussed and the patient's questions were addressed. Informed consent was obtained from the patient. Patient was brought to the interventional suite. The right groin was prepped and draped in sterile fashion. Maximal barrier sterile technique was utilized including caps, mask, sterile gowns, sterile gloves, sterile drape, hand hygiene and skin antiseptic. The skin was anesthetized with 1% lidocaine. Using ultrasound guidance, a 21 gauge needle was directed into the right common femoral artery and a micropuncture dilator set was placed. A 5 French vascular sheath was placed over a Bentson wire. The superior mesenteric artery was cannulated with a C2 catheter. Superior mesenteric arteriogram was performed. The C2 catheter was used to cannulate the celiac trunk. Celiac arteriography was performed. A Glidewire was advanced into the common hepatic artery and the C2 catheter was advanced into  the proximal common hepatic artery. At this point, there appeared to be extensive spasm in the common hepatic artery. C2 catheter was pulled back to the celiac trunk origin and additional angiography was performed. Multiple attempts were made to cannulate the common hepatic artery with a  Renegade micro catheter. Cannulating the common hepatic artery was very difficult with the micro catheter. As result, the C2 catheter was exchanged for a Sos catheter. Eventually, a glide micro wire was advanced into the common hepatic artery and proper hepatic artery. The Renegade catheter was advanced to the common hepatic artery and additional angiography was performed. Catheter was also advanced into the proper hepatic artery and additional angiography was performed. Multiple attempts were made to cannulate the gastroduodenal artery with a wire and catheter. These attempts at cannulating the gastroduodenal artery were unsuccessful. The micro catheter and 5 French catheter were removed. Final angiogram was performed through the right groin sheath. The groin sheath was removed with an Exoseal closure device. There was groin hemostasis at the end of the procedure.  COMPLICATIONS: None  FINDINGS: Superior mesenteric arteriogram: Patient has a metallic biliary stent and IVC filter. Patient has a large replaced right hepatic artery that originates near the origin of the superior mesenteric artery. There are small pancreatic-duodenal branches but no evidence for a vascular malformation or contrast extravasation. The delayed images demonstrate patency of the superior mesenteric veins but there is marked attenuation and narrowing of the portal confluence and main portal vein. The intrahepatic portal venous system appears to be patent.  Celiac arteriography: The splenic artery is large and patent. There is a prominent left gastric artery with filling of the right gastric artery. There is mild narrowing and irregularity at the common hepatic artery and proper hepatic artery. There appears to be irregularity of the gastroduodenal artery. There is narrowing involving the origin of the right hepatic artery. There is no evidence for acute extravasation. After a wire was advanced into the common hepatic artery, there was marked  narrowing of the common hepatic artery suggestive for vasospasm and minimal flow into the gastroduodenal artery. At one point, there appeared to be retrograde flow into the gastroduodenal artery. After the vasospasm resolved, the flow to the common hepatic artery and gastroduodenal artery returned to baseline.  IMPRESSION: Irregularity of the common hepatic artery, gastroduodenal artery and proper hepatic artery. These findings are related to the surrounding tumor burden. There was extensive vasospasm in the common hepatic artery during this procedure but no evidence for acute bleeding or contrast extravasation. The gastroduodenal artery could never be successfully cannulated and, therefore, an empiric gastroduodenal embolization could not be performed.  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   Electronically Signed   By: Richarda Overlie M.D.   On: 02/18/2013 10:29   Dg C-arm 1-60 Min-no Report  02/16/2013   CLINICAL DATA: stent placement   C-ARM 1-60 MINUTES  Fluoroscopy was utilized by the requesting physician.  No radiographic  interpretation.    Ir Rebekah [place] [facility] [employer] Guide Roadmapping  02/18/2013   INDICATION: History of metastatic pancreatic cancer, now with acute and recurrent upper GI bleeding from the duodenum based on recent endoscopy. Post failed attempted endoscopic duodenal stent placement and mesenteric arteriogram  EXAM: 1.  CELIAC ARTERIOGRAM  2.  COMMON HEPATIC ARTERIOGRAM (3RD ORDER).  3. SUB SELECTIVE GASTRODUODENAL ARTERIOGRAM (3RD ORDER) WITH PARTICLE AND COIL EMBOLIZATION  4.  ULTRASOUND GUIDANCE FOR ARTERIAL ACCESS  MEDICATIONS:  Nitroglycerin 100 mcg IA  TECHNIQUE: Informed written consent was obtained from the patient after a discussion of the risks, benefits and alternatives to treatment. Questions regarding the procedure were encouraged and answered. A timeout was performed prior to the initiation of the procedure.  The right  groin was prepped and draped in the usual sterile fashion, and a sterile drape was applied covering the operative field. Maximum barrier sterile technique with sterile gowns and gloves were used for the procedure. A timeout was performed prior to the initiation of the procedure. Local anesthesia was provided with 1% lidocaine.  The right femoral head was marked fluoroscopically. Under ultrasound guidance, the right common femoral artery was accessed with a micropuncture kit after the overlying soft tissues were anesthetized with 1% lidocaine. An ultrasound image was saved for documentation purposes. The micropuncture sheath was exchanged for a 5 [place] vascular sheath over a Bentson wire. A closure arteriogram was performed through the side of the sheath confirming access within the right common femoral artery.  Over a Bentson wire, a Mickelson catheter was advanced to the level of the thoracic aorta where it was back bled and flushed. The catheter was then utilized to select the celiac artery and a celiac arteriogram was performed. 100 mcg of nitroglycerin was not administered intra-arterially through the celiac arterial vascular distribution.  With the use of a Fathom 14 microwire, a regular Renegade microcatheter was advanced into the common hepatic artery and a common hepatic arteriogram was performed. The microcatheter was then advanced into the distal aspect of the gastroduodenal artery and a sub selective gastroduodenal arteriogram was performed.  Initially, the distal tributaries of the GDA were embolized with 700-900 micron Embospheres. Limited post particle embolization arteriogram was performed. Subsequently, the gastroduodenal artery was coil embolized to near its origin with a combination of overlapping 2 and 3 mm diameter interlock coils.  The micro catheter was retracted into the common hepatic artery and a repeat common hepatic arteriogram was performed. The micro catheter was removed and a post  embolization celiac arteriogram was performed. All images were reviewed and the procedure was terminated. All wires, catheters and sheaths were removed from the patient. Hemostasis was achieved at the right groin access site with manual compression.  ANESTHESIA/SEDATION: Fentanyl 200 mcg IV; Versed 4 mg IV  60 minutes  CONTRAST:  50 cc Omnipaque 300  COMPARISON:  None.  FLUOROSCOPY TIME:  11 min, 42 seconds  COMPLICATIONS: None immediate  FINDINGS: Celiac arteriogram demonstrates persistent irregular narrowing of both the common and proper hepatic arteries extending preferentially involve the origin of the right hepatic artery.  The gastroduodenal artery remains patent though irregularly narrowed throughout its course.  The gastroduodenal artery was successfully cannulated and embolized with a combination of 700 to 900 micron Embospheres and overlapping interlock coils to near its origin. 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  IMPRESSION: 1. Successful prophylactic particle and coil embolization of the gastroduodenal artery secondary to recurrent and refractory upper GI (duodenal) bleed due to malignant invasion from patient's known pancreatic head carcinoma. 2. While the common and proper hepatic arteries remain irregularly narrowed throughout their course, I do not feel that these vessels are contributing to the patient's current hemodynamic instability and as such were not embolized.  PLAN: Pending the patient's wishes, would recommend continued aggressive conservative management. 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   Electronically Signed   By: Simonne Come M.D.   On: 02/18/2013 13:58   Ct Angio Abd/pel W/ And/or W/o  02/23/2013   CLINICAL DATA:  GI bleed post embolization. Abdominal pain and distension.  EXAM: CT ANGIOGRAPHY ABDOMEN AND PELVIS  TECHNIQUE: Multidetector CT imaging of the abdomen and pelvis was performed using the standard protocol during bolus administration of intravenous contrast. Multiplanar reconstructed images including MIPs were obtained and reviewed to evaluate the vascular anatomy.  CONTRAST:  OMNIPAQUE IOHEXOL 350 MG/ML SOLN  COMPARISON:  02/10/2013  FINDINGS: ARTERIAL FINDINGS:  Aorta: Minimal atheromatous plaque in its infrarenal segment. No aneurysm, dissection, or stenosis.  Celiac axis: Widely patent. Tortuous splenic artery. Attenuated left hepatic artery secondary to tumor encasement. Interval coil embolization of gastroduodenal artery.  Superior mesenteric: Patent. Replaced right hepatic arterial supply, an anatomic variant, with irregular narrowing of the proximal right hepatic artery secondary to tumor encasement.  Left renal:           Single, patent.  Right renal:          Single, patent.  Inferior mesenteric:  Patent  Left iliac:           Unremarkable  Right iliac:          Unremarkable  Venous findings: Infrarenal IVC filter with some trapped thrombus, without evidence of caval occlusion. Patent bilateral renal veins. There is tumor encasement of the portosplenic confluence with obstruction of the central aspect of the splenic vein, central superior mesenteric vein, and proximal portal vein. There has been some cavernous transformation of the portal vein with collateral flow maintaining patency of the distal main and right portal vein. Left portal vein is thrombosed. DVT noted in the right femoral vein, incompletely visualized through its distal aspect.  Review of the MIP images confirms the above  findings.  Nonvascular findings: Platelike atelectasis posteriorly in the visualized lung bases. Small pleural effusions have developed. Progression of abdominal and pelvic ascites. There is no layering high attenuation component to suggest hemoperitoneum. Multiple low-attenuation hepatic lesions are again evident. There is a metallic stent through the common bile duct with left pneumobilia implying stent patency. Pancreatic head mass encases the common duct and vasculature as detailed above. Wedge-shaped enhancement defects in the spleen as before. Stable 13 mm low-attenuation left adrenal nodule. Unremarkable right adrenal gland.  New small perfusion defects in both kidneys involving the upper pole on the left, lower pole on the right. These persist on delayed scans. There is however normal excretion of contrast into decompressed renal collecting systems and proximal ureters. The stomach, small bowel, and colon are nondilated. There is wall thickening suspected in multiple loops of nondistended mid and distal ileum, cecum and proximal ascending colon. Urinary bladder incompletely distended. Stable left para-aortic adenopathy. Stable mild superior endplate compression deformity of L3. Partially calcified disc protrusions L3-4, L4-5, L5-S1. Facet degenerative hypertrophy L3-S1, right worse than left.  IMPRESSION: 1. No evidence of retroperitoneal or intra-abdominal acute hemorrhage post embolization of the gastroduodenal artery. 2. Interval increase in abdominal ascites. 3. New small bilateral pleural effusions. 4. Wedge-shaped peripheral enhancement abnormalities in bilateral kidneys, new since previous study, may represent arterial emboli less likely multifocal pyelonephritis. 5. Progressive wall thickening in  the distal jejunum and proximal ascending colon. Considerations include vascular insufficiency/ischemia versus colitis/enteritis. 6. Pancreatic mass with occlusion of the porto-splenic confluence and  proximal main portal vein, encasement and narrowing of right and left hepatic arteries as before. 7. Right lower extremity DVT with nonocclusive thrombus noted in the infrarenal IVC filter. 8. 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   Electronically Signed   By: Oley Balm M.D.   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 CT angiography is performed to assess vasculature prior to potential arteriography and transcatheter embolization to treat persistent bleeding.  EXAM: CT ANGIOGRAPHY ABDOMEN AND PELVIS WITH CONTRAST AND WITHOUT CONTRAST  TECHNIQUE: Multidetector CT imaging of the abdomen and pelvis was performed using the standard protocol during bolus administration of intravenous contrast. Multiplanar reconstructed images including MIPs were obtained and reviewed to evaluate the vascular anatomy.  CONTRAST:  OMNIPAQUE IOHEXOL 350 MG/ML SOLN  COMPARISON:  12/23/2012  FINDINGS: There is significant enlargement of the mass involving the head of the pancreas since the prior CT in October. Dimensions of the mass are now approximately 3.7 x 5.6 x 4.5 cm. The mass visibly encases the pre-existing biliary stent and also extends further to encase the gastroduodenal artery, portal venous confluence, hepatic artery and a segment of the superior mesenteric vein. 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 Portal flow is present in the right lobe.  Significant progression of metastatic disease in the liver is also identified in both left and right lobes. Index lesion in the inferior right lobe shows enlargement with dimensions of 2.5 x 3.5 cm (1.6 x 2.1 cm previously). The largest left  lobe lesion has enlarged from a maximum diameter of 2.4 cm previously to estimated current maximum diameter of 4.0 cm. All previously identified metastatic lesions have increased significantly in size. There are at least 4 new metastatic lesions identified in the liver. There is no evidence of associated biliary obstruction with air remaining a biliary tree. The common bile duct stent lumen does not appear to be compromised.  There is new ascites with mild to moderate overall volume of ascites present as well as nodularity of the mesenteric and omentum suspicious for carcinomatosis. No bowel obstruction or perforation is identified.  Arterial vasculature evaluated by CTA shows no evidence of significant occlusive disease. The aorta and major visceral branches are normally patent. At the level of the celiac trunk, the common hepatic artery is narrowed and encased by tumor but remains open with flow noted in both right and left hepatic arteries. The gastroduodenal artery is open but encased by tumor. Small distal GDA branches are present supplying the pancreatic mass. There also appears to be pancreaticoduodenal supply from the SMA at the level of the pancreatic mass.  The superior mesenteric artery abuts tumor but is not visibly narrowed. Focal nonocclusive thrombus or direct tumor invasion is identified in the superior aspect of the superior mesenteric vein causing luminal stenosis of approximately 60%. The splenic vein remains open.  Bilateral renal artery show normal patency. The inferior mesenteric vein is open. Bilateral iliac and common femoral arteries show normal patency.  Appearance of the gallbladder and kidneys are unremarkable. The spleen shows a new focal  wedge-shaped infarct in its posterior and inferior aspect. The rest of the spleen shows normal perfusion. There is a stable left adrenal nodule. Bony structures show degenerative changes of the spine without evidence of focal lesions.  Review of the MIP  images confirms the above findings.  IMPRESSION: 1. Significant progression of metastatic pancreatic carcinoma with enlargement of the mass at the level of the head of the pancreas and progression of hepatic metastatic disease. 2. 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 Tumor also encases and narrows the common hepatic artery, right and left hepatic arteries and gastroduodenal artery. There also is tumor involvement at the level of the superior mesenteric vein. 3. 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   Electronically Signed   By: Irish Lack M.D.   On: 02/11/2013 10:34    Medications: Scheduled Meds: . lipase/protease/amylase  2 capsule Oral TID AC & HS  . pantoprazole  80 mg Oral Daily  . sodium chloride  3 mL Intravenous Q12H  . sucralfate  2 g Oral Custom      LOS: 11 days   Chaya Jan M.D. [facility] 02/26/2013, 5:25 PM Pager: [phone]  If 7PM-7AM, please contact night-coverage [email or web address] Password [email or web address]

## 2013-02-26 NOTE — Progress Notes (Signed)
Patient completes 10 of 10 radiation treatments to abdomen.Denies pain or nausea.Tentative discharge tomorrow with hospice.Has one month follow up card.ct of head today indicates patient had small left cerebellar infarct.Speech muffled.

## 2013-02-26 NOTE — Progress Notes (Signed)
Clinical Social Work Department BRIEF PSYCHOSOCIAL ASSESSMENT 02/26/2013  Patient:  Austin Escobar, Austin Escobar     Account Number:  [id number]     Admit date:  02/15/2013  Clinical Social Worker:  Doree Albee  Date/Time:  02/26/2013 03:49 PM  Referred by:  RN  Date Referred:  02/26/2013 Referred for  Residential hospice placement   Other Referral:   Interview type:  Patient Other interview type:   patient brother    PSYCHOSOCIAL DATA Living Status:  ALONE Admitted from facility:   Level of care:   Primary support name:  Mahmoud Blazejewski Primary support relationship to patient:  SIBLING Degree of support available:   strong    CURRENT CONCERNS Current Concerns  Post-Acute Placement   Other Concerns:    SOCIAL WORK ASSESSMENT / PLAN CSW met with pt and pt brother at bedside to complete psychosocial assessment. Pt requested brother ot be present. Patient shared that he had a conversation wtih Dr. Twanna Hy, and was agreeable to hospice. Patient shared that he is interested in going home and haivng hospice follow pt at home. Pt brother shared he and his wife are able to provide 24 hour care. Patient family interested in hospice care of [place] and hospice of [facility]. Patient lives in [place], [place] a [place] resident.    Patient family to discuss and to inform RN of pt hospice agency choice later today or tomorrow morning.   Assessment/plan status:  Psychosocial Support/Ongoing Assessment of Needs Other assessment/ plan:   Information/referral to community resources:   hospice agency list    PATIENT'S/FAMILY'S RESPONSE TO PLAN OF CARE: Pt and pt brother thanked csw for concern and support. Pt is motviated to return home and is open to hospice services. Patient tearful however smiling. Patient brother stated he and hiw wife would do whatever they could for the patient including staying with patient around the clock. CSW informed RN CM.       Marland KitchenCatha Gosselin, [place] [phone]  ED CSW .02/26/2013 1553pm

## 2013-02-26 NOTE — Significant Event (Signed)
Rapid Response Event Note  Overview: Time Called: 0100 Arrival Time: 0105 Event Type: Neurologic  Initial Focused Assessment:Pt with expressive aphasia. Last seen normal at 2300. Pt woke and called RN due to inability to say the words he wanted to say. MAE and grips strong with right very slightly weaker. Tongue midline and PEARL. Gradually speech was slightly better but continues to have difficulty.   Interventions:Assessed, CT HEAD, LABS: PT,PTT,CMET,CBC. DR Roseanne Reno IN TO SEE PATIENT AT 0152   Event Summary: Name of Physician Notified: Lenny Pastel NP at 0045  Name of Consulting Physician Notified: Dr Roseanne Reno at 0115  Outcome: Stayed in room and stabalized  Event End Time: 0200  Bayard Hugger

## 2013-02-26 NOTE — Procedures (Signed)
US guided therapeutic paracentesis performed yielding 3.4 liters light yellow fluid. No immediate complications.

## 2013-02-26 NOTE — Progress Notes (Signed)
Unfortunately, Mr. Bhat now has had a CVA. This happened last night. On CT scan, he has an evolving infarct within the left cerebellar hemisphere.  This truly is not what he needs. We cannot treat this given that he has a GI bleeding.  I very much appreciate neurology seen him. However, I really do not see that we need to put him through an extensive workup given his very poor prognosis from his pancreatic cancer.  I did speak with him. I told him that was now happen clearly is reflective of his pancreatic cancer and the fact that as a hypercoaguable state.  I do not see that he is going to qualify for any clinical trial now.  For focus now must be on comfort. He understands this. I spoke to him about hospice. He will clearly need this upon discharge.  He presented and a half some more GI bleeding. His hemoglobin is 9.2. Platelet count is 87,000. His coags looked fine.  His vital signs are stable. Blood pressure is 118/74. He doesn't have some difficulty with speech. There is some weakness on his right side. His lungs are clear. Cardiac exam regular rate rhythm. Abdomen is slightly distended. There is a fluid wave. Extremities shows continue muscle atrophy.  At this point, our goal him primarily must be comfort. I just do not see that  an extensive workup for this stroke is going to change our management.  We will go ahead and get palliative care involved so we can begin to make arrangements for supportive measures upon discharge.  Given the events, I would be very surprised if he made it through January.  I do appreciate everybody's help. Again, we need to focus on his comfort and respect and dignity.  Pete E.  Hebrews 12:15

## 2013-02-26 NOTE — Progress Notes (Signed)
Report from night shift RN that she found patient w/knife(switch blade type)on bed-closed. Patient stated he used it to "cut pills". Also found empty 50ml plastic Spiced Rum bottle at bedside. Patient denied drinking Rum while in Hospital. Security called & has possession of knife. Empty Rum Bottle taken by RN & placed in Medication drawer if needed.Austin Escobar

## 2013-02-26 NOTE — Progress Notes (Signed)
[facility] Radiation Oncology Dept Therapy Treatment Record Phone [phone]   Radiation Therapy was administered to Austin Escobar on: 02/26/2013  5:46 PM and was treatment # 10 out of a planned course of 10 treatments.

## 2013-02-26 NOTE — Progress Notes (Signed)
Pt called RN to his room ,was unable to say the words  he wanted to say ,neuro checks done,pt 's grip was strong but unable to get words out,vital signs WNL.  MD notified , rapid response and code stroke was initiated ,orders received for stat  CT scan of the head.

## 2013-02-26 NOTE — Progress Notes (Signed)
Follow up note to 0730 note by this RN. Discussed Pt's use of knife w/brother. Pt. Does use knife to open Creon caps- unable to swallow capsule. Observed Pt's brother opening Creon capsule for Pt. W/knife. Received knife from security & given to brother. Rum bottle was in bag used to bring pt items from home. Family & Pt unaware that bottle was present in bag. There was no odor of Rum in room this AM.Tasmia Blumer, Ricci Barker

## 2013-02-26 NOTE — Progress Notes (Signed)
[facility] progress note. Chief complaint. Dysarthria and word finding difficulty History of present. This 57 year old male with a history of prior atrial fib status post ablation, metastatic pancreatic and liver cancer, central pulmonary emboli 10/22 previously on Lovenox admitted this hospitalization with a GI bleed. The patient is not on any anticoagulants currently due to GI bleed. He was noted by the nursing staff to be at baseline approximately one hour ago. He went to the bathroom and found that he had difficulty expressing himself and finding words afterwards. He notified nursing and I was then notified and came to the bedside. I found the patient dysarthric with some slurring of his words and clear word finding difficulty. Patient indicated that this has improved somewhat since I am seeing him. Code stroke was initiated and the patient was taken down for stat CT scan of the head without contrast. Vital signs temperature 98.4, pulse 95, respiration 20, blood pressure 123/76. O2 sats 97%. General appearance. Well-developed middle-aged male who is alert, cooperative, and in no distress. Cardiac. Rate and rhythm regular. Lungs. Breath sounds are clear and equal. Abdomen. Soft with positive bowel sounds. No pain. Neurologic. Speech somewhat slurred and the patient is clearly having difficulty finding appropriate words. No other unilateral or focal defects noted. Impression/plan. Problem #1. Rule out CVA/TIA. The fact that word finding difficulty and slurred speech appear to be improving is suggestive of TIA. Stat CT scan of the brain is pending result. Also added CBC without differential, comprehensive metabolic panel, PTT/PT/INR stat labs. I did discuss the case with Doctor Roseanne Reno neuro hospitalist who has kindly consented to see the patient as soon as possible this a.m.

## 2013-02-26 NOTE — Consult Note (Signed)
Referring Physician:  Dr. Fabio Bering    Chief Complaint: New-onset speech difficulty and right upper extremity weakness.  HPI: Austin Escobar is an 57 y.o. male with a history of atrial fibrillation, hypertension and pancreatic cancer, who developed acute onset of speech difficulty and right upper extremity weakness at 12:30 AM this morning. He has no previous history of stroke nor TIA. He has not been on antiplatelet therapy. He had a recent gastrointestinal hemorrhage, as well as DVT and pulmonary embolus. He has not been on anticoagulation because of recent GI hemorrhage. CT scan of his head showed a small acute stroke involving the left cerebellar hemisphere. There is no evidence of hemorrhage. NIH stroke score was 2. Patient was not a candidate for TPA because of recent GI hemorrhage and minimal residual deficits.  LSN: 30 a.m. on 02/26/2013 tPA Given: No: Recent GI hemorrhage; minimal residual deficits. MRankin: 1  Past Medical History  Diagnosis Date  . Cellulitis and abscess of trunk   . CELLULITIS, RIGHT LEG   . LOW BACK PAIN   . HTN (hypertension)   . Personal history of colonic adenomas 04/29/2012  . Afib   . ED (erectile dysfunction)   . Asthma     as a child- no problems after childhood  . Pancreatic mass 06/25/2012    EUS  . Diverticulosis   . 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 Pancreatic cancer dx'd 06/03/12  . Testicular cancer     Family History  Problem Relation Age of Onset  . Hyperlipidemia Father   . Coronary artery disease Father   . Stroke Father   . Diabetes Father   . Liver disease Father     cirrhosis  . Kidney disease Father   . Colon cancer Neg Hx   . Esophageal cancer Neg Hx   . Rectal cancer Neg Hx   . Stomach cancer Neg Hx      Medications: I have reviewed the patient's current medications.  ROS: History obtained from chart review and the patient  General ROS: negative for - chills, fatigue, fever, night sweats, weight gain or  weight loss Psychological ROS: negative for - behavioral disorder, hallucinations, memory difficulties, mood swings or suicidal ideation Ophthalmic ROS: negative for - blurry vision, double vision, eye pain or loss of vision ENT ROS: negative for - epistaxis, nasal discharge, oral lesions, sore throat, tinnitus or vertigo Allergy and Immunology ROS: negative for - hives or itchy/watery eyes Hematological and Lymphatic ROS: DVT with pulmonary embolus Endocrine ROS: negative for - galactorrhea, hair pattern changes, polydipsia/polyuria or temperature intolerance Respiratory ROS: negative for - cough, hemoptysis, shortness of breath or wheezing Cardiovascular ROS: negative for - chest pain, dyspnea on exertion, edema or irregular heartbeat Gastrointestinal ROS: Positive for recent gastrointestinal hemorrhage Genito-Urinary ROS: negative for - dysuria, hematuria, incontinence or urinary frequency/urgency Musculoskeletal ROS: negative for - joint swelling or muscular weakness Neurological ROS: as noted in HPI Dermatological ROS: negative for rash and skin lesion changes  Physical Examination: Blood pressure 126/73, pulse 95, temperature 98.4 F (36.9 C), temperature source Oral, resp. rate 18, height 6\' 1"  (1.854 m), weight 99.066 kg (218 lb 6.4 oz), SpO2 96.00%.  Neurologic Examination: Mental Status: Alert, oriented, thought content appropriate.  Patient had minimal word finding difficulty. Able to follow commands without difficulty. Cranial Nerves: II-Visual fields were normal. III/IV/VI-Pupils were equal and reacted. Extraocular movements were full and conjugate.    V/VII-no facial numbness and no facial weakness. VIII-normal. X-minimal  dysarthria; symmetrical palatal movement. Motor: 5/5 bilaterally with normal tone and bulk Sensory: Normal throughout. Deep Tendon Reflexes: 1+ and symmetric. Plantars: Mute bilaterally Cerebellar: Normal finger-to-nose testing.  Ct Head Wo  Contrast  02/26/2013   CLINICAL DATA:  Dysarthria.  Assess for CVA.  EXAM: CT HEAD WITHOUT CONTRAST  TECHNIQUE: Contiguous axial images were obtained from the base of the skull through the vertex without intravenous contrast.  COMPARISON:  CT of the cervical spine performed 10/25/2005  FINDINGS: There is a small evolving relatively acute infarct within the left cerebellar hemisphere, without evidence of midline shift. There is no evidence of hemorrhagic transformation at this time. An underlying mass is considered less likely, given the appearance.  The posterior fossa, including the cerebellum, brainstem and fourth ventricle, is within normal limits. The third and lateral ventricles, and basal ganglia are unremarkable in appearance. The cerebral hemispheres are symmetric in appearance, with normal gray-white differentiation. No mass effect or midline shift is seen.  There is no evidence of fracture; visualized osseous structures are unremarkable in appearance. The orbits are within normal limits. The paranasal sinuses and mastoid air cells are well-aerated. No significant soft tissue abnormalities are seen.  IMPRESSION: Small relatively acute evolving infarct within the left cerebellar hemisphere, without evidence of hemorrhagic transformation at this time. No evidence of midline shift. An underlying mass is considered less likely, given the appearance, but cannot be excluded.  These results were called by telephone at the time of interpretation on 02/26/2013 at 1:37 AM to [facility] RN on [id number], who verbally acknowledged these results.   Electronically Signed   By: Roanna Raider M.D.   On: 02/26/2013 01:41   US Paracentesis  02/24/2013   CLINICAL DATA:  Abdominal distention, ascites. Request diagnostic and therapeutic paracentesis.  EXAM: ULTRASOUND GUIDED PARACENTESIS  COMPARISON:  None  PROCEDURE: An ultrasound guided paracentesis was thoroughly discussed with the patient and questions answered. The benefits,  risks, alternatives and complications were also discussed. The patient understands and wishes to proceed with the procedure. Written consent was obtained.  Ultrasound was performed to localize and mark an adequate pocket of fluid in the right lower quadrant of the abdomen. The area was then prepped and draped in the normal sterile fashion. 1% Lidocaine was used for local anesthesia. Under ultrasound guidance a 19 gauge Yueh catheter was introduced. Paracentesis was performed. The catheter was removed and a dressing applied.  COMPLICATIONS: None immediate  FINDINGS: A total of approximately 5.6 L of clear yellow fluid was removed. A fluid sample was sent for laboratory analysis.  IMPRESSION: Successful ultrasound guided paracentesis yielding 5.6 L of ascites.  Read by: Brayton El PA-C   Electronically Signed   By: Maryclare Bean M.D.   On: 02/24/2013 09:29    Assessment: 57 y.o. male with multiple risk factors for stroke, as well as pancreatic cancer and possible hypercoagulable state, with new small non-hemorrhagic left cerebellar hemispheric stroke.  Stroke Risk Factors - atrial fibrillation, family history and hypertension  Plan: 1. HgbA1c, fasting lipid panel 2. MRI, MRA  of the brain without contrast 3. PT consult, OT consult, Speech consult 4. Echocardiogram 5. Carotid dopplers 6. Prophylactic therapy-None 7. Risk factor modification 8. Telemetry monitoring   C.R. Roseanne Reno, MD [facility] [phone]  02/26/2013, 2:11 AM

## 2013-02-26 NOTE — Progress Notes (Signed)
Stroke Team Progress Note  HISTORY Austin Escobar is a 57 y.o. male with a history of atrial fibrillation, hypertension and pancreatic cancer, who developed acute onset of speech difficulty and right upper extremity weakness at 12:30 AM on 02/26/2013. He has no previous history of stroke nor TIA. He has not been on antiplatelet therapy. He had a recent gastrointestinal hemorrhage, as well as DVT and pulmonary embolus. He has not been on anticoagulation because of recent GI hemorrhage. CT scan of his head showed a small acute stroke involving the left cerebellar hemisphere. There is no evidence of hemorrhage. NIH stroke score was 2. Patient was not a candidate for TPA because of recent GI hemorrhage and minimal residual deficits.  LSN: 30 a.m. on 02/26/2013  tPA Given: No: Recent GI hemorrhage; minimal residual deficits.  MRankin: 1  SUBJECTIVE Pt feels back to baseline except for ocassional speech problems - word finding.  OBJECTIVE Most recent Vital Signs: Filed Vitals:   02/26/13 0105 02/26/13 0110 02/26/13 0125 02/26/13 0444  BP: 114/77 122/79 126/73 118/74  Pulse:    85  Temp:    98.2 F (36.8 C)  TempSrc:    Oral  Resp: 14 18 18 16   Height:      Weight:      SpO2: 96% 94% 96% 98%   CBG (last 3)  No results found for this basename: GLUCAP,  in the last 72 hours  IV Fluid Intake:     MEDICATIONS  . lipase/protease/amylase  2 capsule Oral TID AC & HS  . pantoprazole  80 mg Oral Daily  . sodium chloride  3 mL Intravenous Q12H  . sucralfate  2 g Oral Custom   PRN:  feeding supplement (ENSURE COMPLETE), loperamide, LORazepam, morphine injection  Diet:  General thin liquids Activity:  Up with assistance DVT Prophylaxis:  SCDs  CLINICALLY SIGNIFICANT STUDIES Basic Metabolic Panel:   Recent Labs Lab 02/22/13 0925 02/26/13 0130  NA 137 135  K 3.2* 3.8  CL 108 104  CO2 20 21  GLUCOSE 175* 125*  BUN 9 12  CREATININE 0.83 0.86  CALCIUM 8.4 8.5   Liver Function Tests:    Recent Labs Lab 02/22/13 0925 02/26/13 0130  AST 28 23  ALT 24 17  ALKPHOS 88 94  BILITOT 0.5 0.4  PROT 5.4* 5.3*  ALBUMIN 2.2* 2.2*   CBC:   Recent Labs Lab 02/25/13 0540 02/26/13 0130  WBC 6.9 9.7  HGB 8.9* 9.2*  HCT 27.2* 27.4*  MCV 90.1 90.1  PLT 66* 87*   Coagulation:   Recent Labs Lab 02/20/13 1001 02/22/13 0925 02/26/13 0130  LABPROT 17.5* 17.4* 16.1*  INR 1.48 1.47 1.32   Cardiac Enzymes: No results found for this basename: CKTOTAL, CKMB, CKMBINDEX, TROPONINI,  in the last 168 hours Urinalysis: No results found for this basename: COLORURINE, APPERANCEUR, LABSPEC, PHURINE, GLUCOSEU, HGBUR, BILIRUBINUR, KETONESUR, PROTEINUR, UROBILINOGEN, NITRITE, LEUKOCYTESUR,  in the last 168 hours Lipid Panel No results found for this basename: chol,  trig,  hdl,  cholhdl,  vldl,  ldlcalc   HgbA1C  No results found for this basename: HGBA1C    Urine Drug Screen:   No results found for this basename: labopia,  cocainscrnur,  labbenz,  amphetmu,  thcu,  labbarb    Alcohol Level: No results found for this basename: ETH,  in the last 168 hours  Ct Head Wo Contrast 02/26/2013    Small relatively acute evolving infarct within the left cerebellar hemisphere, without evidence of  hemorrhagic transformation at this time. No evidence of midline shift. An underlying mass is considered less likely, given the appearance, but cannot be excluded.    US Paracentesis 02/24/2013    Successful ultrasound guided paracentesis yielding 5.6 L of ascites.    MRI of the brain    MRA of the brain    2D Echocardiogram   12/23/2012 EF 60 - 65 % No cardiac source of emboli identified.  Carotid Doppler    CXR    EKG  02/10/2013  ST rate 109 BPM  Therapy Recommendations Therapy has not been ordered.  Physical Exam   Neurologic Examination:  Mental Status:  Alert, oriented, thought content appropriate. Patient had minimal word finding difficulty. Mild dysarthria. Able to follow  commands without difficulty.  Cranial Nerves:  II-Visual fields were normal.  III/IV/VI-Pupils were equal and reacted. Extraocular movements were full and conjugate.  V/VII-no facial numbness and no facial weakness.  VIII-normal.  X-minimal dysarthria; symmetrical palatal movement.  Motor: 5/5 bilaterally with normal tone and bulk  Sensory: Normal throughout.  Deep Tendon Reflexes: 1+ and symmetric.  Plantars: Mute bilaterally  Cerebellar: Normal finger-to-nose testing on left, inpaired on right   ASSESSMENT [name] is a 57 y.o. male presenting with speech difficulties and right upper extremity weakness. TPA not given secondary to minimal deficits and hx of recent GI bleed. Imaging confirms a small relatively acute evolving infarct within the left cerebellar hemisphere. Infarct felt to be embolic secondary to Afib.  On no antithrombotics prior to admission. Now on no antithrombotics for secondary stroke prevention. Patient with resultant resolution of deficits except for mild speech difficulties. Extensive workup not planned.   Pancreatic cancer / testicular cancer  Hx of GI bleed and anemia  Htn hx  DVT / PE - IVC filter  Hospital day # 11  TREATMENT/PLAN  Continue no antithrombotics for secondary stroke prevention.  Extensive workup not recommended by Oncology secondary to poor prognosis.  Delton See PA-C [facility] Pager [phone] 02/26/2013, 7:59 AM  I have personally obtained a history, examined the patient, evaluated imaging results, and formulated the assessment and plan of care. I agree with the above. 57 year old male with cerebellar infarct the either secondary to atrial fibrillation or hypercoagulable state from his cancer. Either way, anticoagulation is unfortunately not an option given his recent GI bleeding. As per oncology, his prognosis is poor and Dr. [name] is recommending hospice.  In the setting I agree with not  performing any further evaluation.  PT may be helpful. Neurology will sign off at this time, please call with any further questions or concerns.  Ritta Slot, MD [facility] [phone]  If 7pm- 7am, please page neurology on call at [phone].

## 2013-02-26 NOTE — Progress Notes (Signed)
  Radiation Oncology         [phone] ________________________________  Name: Austin Escobar MRN: [id number]  Date: 02/26/2013  DOB: 1955/11/18  Weekly Radiation Therapy Management  Current Dose: 30 Gy     Planned Dose:  30 Gy  Narrative . . . . . . . . The patient presents for the final under treatment assessment.                                                           Set-up films were reviewed.                                 The chart was checked. Physical Findings. . . Weight essentially stable.  No significant changes. Impression . . . . . . . The patient tolerated radiation relatively well. Plan . . . . . . . . . . . . Complete radiation today as scheduled, and follow-up with Hospice. The patient was encouraged to call or return to the clinic for any worsening symptoms.  ________________________________  [name] Kathrynn Running, M.D.

## 2013-02-26 NOTE — Care Management Note (Signed)
Per pt disposition is home with hospice services. Pt provided choice list for [facility].Per pt and family choice [facility] to provide Home Hospice Care. [facility] liaison Valente David notified. Cm instructed to fax referral to [facility] at [phone]. [facility] liaison to consult pt and family at bedside.     Roxy Manns Vinisha Faxon,MSN,RN [phone]

## 2013-02-26 NOTE — Progress Notes (Signed)
Patient WU:[id number] L Castoro      DOB: April 05, 1955      JYN:[id number]  Consult received. Patient at radiation when I arrived to see.  Will reattempt in am.  Kamiyah Kindel L. Ladona Ridgel, MD MBA The Palliative Medicine Team at [facility] Phone: [phone] Pager: [phone]

## 2013-02-26 NOTE — Progress Notes (Signed)
Notified by [facility], patient and family request services of [facility] of [facility]) after discharge. Discharge date to be determined  Spoke with patient, brother, Jonny Ruiz and friend Jillyn Hidden at bedside to initiate education related to hospice services, philosophy and team approach to care; patient and brother voiced good understanding of information provided.  Per chart review and discussion with patient, given recent events-acute embolic infarct, there are no plans for aggressive or additional treatments Per discussion plan is to d/c to his home via personal vehicle, brother Jonny Ruiz to transport- discharge possible this weekend;  *Please send completed out of facility GOLD DNR form home with patient *Should patient discharge over weekend [facility] on-call will need to be notified when patient is ready to leave the hospital *call [phone]  DME needs discussed - at this time there are no equipment needs; patient/brother has equipment list and will follow-up with home care team for future needs Attending physician with [facility] will be Dr [name]; pharmacy [facility] pharmacy [phone] Initial paperwork faxed to [facility]  * Completed d/c summary will need to be faxed to [facility] @ [phone] when final Please notify [facility] when patient is ready to leave unit at d/c call [phone];  [facility] information and contact numbers also given to brother Jonny Ruiz during visit.   As this discussion took place after 5:00pm above information shared with staff RN Silva Bandy Please call with any questions or concerns   Valente David, RN 02/26/2013, 5:33 PM [facility] RN Liaison [phone]

## 2013-02-27 DIAGNOSIS — Z86711 Personal history of pulmonary embolism: Secondary | ICD-10-CM

## 2013-02-27 DIAGNOSIS — I6350 Cerebral infarction due to unspecified occlusion or stenosis of unspecified cerebral artery: Secondary | ICD-10-CM

## 2013-02-27 DIAGNOSIS — C259 Malignant neoplasm of pancreas, unspecified: Secondary | ICD-10-CM

## 2013-02-27 LAB — CBC
Hemoglobin: 8.4 g/dL — ABNORMAL LOW (ref 13.0–17.0)
RBC: 2.8 MIL/uL — ABNORMAL LOW (ref 4.22–5.81)
WBC: 7.1 10*3/uL (ref 4.0–10.5)

## 2013-02-27 MED ORDER — LORAZEPAM 1 MG PO TABS: 1.0000 mg | ORAL_TABLET | Freq: Four times a day (QID) | ORAL | Status: AC | PRN

## 2013-02-27 MED ORDER — HEPARIN SOD (PORK) LOCK FLUSH 100 UNIT/ML IV SOLN
500.0000 [IU] | INTRAVENOUS | Status: DC | PRN
  Filled 2013-02-27: qty 5

## 2013-02-27 MED ORDER — OXYCODONE HCL 20 MG/ML PO CONC: 6.0000 mg | ORAL | Status: AC | PRN

## 2013-02-27 MED ORDER — SUCRALFATE 1 GM/10ML PO SUSP: 2.0000 g | Freq: Four times a day (QID) | ORAL | Status: AC

## 2013-02-27 NOTE — Progress Notes (Signed)
Patient was stable at time of discharge. Reviewed discharge education with patient and family. They verbalized understanding. Gave family patient's DNR form and prescriptions.

## 2013-02-27 NOTE — Discharge Summary (Signed)
Physician Discharge Summary  Austin Escobar HQI:[id number] DOB: [birth year] DOA: 02/15/2013  PCP: [name], DO  Admit date: 02/15/2013 Discharge date: 02/27/2013  Time spent: 45 minutes  Recommendations for Outpatient Follow-up:  -Will be discharged home today with hospice care.   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 WM PMHx Severe diverticulosis+ multiple colon polyps (by colonoscopy 04/22/2012), seminoma right testicle, asthma, metastatic pancreatic cancer Dx April 2014 (completed chemotherapy) (oncologist Dr.Iven [name]), bile duct stricture, secondary polycythemia, saddle pulmonary embolus Dx 10/23 2014, atrial fibrillation, HTN. States 2 months ago after failing several rounds of chemotherapy Dr. [name] released patient to seek treatment at [facility] at a clinical trial by Dr. [name] (oncologist). States starting Friday began to notice dark-colored stool which she attributed to the medial heat and. By Sunday he was seeing [facility]. States began to feel positive fatigue positive lightheadedness and decided to count to [facility]. At the ED it was discovered patient's hemoglobin= 6.9. Patient states his hemoglobin normally ranges 14-16. Patient states he is full code, and does not have a healthcare power of attorney. Patient admitted for definitive care of GI bleed. Hospitalist admission was requested.   Hospital Course:    Cerebellar CVA  -Unfortunately last pm (12/25) had an episode of word-finding difficulty and a CT Head showed evidence for an acute cerebellar CVA.  -Aggressive work up not to be performed given his limited prognosis from his pancreatic cancer.  -No anticoagulation given extensive GI bleed this admission.   Acute blood loss anemia  -Has received 9 units PRBCs during this admission; 2units FFP; 1 unit of platelets.  -Underwent embolization 12/18 of the GDA by IR 12/18.  -appreciate GI and IR followup  -Avoid NSAIDs  -Hemoglobin has remained fairly stable. Patient is hemodynamically stable , hemoglobin 8.9 today  -Tolerating a regular diet  -origin of bleed likely from duodenal bulb where there is tumor invasion  -CT abdomen showed no evidence of retroperitoneal or intra-abdominal acute hemorrhage post embolization of the gastroduodenal artery, interval increase in ascites   GI bleed  -As above.   Ascites  - Ultrasound guided paracentesis 12/24 with 5.6 L out  -Had a repeat paracentesis on 12.26 with 3.4 L out.   Coagulopathy  -Received 2 units FFP 02/16/2013 when his INR was 1.7  -vitamin K given 12/18   Thrombocytopenia  -Received 1 unit of platelets on 12/21.   Esophageal varices  -No bleeding noted from previous endoscopy  -Continue Protonix and Carafate   Saddle pulmonary embolus  -12/23/2012  -Status post IVC filter in December 2014  -Currently stable without any respiratory distress or hypoxemia   Atrial fibrillation  -Status post ablation 05/25/2012  -Currently in sinus rhythm  -Not a candidate for anticoagulation   Metastatic pancreatic adenocarcinoma with duodenal invasion  -Appreciate oncology followup  -continue radiation therapy.  -Dr. [name] is following  HTN  -Metoprolol tartrate is on hold due to soft blood pressures     Consultations:  Oncology  IR  GI  Discharge Instructions  Discharge Orders   Future Appointments Provider  Department Dept Phone   03/01/2013 2:40 PM [facility] [phone]   04/01/2013 11:15 AM Jonna Coup, MD [facility] [phone]   Future Orders Complete By Expires   Discontinue IV  As directed    Increase activity slowly  As directed        Medication List    STOP taking these medications       bifidobacterium infantis capsule     diphenoxylate-atropine 2.5-0.025 MG per tablet  Commonly known as:  LOMOTIL     folic acid 1 MG tablet  Commonly known as:  FOLVITE     hyoscyamine 0.125 MG tablet  Commonly known as:  LEVSIN, ANASPAZ     metoprolol tartrate 12.5 mg Tabs tablet  Commonly known as:  LOPRESSOR      TAKE these medications       lipase/protease/amylase 46962 UNITS Cpep capsule  Commonly known as:  CREON-12/PANCREASE  Take 2 capsules by mouth 4 (four) times daily -  before meals and at bedtime.     LORazepam 1 MG tablet  Commonly known as:  ATIVAN  Take 1 tablet (1 mg total) by mouth every 6 (six) hours as needed (sleep).     oxyCODONE 100 MG/5ML concentrated solution  Commonly known as:  ROXICODONE INTENSOL  Take 0.3-0.6 mLs (6-12 mg total) by mouth every 4 (four) hours as needed for severe pain.     pantoprazole 40 MG tablet  Commonly known as:  PROTONIX  Take 1 tablet (40 mg total) by mouth 2 (two) times daily.     sucralfate 1 GM/10ML suspension  Commonly known as:  CARAFATE  Take 20 mLs (2 g total) by mouth 4 (four) times daily.       No Known Allergies     Follow-up Information   Follow up with [facility] ([facility] to follow aftr d/c pls notify whn pt ready to leave hospital call [phone])    Contact information:   [facility] [address] [place] [phone]       The results of significant diagnostics from this hospitalization (including imaging, microbiology, ancillary and laboratory) are listed below for  reference.    Significant Diagnostic Studies: Ct Head Wo Contrast  02/26/2013   CLINICAL DATA:  Dysarthria.  Assess for CVA.  EXAM: CT HEAD WITHOUT CONTRAST  TECHNIQUE: Contiguous axial images were obtained from the base of the skull through the vertex without intravenous contrast.  COMPARISON:  CT of the cervical spine performed 10/25/2005  FINDINGS: There is a small evolving relatively acute infarct within the left cerebellar hemisphere, without evidence of midline shift. There is no evidence of hemorrhagic transformation at this time. An underlying mass is considered less likely, given the appearance.  The posterior fossa, including the cerebellum, brainstem and fourth ventricle, is within normal limits. The third and lateral ventricles, and basal ganglia are unremarkable in appearance. The cerebral hemispheres are symmetric in appearance, with normal gray-white differentiation. No mass effect or midline shift is seen.  There is no evidence of fracture; visualized osseous structures are unremarkable in appearance. The orbits are within normal limits. The paranasal sinuses and mastoid air cells are well-aerated. No significant soft tissue abnormalities are seen.  IMPRESSION: Small  relatively acute evolving infarct within the left cerebellar hemisphere, without evidence of hemorrhagic transformation at this time. No evidence of midline shift. An underlying mass is considered less likely, given the appearance, but cannot be excluded.  These results were called by telephone at the time of interpretation on 02/26/2013 at 1:37 AM to [facility] RN on [id number], who verbally acknowledged these results.   Electronically Signed   By: Roanna Raider M.D.   On: 02/26/2013 01:41   Ir Angiogram Visceral Selective  02/18/2013   INDICATION: History of metastatic pancreatic cancer, now with acute and recurrent upper GI bleeding from the duodenum based on recent endoscopy. Post failed attempted endoscopic duodenal stent placement  and mesenteric arteriogram  EXAM: 1.  CELIAC ARTERIOGRAM  2.  COMMON HEPATIC ARTERIOGRAM (3RD ORDER).  3. SUB SELECTIVE GASTRODUODENAL ARTERIOGRAM (3RD ORDER) WITH PARTICLE AND COIL EMBOLIZATION  4.  ULTRASOUND GUIDANCE FOR ARTERIAL ACCESS  MEDICATIONS: Nitroglycerin 100 mcg IA  TECHNIQUE: Informed written consent was obtained from the patient after a discussion of the risks, benefits and alternatives to treatment. Questions regarding the procedure were encouraged and answered. A timeout was performed prior to the initiation of the procedure.  The right groin was prepped and draped in the usual sterile fashion, and a sterile drape was applied covering the operative field. Maximum barrier sterile technique with sterile gowns and gloves were used for the procedure. A timeout was performed prior to the initiation of the procedure. Local anesthesia was provided with 1% lidocaine.  The right femoral head was marked fluoroscopically. Under ultrasound guidance, the right common femoral artery was accessed with a micropuncture kit after the overlying soft tissues were anesthetized with 1% lidocaine. An ultrasound image was saved for documentation purposes. The micropuncture sheath was exchanged for a 5 [place] vascular sheath over a Bentson wire. A closure arteriogram was performed through the side of the sheath confirming access within the right common femoral artery.  Over a Bentson wire, a Mickelson catheter was advanced to the level of the thoracic aorta where it was back bled and flushed. The catheter was then utilized to select the celiac artery and a celiac arteriogram was performed. 100 mcg of nitroglycerin was not administered intra-arterially through the celiac arterial vascular distribution.  With the use of a Fathom 14 microwire, a regular Renegade microcatheter was advanced into the common hepatic artery and a common hepatic arteriogram was performed. The microcatheter was then advanced into the distal aspect of  the gastroduodenal artery and a sub selective gastroduodenal arteriogram was performed.  Initially, the distal tributaries of the GDA were embolized with 700-900 micron Embospheres. Limited post particle embolization arteriogram was performed. Subsequently, the gastroduodenal artery was coil embolized to near its origin with a combination of overlapping 2 and 3 mm diameter interlock coils.  The micro catheter was retracted into the common hepatic artery and a repeat common hepatic arteriogram was performed. The micro catheter was removed and a post embolization celiac arteriogram was performed. All images were reviewed and the procedure was terminated. All wires, catheters and sheaths were removed from the patient. Hemostasis was achieved at the right groin access site with manual compression.  ANESTHESIA/SEDATION: Fentanyl 200 mcg IV; Versed 4 mg IV  60 minutes  CONTRAST:  50 cc Omnipaque 300  COMPARISON:  None.  FLUOROSCOPY TIME:  11 min, 42 seconds  COMPLICATIONS: None immediate  FINDINGS: Celiac arteriogram demonstrates persistent irregular narrowing of both the common and proper hepatic arteries extending preferentially involve the origin of the  right hepatic artery.  The gastroduodenal artery remains patent though irregularly narrowed throughout its course.  The gastroduodenal artery was successfully cannulated and embolized with a combination of 700 to 900 micron Embospheres and overlapping interlock coils to near its origin. 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  IMPRESSION: 1. Successful prophylactic particle and coil embolization of the gastroduodenal artery secondary to recurrent and refractory upper GI (duodenal) bleed due to malignant invasion from patient's known pancreatic head carcinoma. 2. While the common and proper hepatic arteries remain irregularly narrowed throughout their course, I do not feel that these vessels are contributing to the  patient's current hemodynamic instability and as such were not embolized.  PLAN: Pending the patient's wishes, would recommend continued aggressive conservative management. 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   Electronically Signed   By: Simonne Come M.D.   On: 02/18/2013 13:58   Ir Angiogram Visceral Selective  02/18/2013   CLINICAL DATA:  57 year old with metastatic pancreatic cancer. Acute and recurrent upper GI bleeding. The patient has had previous duodenal bleeding based on endoscopy.  EXAM: VISCERAL ARTERIOGRAPHY ; SELECTIVE VISCERAL ARTERIOGRAPHY ; ULTRASOUND GUIDANCE FOR VASCULAR ACCESS  Physician: Rachelle Hora. Henn, MD  MEDICATIONS: Versed 5 mg, fentanyl 100 mcg. A radiology nurse monitored the patient for moderate sedation.  ANESTHESIA/SEDATION: Moderate sedation time: 1 hr and 45 min  CONTRAST:  85 mL Omnipaque 300  FLUOROSCOPY TIME:  29 min and 30 seconds  PROCEDURE: The procedure was explained to the patient. The risks and benefits of the procedure were discussed and the patient's questions were addressed. Informed consent was obtained from the patient. Patient was brought to the interventional suite. The right groin was prepped and draped in sterile fashion. Maximal barrier sterile technique was utilized including caps, mask, sterile gowns, sterile gloves, sterile drape, hand hygiene and skin antiseptic. The skin was anesthetized with 1% lidocaine. Using ultrasound guidance, a 21 gauge needle was directed into the right common femoral artery and a micropuncture dilator set was placed. A  5 French vascular sheath was placed over a Bentson wire. The superior mesenteric artery was cannulated with a C2 catheter. Superior mesenteric arteriogram was performed. The C2 catheter was used to cannulate the celiac trunk. Celiac arteriography was performed. A Glidewire was advanced into the common hepatic artery and the C2 catheter was advanced into the proximal common hepatic artery. At this point, there appeared to be extensive spasm in the common hepatic artery. C2 catheter was pulled back to the celiac trunk origin and additional angiography was performed. Multiple attempts were made to cannulate the common hepatic artery with a Renegade micro catheter. Cannulating the common hepatic artery was very difficult with the micro catheter. As result, the C2 catheter was exchanged for a Sos catheter. Eventually, a glide micro wire was advanced into the common hepatic artery and proper hepatic artery. The Renegade catheter was advanced to the common hepatic artery and additional angiography was performed. Catheter was also advanced into the proper hepatic artery and additional angiography was performed. Multiple attempts were made to cannulate the gastroduodenal artery with a wire and catheter. These  attempts at cannulating the gastroduodenal artery were unsuccessful. The micro catheter and 5 French catheter were removed. Final angiogram was performed through the right groin sheath. The groin sheath was removed with an Exoseal closure device. There was groin hemostasis at the end of the procedure.  COMPLICATIONS: None  FINDINGS: Superior mesenteric arteriogram: Patient has a metallic biliary stent and IVC filter. Patient has a large replaced right hepatic artery that originates near the origin of the superior mesenteric artery. There are small pancreatic-duodenal branches but no evidence for a vascular malformation or contrast extravasation. The delayed images demonstrate patency of the superior mesenteric veins but  there is marked attenuation and narrowing of the portal confluence and main portal vein. The intrahepatic portal venous system appears to be patent.  Celiac arteriography: The splenic artery is large and patent. There is a prominent left gastric artery with filling of the right gastric artery. There is mild narrowing and irregularity at the common hepatic artery and proper hepatic artery. There appears to be irregularity of the gastroduodenal artery. There is narrowing involving the origin of the right hepatic artery. There is no evidence for acute extravasation. After a wire was advanced into the common hepatic artery, there was marked narrowing of the common hepatic artery suggestive for vasospasm and minimal flow into the gastroduodenal artery. At one point, there appeared to be retrograde flow into the gastroduodenal artery. After the vasospasm resolved, the flow to the common hepatic artery and gastroduodenal artery returned to baseline.  IMPRESSION: Irregularity of the common hepatic artery, gastroduodenal artery and proper hepatic artery. These findings are related to the surrounding tumor burden. There was extensive vasospasm in the common hepatic artery during this procedure but no evidence for acute bleeding or contrast extravasation. The gastroduodenal artery could never be successfully cannulated and, therefore, an empiric gastroduodenal embolization could not be performed.  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   Electronically Signed   By: Richarda Overlie M.D.   On: 02/18/2013 10:29   Ir Angiogram Visceral Selective  02/18/2013   CLINICAL DATA:  57 year old with metastatic pancreatic cancer. Acute and recurrent upper GI bleeding. The patient has had previous duodenal bleeding based on endoscopy.  EXAM: VISCERAL ARTERIOGRAPHY ; SELECTIVE VISCERAL ARTERIOGRAPHY ; ULTRASOUND GUIDANCE FOR VASCULAR ACCESS  Physician: Rachelle Hora. Henn, MD  MEDICATIONS: Versed 5 mg, fentanyl  100 mcg. A radiology nurse monitored the patient for moderate sedation.  ANESTHESIA/SEDATION: Moderate sedation time: 1 hr and 45 min  CONTRAST:  85 mL Omnipaque 300  FLUOROSCOPY TIME:  29 min and 30 seconds  PROCEDURE: The procedure was explained to the patient. The risks and benefits of the procedure were discussed and the patient's questions were addressed. Informed consent was obtained from the patient. Patient was brought to the interventional suite. The right groin was prepped and draped in sterile fashion. Maximal barrier sterile technique was utilized including caps, mask, sterile gowns, sterile gloves, sterile drape, hand hygiene and skin antiseptic. The skin was anesthetized with 1% lidocaine. Using ultrasound guidance, a 21 gauge needle was directed into the right common femoral artery and a micropuncture dilator set was placed. A 5 French vascular sheath was placed over a Bentson wire. The superior mesenteric artery was cannulated with a C2 catheter. Superior mesenteric arteriogram was performed. The C2 catheter was used to cannulate the celiac trunk. Celiac arteriography was performed. A Glidewire was advanced into the common hepatic artery and the C2 catheter was advanced into the proximal  common hepatic artery. At this point, there appeared to be extensive spasm in the common hepatic artery. C2 catheter was pulled back to the celiac trunk origin and additional angiography was performed. Multiple attempts were made to cannulate the common hepatic artery with a Renegade micro catheter. Cannulating the common hepatic artery was very difficult with the micro catheter. As result, the C2 catheter was exchanged for a Sos catheter. Eventually, a glide micro wire was advanced into the common hepatic artery and proper hepatic artery. The Renegade catheter was advanced to the common hepatic artery and additional angiography was performed. Catheter was also advanced into the proper hepatic artery and additional  angiography was performed. Multiple attempts were made to cannulate the gastroduodenal artery with a wire and catheter. These attempts at cannulating the gastroduodenal artery were unsuccessful. The micro catheter and 5 French catheter were removed. Final angiogram was performed through the right groin sheath. The groin sheath was removed with an Exoseal closure device. There was groin hemostasis at the end of the procedure.  COMPLICATIONS: None  FINDINGS: Superior mesenteric arteriogram: Patient has a metallic biliary stent and IVC filter. Patient has a large replaced right hepatic artery that originates near the origin of the superior mesenteric artery. There are small pancreatic-duodenal branches but no evidence for a vascular malformation or contrast extravasation. The delayed images demonstrate patency of the superior mesenteric veins but there is marked attenuation and narrowing of the portal confluence and main portal vein. The intrahepatic portal venous system appears to be patent.  Celiac arteriography: The splenic artery is large and patent. There is a prominent left gastric artery with filling of the right gastric artery. There is mild narrowing and irregularity at the common hepatic artery and proper hepatic artery. There appears to be irregularity of the gastroduodenal artery. There is narrowing involving the origin of the right hepatic artery. There is no evidence for acute extravasation. After a wire was advanced into the common hepatic artery, there was marked narrowing of the common hepatic artery suggestive for vasospasm and minimal flow into the gastroduodenal artery. At one point, there appeared to be retrograde flow into the gastroduodenal artery. After the vasospasm resolved, the flow to the common hepatic artery and gastroduodenal artery returned to baseline.  IMPRESSION: Irregularity of the common hepatic artery, gastroduodenal artery and proper hepatic artery. These findings are related to  the surrounding tumor burden. There was extensive vasospasm in the common hepatic artery during this procedure but no evidence for acute bleeding or contrast extravasation. The gastroduodenal artery could never be successfully cannulated and, therefore, an empiric gastroduodenal embolization could not be performed.  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   Electronically Signed   By: Richarda Overlie M.D.   On: 02/18/2013 10:29   Ir Angiogram Selective Each Additional Vessel  02/18/2013   INDICATION: History of metastatic pancreatic cancer, now with acute and recurrent upper GI bleeding from the duodenum based on recent endoscopy. Post failed attempted endoscopic duodenal stent placement and mesenteric arteriogram  EXAM: 1.  CELIAC ARTERIOGRAM  2.  COMMON HEPATIC ARTERIOGRAM (3RD ORDER).  3. SUB SELECTIVE GASTRODUODENAL ARTERIOGRAM (3RD ORDER) WITH PARTICLE AND COIL EMBOLIZATION  4.  ULTRASOUND GUIDANCE FOR ARTERIAL ACCESS  MEDICATIONS: Nitroglycerin 100 mcg IA  TECHNIQUE: Informed written consent was obtained from the patient after a discussion of the risks, benefits and alternatives to treatment. Questions regarding the procedure were encouraged and answered. A timeout was performed prior to the initiation of  the procedure.  The right groin was prepped and draped in the usual sterile fashion, and a sterile drape was applied covering the operative field. Maximum barrier sterile technique with sterile gowns and gloves were used for the procedure. A timeout was performed prior to the initiation of the procedure. Local anesthesia was provided with 1% lidocaine.  The right femoral head was marked fluoroscopically. Under ultrasound guidance, the right common femoral artery was accessed with a micropuncture kit after the overlying soft tissues were anesthetized with 1% lidocaine. An ultrasound image was saved for documentation purposes. The micropuncture sheath was exchanged for a 5  [place] vascular sheath over a Bentson wire. A closure arteriogram was performed through the side of the sheath confirming access within the right common femoral artery.  Over a Bentson wire, a Mickelson catheter was advanced to the level of the thoracic aorta where it was back bled and flushed. The catheter was then utilized to select the celiac artery and a celiac arteriogram was performed. 100 mcg of nitroglycerin was not administered intra-arterially through the celiac arterial vascular distribution.  With the use of a Fathom 14 microwire, a regular Renegade microcatheter was advanced into the common hepatic artery and a common hepatic arteriogram was performed. The microcatheter was then advanced into the distal aspect of the gastroduodenal artery and a sub selective gastroduodenal arteriogram was performed.  Initially, the distal tributaries of the GDA were embolized with 700-900 micron Embospheres. Limited post particle embolization arteriogram was performed. Subsequently, the gastroduodenal artery was coil embolized to near its origin with a combination of overlapping 2 and 3 mm diameter interlock coils.  The micro catheter was retracted into the common hepatic artery and a repeat common hepatic arteriogram was performed. The micro catheter was removed and a post embolization celiac arteriogram was performed. All images were reviewed and the procedure was terminated. All wires, catheters and sheaths were removed from the patient. Hemostasis was achieved at the right groin access site with manual compression.  ANESTHESIA/SEDATION: Fentanyl 200 mcg IV; Versed 4 mg IV  60 minutes  CONTRAST:  50 cc Omnipaque 300  COMPARISON:  None.  FLUOROSCOPY TIME:  11 min, 42 seconds  COMPLICATIONS: None immediate  FINDINGS: Celiac arteriogram demonstrates persistent irregular narrowing of both the common and proper hepatic arteries extending preferentially involve the origin of the right hepatic artery.  The gastroduodenal  artery remains patent though irregularly narrowed throughout its course.  The gastroduodenal artery was successfully cannulated and embolized with a combination of 700 to 900 micron Embospheres and overlapping interlock coils to near its origin. 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  IMPRESSION: 1. Successful prophylactic particle and coil embolization of the gastroduodenal artery secondary to recurrent and refractory upper GI (duodenal) bleed due to malignant invasion from patient's known pancreatic head carcinoma. 2. While the common and proper hepatic arteries remain irregularly narrowed throughout their course, I do not feel that these vessels are contributing to the patient's current hemodynamic instability and as such were not embolized.  PLAN: Pending the patient's wishes, would recommend continued aggressive conservative management. 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   Electronically Signed   By: Simonne Come M.D.   On: 02/18/2013 13:58   Ir Angiogram Selective Each Additional Vessel  02/18/2013   CLINICAL DATA:  57 year old with metastatic pancreatic cancer. Acute and recurrent upper GI bleeding. The patient has had previous duodenal bleeding based on endoscopy.  EXAM: VISCERAL ARTERIOGRAPHY ; SELECTIVE VISCERAL ARTERIOGRAPHY ; ULTRASOUND GUIDANCE FOR VASCULAR ACCESS  Physician: Rachelle Hora. Henn, MD  MEDICATIONS: Versed 5 mg, fentanyl 100 mcg. A radiology nurse monitored the patient for moderate sedation.  ANESTHESIA/SEDATION: Moderate  sedation time: 1 hr and 45 min  CONTRAST:  85 mL Omnipaque 300  FLUOROSCOPY TIME:  29 min and 30 seconds  PROCEDURE: The procedure was explained to the patient. The risks and benefits of the procedure were discussed and the patient's questions were addressed. Informed consent was obtained from the patient. Patient was brought to the interventional suite. The right groin was prepped and draped in sterile fashion. Maximal barrier sterile technique was utilized including caps, mask, sterile gowns, sterile gloves, sterile drape, hand hygiene and skin antiseptic. The skin was anesthetized with 1% lidocaine. Using ultrasound guidance, a 21 gauge needle was directed into the right common femoral artery and a micropuncture dilator set was placed. A 5 French vascular sheath was placed over a Bentson wire. The superior mesenteric artery was cannulated with a C2 catheter. Superior mesenteric arteriogram was performed. The C2 catheter was used to cannulate the celiac trunk. Celiac arteriography was performed. A Glidewire was advanced into the common hepatic artery and the C2 catheter was advanced into the proximal common hepatic artery. At this point, there appeared to be extensive spasm in the common hepatic artery. C2 catheter was pulled back to the celiac trunk origin and additional angiography was performed. Multiple attempts were made to cannulate the common hepatic artery with a Renegade micro catheter. Cannulating the common hepatic artery was very difficult with the micro catheter. As result, the C2 catheter was exchanged for a Sos catheter. Eventually, a glide micro wire was advanced into the common hepatic artery and proper hepatic artery. The Renegade catheter was advanced to the common hepatic artery and additional angiography was performed. Catheter was also advanced into the proper hepatic artery and additional angiography was performed. Multiple attempts were made to cannulate the gastroduodenal artery with a  wire and catheter. These attempts at cannulating the gastroduodenal artery were unsuccessful. The micro catheter and 5 French catheter were removed. Final angiogram was performed through the right groin sheath. The groin sheath was removed with an Exoseal closure device. There was groin hemostasis at the end of the procedure.  COMPLICATIONS: None  FINDINGS: Superior mesenteric arteriogram: Patient has a metallic biliary stent and IVC filter. Patient has a large replaced right hepatic artery that originates near the origin of the superior mesenteric artery. There are small pancreatic-duodenal branches but no evidence for a vascular malformation or contrast extravasation. The delayed images demonstrate patency of the superior mesenteric veins but there is marked attenuation and narrowing of the portal confluence and main portal vein. The intrahepatic portal venous system appears to be patent.  Celiac arteriography: The splenic artery is large and patent. There is a prominent left gastric artery with filling of the right gastric artery. There is mild narrowing and irregularity at the common hepatic artery and proper hepatic artery. There appears to be irregularity of the gastroduodenal artery. There is narrowing involving the origin of the right hepatic artery. There is no evidence for acute extravasation. After  a wire was advanced into the common hepatic artery, there was marked narrowing of the common hepatic artery suggestive for vasospasm and minimal flow into the gastroduodenal artery. At one point, there appeared to be retrograde flow into the gastroduodenal artery. After the vasospasm resolved, the flow to the common hepatic artery and gastroduodenal artery returned to baseline.  IMPRESSION: Irregularity of the common hepatic artery, gastroduodenal artery and proper hepatic artery. These findings are related to the surrounding tumor burden. There was extensive vasospasm in the common hepatic artery during this  procedure but no evidence for acute bleeding or contrast extravasation. The gastroduodenal artery could never be successfully cannulated and, therefore, an empiric gastroduodenal embolization could not be performed.  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   Electronically Signed   By: Richarda Overlie M.D.   On: 02/18/2013 10:29   Ir Angiogram Selective Each Additional Vessel  02/18/2013   CLINICAL DATA:  57 year old with metastatic pancreatic cancer. Acute and recurrent upper GI bleeding. The patient has had previous duodenal bleeding based on endoscopy.  EXAM: VISCERAL ARTERIOGRAPHY ; SELECTIVE VISCERAL ARTERIOGRAPHY ; ULTRASOUND GUIDANCE FOR VASCULAR ACCESS  Physician: Rachelle Hora. Henn, MD  MEDICATIONS: Versed 5 mg, fentanyl 100 mcg. A radiology nurse monitored the patient for moderate sedation.  ANESTHESIA/SEDATION: Moderate sedation time: 1 hr and 45 min  CONTRAST:  85 mL Omnipaque 300  FLUOROSCOPY TIME:  29 min and 30 seconds  PROCEDURE: The procedure was explained to the patient. The risks and benefits of the procedure were discussed and the patient's questions were addressed. Informed consent was obtained from the patient. Patient was brought to the interventional suite. The right groin was prepped and draped in sterile fashion. Maximal barrier sterile technique was utilized including caps, mask, sterile gowns, sterile gloves, sterile drape, hand hygiene and skin antiseptic. The skin was anesthetized with 1% lidocaine. Using ultrasound guidance, a 21 gauge needle was directed into the right common femoral artery and a micropuncture dilator set was placed. A 5 French vascular sheath was placed over a Bentson wire. The superior mesenteric artery was cannulated with a C2 catheter. Superior mesenteric arteriogram was performed. The C2 catheter was used to cannulate the celiac trunk. Celiac arteriography was performed. A Glidewire was advanced into the common hepatic artery and  the C2 catheter was advanced into the proximal common hepatic artery. At this point, there appeared to be extensive spasm in the common hepatic artery. C2 catheter was pulled back to the celiac trunk origin and additional angiography was performed. Multiple attempts were made to cannulate the common hepatic artery with a Renegade micro catheter. Cannulating the common hepatic artery was very difficult with the micro catheter. As result, the C2 catheter was exchanged for a Sos catheter. Eventually, a glide micro wire was advanced into the common hepatic artery and proper hepatic artery. The Renegade catheter was advanced to the common hepatic artery and additional angiography was performed. Catheter was also advanced into the proper hepatic artery and additional angiography was performed. Multiple attempts were made to cannulate the gastroduodenal artery with a wire and catheter. These attempts at cannulating the gastroduodenal artery were unsuccessful. The micro catheter and 5 French catheter were removed. Final angiogram was performed through the right groin sheath. The groin sheath was removed with an Exoseal closure device. There was groin hemostasis at the end of the procedure.  COMPLICATIONS: None  FINDINGS: Superior mesenteric arteriogram: Patient has a metallic biliary stent and IVC filter. Patient has  a large replaced right hepatic artery that originates near the origin of the superior mesenteric artery. There are small pancreatic-duodenal branches but no evidence for a vascular malformation or contrast extravasation. The delayed images demonstrate patency of the superior mesenteric veins but there is marked attenuation and narrowing of the portal confluence and main portal vein. The intrahepatic portal venous system appears to be patent.  Celiac arteriography: The splenic artery is large and patent. There is a prominent left gastric artery with filling of the right gastric artery. There is mild narrowing  and irregularity at the common hepatic artery and proper hepatic artery. There appears to be irregularity of the gastroduodenal artery. There is narrowing involving the origin of the right hepatic artery. There is no evidence for acute extravasation. After a wire was advanced into the common hepatic artery, there was marked narrowing of the common hepatic artery suggestive for vasospasm and minimal flow into the gastroduodenal artery. At one point, there appeared to be retrograde flow into the gastroduodenal artery. After the vasospasm resolved, the flow to the common hepatic artery and gastroduodenal artery returned to baseline.  IMPRESSION: Irregularity of the common hepatic artery, gastroduodenal artery and proper hepatic artery. These findings are related to the surrounding tumor burden. There was extensive vasospasm in the common hepatic artery during this procedure but no evidence for acute bleeding or contrast extravasation. The gastroduodenal artery could never be successfully cannulated and, therefore, an empiric gastroduodenal embolization could not be performed.  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   Electronically Signed   By: Richarda Overlie M.D.   On: 02/18/2013 10:29   Ir Ivc Filter Plmt / S&i /img Guid/mod Sed  02/11/2013   CLINICAL DATA:  Metastatic pancreatic carcinoma. History of pulmonary embolism. GI bleeding, a relative contraindication to anticoagulation.  EXAM: INFERIOR VENACAVOGRAM  IVC FILTER PLACEMENT UNDER FLUOROSCOPY  TECHNIQUE: The procedure, risks (including but not limited to bleeding, infection, organ damage ), benefits, and alternatives were explained to the patient. Questions regarding the procedure were encouraged and answered. The patient understands and consents to the procedure. Caval anatomy reviewed on previous CT abdomen. Patency of the right IJ vein was confirmed with ultrasound with image documentation. An appropriate skin site was  determined. Skin site was marked, prepped with chlorhexidine, and draped using maximum barrier technique. The region was infiltrated locally with 1% lidocaine.  Intravenous Fentanyl and Versed were administered as conscious sedation during continuous cardiorespiratory monitoring by the radiology RN, with a total moderate sedation time of less than 30 minutes.  Under real-time ultrasound guidance, the right IJ vein was accessed with a 21 gauge micropuncture needle; the needle tip within the vein was confirmed with ultrasound image documentation. The needle was exchanged over a 018 guidewire for a transitional dilator, which allow advancement of the [facility] wire into the IVC. A long 6 French vascular sheath was placed for inferior venacavography. This demonstrated no caval thrombus. Renal vein inflows were evident.  The [facility] IVC filter was advanced through the sheath and successfully deployed under fluoroscopy at the L2-3 disc level. Followup cavagram demonstrates stable filter position and no evident complication. The sheath was removed and hemostasis achieved at the site. No immediate complication.  FLUOROSCOPY TIME:  36 seconds  IMPRESSION: 1. Normal IVC. No thrombus or significant anatomic variation. 2. Technically successful infrarenal IVC filter placement. This is a retrievable model.   Electronically Signed   By: Oley Balm M.D.   On: 02/11/2013 13:43  US Paracentesis  02/26/2013   CLINICAL DATA:  Metastatic pancreatic carcinoma, recurrent ascites. Request is made for therapeutic paracentesis.  EXAM: ULTRASOUND GUIDED THERAPEUTIC PARACENTESIS  COMPARISON:  PREVIOUS PARACENTESIS ON 02/24/2013.  PROCEDURE: An ultrasound guided paracentesis was thoroughly discussed with the patient and questions answered. The benefits, risks, alternatives and complications were also discussed. The patient understands and wishes to proceed with the procedure. Written consent was obtained.  Ultrasound was performed to  localize and mark an adequate pocket of fluid in the right lower. quadrant of the abdomen. The area was then prepped and draped in the normal sterile fashion. 1% Lidocaine was used for local anesthesia. Under ultrasound guidance a 19 gauge Yueh catheter was introduced. Paracentesis was performed. The catheter was removed and a dressing applied.  COMPLICATIONS: None immediate  FINDINGS: A total of approximately 3.4 liters. Of light yellow. fluid was removed.  IMPRESSION: Successful ultrasound guided therapeutic paracentesis yielding 3.4 liters. of ascites.  Read by: Jeananne Rama ,P.A.-C.   Electronically Signed   By: Irish Lack M.D.   On: 02/26/2013 09:43   US Paracentesis  02/24/2013   CLINICAL DATA:  Abdominal distention, ascites. Request diagnostic and therapeutic paracentesis.  EXAM: ULTRASOUND GUIDED PARACENTESIS  COMPARISON:  None  PROCEDURE: An ultrasound guided paracentesis was thoroughly discussed with the patient and questions answered. The benefits, risks, alternatives and complications were also discussed. The patient understands and wishes to proceed with the procedure. Written consent was obtained.  Ultrasound was performed to localize and mark an adequate pocket of fluid in the right lower quadrant of the abdomen. The area was then prepped and draped in the normal sterile fashion. 1% Lidocaine was used for local anesthesia. Under ultrasound guidance a 19 gauge Yueh catheter was introduced. Paracentesis was performed. The catheter was removed and a dressing applied.  COMPLICATIONS: None immediate  FINDINGS: A total of approximately 5.6 L of clear yellow fluid was removed. A fluid sample was sent for laboratory analysis.  IMPRESSION: Successful ultrasound guided paracentesis yielding 5.6 L of ascites.  Read by: Brayton El PA-C   Electronically Signed   By: Maryclare Bean M.D.   On: 02/24/2013 09:29   Ir US Guide Vasc Access Right  02/18/2013   INDICATION: History of metastatic pancreatic  cancer, now with acute and recurrent upper GI bleeding from the duodenum based on recent endoscopy. Post failed attempted endoscopic duodenal stent placement and mesenteric arteriogram  EXAM: 1.  CELIAC ARTERIOGRAM  2.  COMMON HEPATIC ARTERIOGRAM (3RD ORDER).  3. SUB SELECTIVE GASTRODUODENAL ARTERIOGRAM (3RD ORDER) WITH PARTICLE AND COIL EMBOLIZATION  4.  ULTRASOUND GUIDANCE FOR ARTERIAL ACCESS  MEDICATIONS: Nitroglycerin 100 mcg IA  TECHNIQUE: Informed written consent was obtained from the patient after a discussion of the risks, benefits and alternatives to treatment. Questions regarding the procedure were encouraged and answered. A timeout was performed prior to the initiation of the procedure.  The right groin was prepped and draped in the usual sterile fashion, and a sterile drape was applied covering the operative field. Maximum barrier sterile technique with sterile gowns and gloves were used for the procedure. A timeout was performed prior to the initiation of the procedure. Local anesthesia was provided with 1% lidocaine.  The right femoral head was marked fluoroscopically. Under ultrasound guidance, the right common femoral artery was accessed with a micropuncture kit after the overlying soft tissues were anesthetized with 1% lidocaine. An ultrasound image was saved for documentation purposes. The micropuncture sheath was exchanged for a 5 [place]  vascular sheath over a Bentson wire. A closure arteriogram was performed through the side of the sheath confirming access within the right common femoral artery.  Over a Bentson wire, a Mickelson catheter was advanced to the level of the thoracic aorta where it was back bled and flushed. The catheter was then utilized to select the celiac artery and a celiac arteriogram was performed. 100 mcg of nitroglycerin was not administered intra-arterially through the celiac arterial vascular distribution.  With the use of a Fathom 14 microwire, a regular Renegade  microcatheter was advanced into the common hepatic artery and a common hepatic arteriogram was performed. The microcatheter was then advanced into the distal aspect of the gastroduodenal artery and a sub selective gastroduodenal arteriogram was performed.  Initially, the distal tributaries of the GDA were embolized with 700-900 micron Embospheres. Limited post particle embolization arteriogram was performed. Subsequently, the gastroduodenal artery was coil embolized to near its origin with a combination of overlapping 2 and 3 mm diameter interlock coils.  The micro catheter was retracted into the common hepatic artery and a repeat common hepatic arteriogram was performed. The micro catheter was removed and a post embolization celiac arteriogram was performed. All images were reviewed and the procedure was terminated. All wires, catheters and sheaths were removed from the patient. Hemostasis was achieved at the right groin access site with manual compression.  ANESTHESIA/SEDATION: Fentanyl 200 mcg IV; Versed 4 mg IV  60 minutes  CONTRAST:  50 cc Omnipaque 300  COMPARISON:  None.  FLUOROSCOPY TIME:  11 min, 42 seconds  COMPLICATIONS: None immediate  FINDINGS: Celiac arteriogram demonstrates persistent irregular narrowing of both the common and proper hepatic arteries extending preferentially involve the origin of the right hepatic artery.  The gastroduodenal artery remains patent though irregularly narrowed throughout its course.  The gastroduodenal artery was successfully cannulated and embolized with a combination of 700 to 900 micron Embospheres and overlapping interlock coils to near its origin. 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  IMPRESSION: 1. Successful prophylactic particle and coil embolization of the gastroduodenal artery secondary to recurrent and refractory upper GI (duodenal) bleed due to malignant invasion from patient's known pancreatic head  carcinoma. 2. While the common and proper hepatic arteries remain irregularly narrowed throughout their course, I do not feel that these vessels are contributing to the patient's current hemodynamic instability and as such were not embolized.  PLAN: Pending the patient's wishes, would recommend continued aggressive conservative management. 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   Electronically Signed   By: Simonne Come M.D.   On: 02/18/2013 13:58   Ir US Guide Vasc Access Right  02/18/2013   CLINICAL DATA:  57 year old with metastatic pancreatic cancer. Acute and recurrent upper GI bleeding. The patient has had previous duodenal bleeding based on endoscopy.  EXAM: VISCERAL ARTERIOGRAPHY ; SELECTIVE VISCERAL ARTERIOGRAPHY ; ULTRASOUND GUIDANCE FOR VASCULAR ACCESS  Physician: Rachelle Hora. Henn, MD  MEDICATIONS: Versed 5 mg, fentanyl 100 mcg. A radiology nurse monitored the patient for moderate sedation.  ANESTHESIA/SEDATION: Moderate sedation time: 1 hr and 45 min  CONTRAST:  85 mL Omnipaque  300  FLUOROSCOPY TIME:  29 min and 30 seconds  PROCEDURE: The procedure was explained to the patient. The risks and benefits of the procedure were discussed and the patient's questions were addressed. Informed consent was obtained from the patient. Patient was brought to the interventional suite. The right groin was prepped and draped in sterile fashion. Maximal barrier sterile technique was utilized including caps, mask, sterile gowns, sterile gloves, sterile drape, hand hygiene and skin antiseptic. The skin was  anesthetized with 1% lidocaine. Using ultrasound guidance, a 21 gauge needle was directed into the right common femoral artery and a micropuncture dilator set was placed. A 5 French vascular sheath was placed over a Bentson wire. The superior mesenteric artery was cannulated with a C2 catheter. Superior mesenteric arteriogram was performed. The C2 catheter was used to cannulate the celiac trunk. Celiac arteriography was performed. A Glidewire was advanced into the common hepatic artery and the C2 catheter was advanced into the proximal common hepatic artery. At this point, there appeared to be extensive spasm in the common hepatic artery. C2 catheter was pulled back to the celiac trunk origin and additional angiography was performed. Multiple attempts were made to cannulate the common hepatic artery with a Renegade micro catheter. Cannulating the common hepatic artery was very difficult with the micro catheter. As result, the C2 catheter was exchanged for a Sos catheter. Eventually, a glide micro wire was advanced into the common hepatic artery and proper hepatic artery. The Renegade catheter was advanced to the common hepatic artery and additional angiography was performed. Catheter was also advanced into the proper hepatic artery and additional angiography was performed. Multiple attempts were made to cannulate the gastroduodenal artery with a wire and catheter. These attempts at cannulating the gastroduodenal artery were unsuccessful. The micro catheter and 5 French catheter were removed. Final angiogram was performed through the right groin sheath. The groin sheath was removed with an Exoseal closure device. There was groin hemostasis at the end of the procedure.  COMPLICATIONS: None  FINDINGS: Superior mesenteric arteriogram: Patient has a metallic biliary stent and IVC filter. Patient has a large replaced right hepatic artery that originates near the origin of the superior mesenteric artery. There are small  pancreatic-duodenal branches but no evidence for a vascular malformation or contrast extravasation. The delayed images demonstrate patency of the superior mesenteric veins but there is marked attenuation and narrowing of the portal confluence and main portal vein. The intrahepatic portal venous system appears to be patent.  Celiac arteriography: The splenic artery is large and patent. There is a prominent left gastric artery with filling of the right gastric artery. There is mild narrowing and irregularity at the common hepatic artery and proper hepatic artery. There appears to be irregularity of the gastroduodenal artery. There is narrowing involving the origin of the right hepatic artery. There is no evidence for acute extravasation. After a wire was advanced into the common hepatic artery, there was marked narrowing of the common hepatic artery suggestive for vasospasm and minimal flow into the gastroduodenal artery. At one point, there appeared to be retrograde flow into the gastroduodenal artery. After the vasospasm resolved, the flow to the common hepatic artery and gastroduodenal artery returned to baseline.  IMPRESSION: Irregularity of the common hepatic artery, gastroduodenal artery and proper hepatic artery. These findings are related to the surrounding tumor burden. There was extensive vasospasm in the common hepatic artery during this procedure but no evidence for acute bleeding or contrast extravasation. The gastroduodenal artery could  never be successfully cannulated and, therefore, an empiric gastroduodenal embolization could not be performed.  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   Electronically Signed   By: Richarda Overlie M.D.   On: 02/18/2013 10:29   Dg C-arm 1-60 Min-no Report  02/16/2013   CLINICAL DATA: stent placement   C-ARM 1-60 MINUTES  Fluoroscopy was utilized by the requesting physician.  No radiographic  interpretation.    Ir Rebekah Chesterfield  [facility] [employer] Guide Road[employer]  02/18/2013   INDICATION: History of metastatic pancreatic cancer, now with acute and recurrent upper GI bleeding from the duodenum based on recent endoscopy. Post failed attempted endoscopic duodenal stent placement and mesenteric arteriogram  EXAM: 1.  CELIAC ARTERIOGRAM  2.  COMMON HEPATIC ARTERIOGRAM (3RD ORDER).  3. SUB SELECTIVE GASTRODUODENAL ARTERIOGRAM (3RD ORDER) WITH PARTICLE AND COIL EMBOLIZATION  4.  ULTRASOUND GUIDANCE FOR ARTERIAL ACCESS  MEDICATIONS: Nitroglycerin 100 mcg IA  TECHNIQUE: Informed written consent was obtained from the patient after a discussion of the risks, benefits and alternatives to treatment. Questions regarding the procedure were encouraged and answered. A timeout was performed prior to the initiation of the procedure.  The right groin was prepped and draped in the usual sterile fashion, and a sterile drape was applied covering the operative field. Maximum barrier sterile technique with sterile gowns and gloves were used for the procedure. A timeout was performed prior to the initiation of the procedure. Local anesthesia was provided with 1% lidocaine.  The right femoral head was marked fluoroscopically. Under ultrasound guidance, the right common femoral artery was accessed with a micropuncture kit after the overlying soft tissues were anesthetized with 1% lidocaine. An ultrasound image was saved for documentation purposes. The micropuncture sheath was exchanged for a 5 [place] vascular sheath over a Bentson wire. A closure arteriogram was performed through the side of the sheath confirming access within the right common femoral artery.  Over a Bentson wire, a Mickelson catheter was advanced to the level of the thoracic aorta where it was back bled and flushed. The catheter was then utilized to select the celiac artery and a celiac arteriogram was performed. 100 mcg of nitroglycerin was not administered intra-arterially through the  celiac arterial vascular distribution.  With the use of a Fathom 14 microwire, a regular Renegade microcatheter was advanced into the common hepatic artery and a common hepatic arteriogram was performed. The microcatheter was then advanced into the distal aspect of the gastroduodenal artery and a sub selective gastroduodenal arteriogram was performed.  Initially, the distal tributaries of the GDA were embolized with 700-900 micron Embospheres. Limited post particle embolization arteriogram was performed. Subsequently, the gastroduodenal artery was coil embolized to near its origin with a combination of overlapping 2 and 3 mm diameter interlock coils.  The micro catheter was retracted into the common hepatic artery and a repeat common hepatic arteriogram was performed. The micro catheter was removed and a post embolization celiac arteriogram was performed. All images were reviewed and the procedure was terminated. All wires, catheters and sheaths were removed from the patient. Hemostasis was achieved at the right groin access site with manual compression.  ANESTHESIA/SEDATION: Fentanyl 200 mcg IV; Versed 4 mg IV  60 minutes  CONTRAST:  50 cc Omnipaque 300  COMPARISON:  None.  FLUOROSCOPY TIME:  11 min, 42 seconds  COMPLICATIONS: None immediate  FINDINGS: Celiac arteriogram demonstrates persistent irregular narrowing of both the common and proper hepatic arteries extending  preferentially involve the origin of the right hepatic artery.  The gastroduodenal artery remains patent though irregularly narrowed throughout its course.  The gastroduodenal artery was successfully cannulated and embolized with a combination of 700 to 900 micron Embospheres and overlapping interlock coils to near its origin. 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  IMPRESSION: 1. Successful prophylactic particle and coil embolization of the gastroduodenal artery secondary to recurrent and  refractory upper GI (duodenal) bleed due to malignant invasion from patient's known pancreatic head carcinoma. 2. While the common and proper hepatic arteries remain irregularly narrowed throughout their course, I do not feel that these vessels are contributing to the patient's current hemodynamic instability and as such were not embolized.  PLAN: Pending the patient's wishes, would recommend continued aggressive conservative management. 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   Electronically Signed   By: Simonne Come M.D.   On: 02/18/2013 13:58   Ct Angio Abd/pel W/ And/or W/o  02/23/2013   CLINICAL DATA:  GI bleed post embolization. Abdominal pain and distension.  EXAM: CT ANGIOGRAPHY ABDOMEN AND PELVIS  TECHNIQUE: Multidetector CT imaging of the abdomen and pelvis was performed using the standard protocol during bolus administration of intravenous contrast. Multiplanar reconstructed images including MIPs were obtained and reviewed to evaluate the vascular anatomy.  CONTRAST:  OMNIPAQUE IOHEXOL 350 MG/ML SOLN  COMPARISON:  02/10/2013  FINDINGS: ARTERIAL FINDINGS:  Aorta: Minimal atheromatous plaque in its infrarenal segment. No aneurysm, dissection, or stenosis.  Celiac axis: Widely patent. Tortuous splenic artery. Attenuated left hepatic artery secondary to tumor encasement. Interval coil embolization of gastroduodenal artery.  Superior mesenteric: Patent. Replaced right hepatic arterial supply, an anatomic variant, with irregular narrowing of the proximal right hepatic artery secondary to  tumor encasement.  Left renal:           Single, patent.  Right renal:          Single, patent.  Inferior mesenteric:  Patent  Left iliac:           Unremarkable  Right iliac:          Unremarkable  Venous findings: Infrarenal IVC filter with some trapped thrombus, without evidence of caval occlusion. Patent bilateral renal veins. There is tumor encasement of the portosplenic confluence with obstruction of the central aspect of the splenic vein, central superior mesenteric vein, and proximal portal vein. There has been some cavernous transformation of the portal vein with collateral flow maintaining patency of the distal main and right portal vein. Left portal vein is thrombosed. DVT noted in the right femoral vein, incompletely visualized through its distal aspect.  Review of the MIP images confirms the above findings.  Nonvascular findings: Platelike atelectasis posteriorly in the visualized lung bases. Small pleural effusions have developed. Progression of abdominal and pelvic ascites. There is no layering high attenuation component to suggest hemoperitoneum. Multiple low-attenuation hepatic lesions are again evident. There is a metallic stent through the common bile duct with left pneumobilia implying stent patency. Pancreatic head mass encases the common duct and vasculature as detailed above. Wedge-shaped enhancement defects in the spleen as before.  Stable 13 mm low-attenuation left adrenal nodule. Unremarkable right adrenal gland.  New small perfusion defects in both kidneys involving the upper pole on the left, lower pole on the right. These persist on delayed scans. There is however normal excretion of contrast into decompressed renal collecting systems and proximal ureters. The stomach, small bowel, and colon are nondilated. There is wall thickening suspected in multiple loops of nondistended mid and distal ileum, cecum and proximal ascending colon. Urinary bladder incompletely distended. Stable left  para-aortic adenopathy. Stable mild superior endplate compression deformity of L3. Partially calcified disc protrusions L3-4, L4-5, L5-S1. Facet degenerative hypertrophy L3-S1, right worse than left.  IMPRESSION: 1. No evidence of retroperitoneal or intra-abdominal acute hemorrhage post embolization of the gastroduodenal artery. 2. Interval increase in abdominal ascites. 3. New small bilateral pleural effusions. 4. Wedge-shaped peripheral enhancement abnormalities in bilateral kidneys, new since previous study, may represent arterial emboli less likely multifocal pyelonephritis. 5. Progressive wall thickening in the distal jejunum and proximal ascending colon. Considerations include vascular insufficiency/ischemia versus colitis/enteritis. 6. Pancreatic mass with occlusion of the porto-splenic confluence and proximal main portal vein, encasement and narrowing of right and left hepatic arteries as before. 7. Right lower extremity DVT with nonocclusive thrombus noted in the infrarenal IVC filter. 8. 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   Electronically Signed   By: Oley Balm M.D.   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 CT angiography is performed to assess vasculature prior to potential arteriography and transcatheter embolization to treat persistent bleeding.  EXAM: CT ANGIOGRAPHY ABDOMEN AND PELVIS WITH CONTRAST AND WITHOUT CONTRAST  TECHNIQUE: Multidetector CT imaging of the abdomen and pelvis was performed using the standard protocol during bolus administration of intravenous contrast. Multiplanar reconstructed images including MIPs were obtained and reviewed to evaluate the vascular anatomy.  CONTRAST:  OMNIPAQUE IOHEXOL 350 MG/ML SOLN  COMPARISON:  12/23/2012  FINDINGS: There is significant enlargement of the mass  involving the head of the pancreas since the prior CT in October. Dimensions of the mass are now approximately 3.7 x 5.6 x 4.5 cm. The mass visibly encases the pre-existing biliary stent and also extends further to encase the gastroduodenal artery, portal venous confluence, hepatic artery and a segment of the superior mesenteric vein. 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 Portal flow is present in the right lobe.  Significant progression of metastatic disease in the liver is also identified in both left and right lobes. Index lesion in the inferior right lobe shows enlargement with dimensions of 2.5 x 3.5 cm (1.6 x 2.1 cm previously). The largest left lobe lesion has enlarged from a maximum diameter of 2.4 cm previously to estimated current maximum diameter of 4.0 cm. All previously identified metastatic lesions have increased significantly in size. There are at least 4 new metastatic lesions identified in the liver. There is no evidence of associated biliary obstruction with air remaining a biliary tree. The common bile duct stent lumen does not appear to be compromised.  There is new ascites with mild to moderate overall volume of ascites present as well as nodularity of the mesenteric and omentum suspicious for carcinomatosis. No bowel obstruction or perforation is identified.  Arterial vasculature  evaluated by CTA shows no evidence of significant occlusive disease. The aorta and major visceral branches are normally patent. At the level of the celiac trunk, the common hepatic artery is narrowed and encased by tumor but remains open with flow noted in both right and left hepatic arteries. The gastroduodenal artery is open but encased by tumor. Small distal GDA branches are present supplying the pancreatic mass. There also appears to be  pancreaticoduodenal supply from the SMA at the level of the pancreatic mass.  The superior mesenteric artery abuts tumor but is not visibly narrowed. Focal nonocclusive thrombus or direct tumor invasion is identified in the superior aspect of the superior mesenteric vein causing luminal stenosis of approximately 60%. The splenic vein remains open.  Bilateral renal artery show normal patency. The inferior mesenteric vein is open. Bilateral iliac and common femoral arteries show normal patency.  Appearance of the gallbladder and kidneys are unremarkable. The spleen shows a new focal wedge-shaped infarct in its posterior and inferior aspect. The rest of the spleen shows normal perfusion. There is a stable left adrenal nodule. Bony structures show degenerative changes of the spine without evidence of focal lesions.  Review of the MIP images confirms the above findings.  IMPRESSION: 1. Significant progression of metastatic pancreatic carcinoma with enlargement of the mass at the level of the head of the pancreas and progression of hepatic metastatic disease. 2. 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 Tumor also encases and narrows the common hepatic artery, right and left hepatic arteries and gastroduodenal artery. There also is tumor involvement at the level of the superior mesenteric vein. 3. 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   Electronically Signed   By: Irish Lack M.D.   On: 02/11/2013 10:34    Microbiology: No results found for this or any previous visit (from the past 240 hour(s)).   Labs: Basic Metabolic Panel:  Recent Labs Lab 02/22/13 0925 02/26/13 0130  NA 137 135  K 3.2* 3.8  CL 108 104  CO2 20 21  GLUCOSE 175* 125*  BUN 9 12  CREATININE 0.83 0.86  CALCIUM 8.4 8.5   Liver Function Tests:  Recent Labs Lab 02/22/13 0925 02/26/13 0130  AST 28  23  ALT 24 17  ALKPHOS 88 94  BILITOT 0.5 0.4  PROT 5.4* 5.3*  ALBUMIN 2.2* 2.2*   No results found for this basename: LIPASE, AMYLASE,  in the last 168 hours No results found for this basename: AMMONIA,  in the last 168 hours CBC:  Recent Labs Lab 02/23/13 0435 02/24/13 0520 02/25/13 0540 02/26/13 0130 02/27/13 0730  WBC 9.3 8.2 6.9 9.7 7.1  HGB 9.5* 9.2* 8.9* 9.2* 8.4*  HCT 27.8* 27.1* 27.2* 27.4* 25.2*  MCV 88.3 90.0 90.1 90.1 90.0  PLT 73* 66* 66* 87* 70*   Cardiac Enzymes: No results found for this basename: CKTOTAL, CKMB, CKMBINDEX, TROPONINI,  in the last 168 hours BNP: BNP (last 3 results)  Recent Labs  02/08/13 1230  PROBNP 54.4   CBG: No results found for this basename: GLUCAP,  in the last 168 hours     Signed:  Chaya Jan  [facility] Pager: [phone] 02/27/2013, 10:04 AM

## 2013-02-27 NOTE — Progress Notes (Signed)
Patient Austin Escobar      DOB: November 18, 1955      AVW:[id number]  Attempted to see patient last evening but he was at radiation.  Noted discharge approved and currently in process.  Unfortunately, the Palliative Medicine team will need to defer to the patient's hospice to team for further goals of care.  He is in good hand between their services and Dr. Truett Perna.     Rashae Rother L. Ladona Ridgel, MD MBA The Palliative Medicine Team at [facility] Phone: [phone] Pager: [phone]

## 2013-02-27 NOTE — Progress Notes (Signed)
IP PROGRESS NOTE  Subjective:   No specific complaint. Flat affect.  Objective: Vital signs in last 24 hours: Blood pressure 117/68, pulse 89, temperature 98.7 F (37.1 C), temperature source Oral, resp. rate 20, height 6\' 1"  (1.854 m), weight 218 lb 6.4 oz (99.066 kg), SpO2 97.00%.  Intake/Output from previous day: 12/26 0701 - 12/27 0700 In: 480 [P.O.:480] Out: -   Physical Exam:  HEENT: Mild whitecoat over the tongue Lungs: Clear bilaterally Cardiac: Regular rate and rhythm Abdomen: Mildly distended, nontender Extremities: Pitting edema at the right greater than left lower leg Skin: Confluence faint erythematous rash over the trunk  Portacath/PICC- with 5 blood surrounding the access needle Lab Results:  Recent Labs  02/26/13 0130 02/27/13 0730  WBC 9.7 7.1  HGB 9.2* 8.4*  HCT 27.4* 25.2*  PLT 87* 70*    BMET  Recent Labs  02/26/13 0130  NA 135  K 3.8  CL 104  CO2 21  GLUCOSE 125*  BUN 12  CREATININE 0.86  CALCIUM 8.5    Studies/Results: Ct Head Wo Contrast  02/26/2013   CLINICAL DATA:  Dysarthria.  Assess for CVA.  EXAM: CT HEAD WITHOUT CONTRAST  TECHNIQUE: Contiguous axial images were obtained from the base of the skull through the vertex without intravenous contrast.  COMPARISON:  CT of the cervical spine performed 10/25/2005  FINDINGS: There is a small evolving relatively acute infarct within the left cerebellar hemisphere, without evidence of midline shift. There is no evidence of hemorrhagic transformation at this time. An underlying mass is considered less likely, given the appearance.  The posterior fossa, including the cerebellum, brainstem and fourth ventricle, is within normal limits. The third and lateral ventricles, and basal ganglia are unremarkable in appearance. The cerebral hemispheres are symmetric in appearance, with normal gray-white differentiation. No mass effect or midline shift is seen.  There is no evidence of fracture; visualized  osseous structures are unremarkable in appearance. The orbits are within normal limits. The paranasal sinuses and mastoid air cells are well-aerated. No significant soft tissue abnormalities are seen.  IMPRESSION: Small relatively acute evolving infarct within the left cerebellar hemisphere, without evidence of hemorrhagic transformation at this time. No evidence of midline shift. An underlying mass is considered less likely, given the appearance, but cannot be excluded.  These results were called by telephone at the time of interpretation on 02/26/2013 at 1:37 AM to [facility] RN on [id number], who verbally acknowledged these results.   Electronically Signed   By: Roanna Raider M.D.   On: 02/26/2013 01:41   US Paracentesis  02/26/2013   CLINICAL DATA:  Metastatic pancreatic carcinoma, recurrent ascites. Request is made for therapeutic paracentesis.  EXAM: ULTRASOUND GUIDED THERAPEUTIC PARACENTESIS  COMPARISON:  PREVIOUS PARACENTESIS ON 02/24/2013.  PROCEDURE: An ultrasound guided paracentesis was thoroughly discussed with the patient and questions answered. The benefits, risks, alternatives and complications were also discussed. The patient understands and wishes to proceed with the procedure. Written consent was obtained.  Ultrasound was performed to localize and mark an adequate pocket of fluid in the right lower. quadrant of the abdomen. The area was then prepped and draped in the normal sterile fashion. 1% Lidocaine was used for local anesthesia. Under ultrasound guidance a 19 gauge Yueh catheter was introduced. Paracentesis was performed. The catheter was removed and a dressing applied.  COMPLICATIONS: None immediate  FINDINGS: A total of approximately 3.4 liters. Of light yellow. fluid was removed.  IMPRESSION: Successful ultrasound guided therapeutic paracentesis yielding 3.4 liters. of ascites.  Read by: Jeananne Rama ,P.A.-C.   Electronically Signed   By: Irish Lack M.D.   On: 02/26/2013 09:43     Medications: I have reviewed the patient's current medications.  Assessment/Plan:  1. Metastatic pancreas cancer-plan for comfort care noted 2. GI bleeding secondary to duodenal invasion by tumor, status post embolization of the gastroduodenal artery 02/18/2013 3. History of a pulmonary embolus 4. 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 He appears comfortable today. Please call oncology as needed, Dr. [name] will return 03/01/2013   LOS: 12 days   Austin Escobar  02/27/2013, 8:19 AM

## 2013-03-01 ENCOUNTER — Ambulatory Visit: Payer: BC Managed Care – PPO

## 2013-03-01 ENCOUNTER — Encounter: Payer: Self-pay | Admitting: Nurse Practitioner

## 2013-03-01 NOTE — Progress Notes (Signed)
Received a call from Hospice, RN informing us that pt is declining. He is have moderate amt of frank red blood during elimination, skin color is ash gray, has become more weaker and lethargic. Unable to stand or get out of bed. Dr. [name] aware. Also brother Romeo Apple is HCPOA and request no information be given out to anyone other than himself or his wife Cordelia Pen.

## 2013-03-02 ENCOUNTER — Telehealth: Payer: Self-pay | Admitting: Hematology & Oncology

## 2013-03-02 NOTE — Telephone Encounter (Signed)
I have spoken w 15 different people on this case from [employer], the members [employer] plan, to a supervisor at [facility] and they all said nothing can be done to change this test. The test was changed without my knowledge and I guess its a lost at this point.  Test Approved  [id number] - Auth # [id number] Test Changed/Done [id number] - by Festus Barren - [phone] - I spoke w Antonieta Iba - [phone] - I spoke w Marcell Barlow

## 2013-03-07 NOTE — Progress Notes (Signed)
  Radiation Oncology         ([phone]) [phone] ________________________________  Name: Austin Escobar MRN: [id number]  Date: 02/26/2013  DOB: 06-Mar-1955  End of Treatment Note  Diagnosis:   Metastatic pancreatic cancer with GI bleed     Indication for treatment:  Palliative       Radiation treatment dates:   02/11/2013 through 02/26/2013  Site/dose:   The patient was treated to the abdomen including the stomach/duodenum. He was treated to a dose of 30 gray in 10 fractions using a 3-D conformal technique. Daily image guidance was used during this treatment.  Narrative: The patient tolerated radiation treatment relatively well.   The patient did not have any unexpected difficulties with acute toxicity.  Plan: The patient has completed radiation treatment. The patient will be followed by hospice and return to our clinic on a when necessary basis. ________________________________  Jodelle Gross, M.D., Ph.D.

## 2013-03-17 ENCOUNTER — Telehealth: Payer: Self-pay

## 2013-03-17 NOTE — Telephone Encounter (Signed)
Patient past away @ Home per [facility]bit[facility] in [place] °

## 2013-04-01 ENCOUNTER — Ambulatory Visit: Payer: BC Managed Care – PPO | Admitting: Radiation Oncology

## 2013-04-04 DEATH — deceased

## 2014-10-10 IMAGING — PT NM PET TUM IMG INITIAL (PI) SKULL BASE T - THIGH
1 of 6 series · 1 of 25 positions shown · non-contrast
Comparison: CT on 06/01/2012

CLINICAL DATA: Initial treatment strategy for pancreatic mass.

NUCLEAR MEDICINE PET SKULL BASE TO THIGH
Fasting Blood Glucose:  107
TECHNIQUE: 13.6 mCi F-18 FDG was injected intravenously. CT data
was obtained and used for attenuation correction and anatomic
localization only.  (This was not acquired as a diagnostic CT
examination.) Additional exam technical data entered on
technologist worksheet.

[Series 3: ct wb fusion · axial · 4.0mm · 0.98mm/px · 1 of 547 slices shown]
[im 547/547  brain]
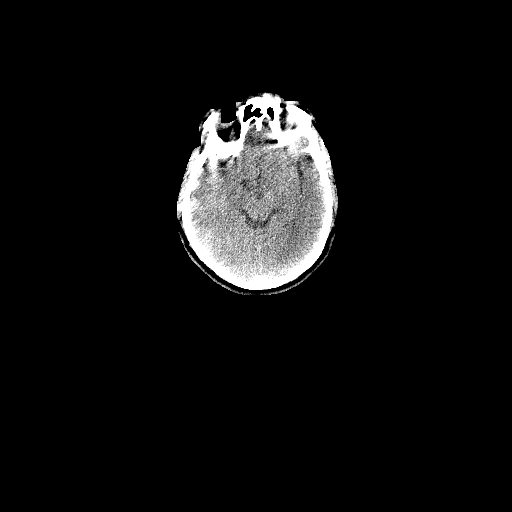

[1 of 25 positions shown; findings below may reference images not displayed]

FINDINGS: Neck: No hypermetabolic lymph nodes in the neck.

Chest:  Mild mediastinal adenopathy in the right paratracheal and
subcarinal regions shows hypermetabolic activity, with maximum SUV
measuring 11.2 in the subcarinal region and 10.2 in the right
paratracheal region.  A tiny sub-centimeter mediastinal lymph node
in the prevascular space of the anterior mediastinum also shows
hypermetabolic activity, with maximum SUV of 4.1.

No hypermetabolic hilar nodes are seen.  No suspicious pulmonary
nodules seen by CT.

Abdomen/Pelvis:  Mass in the pancreatic head and associated central
peripancreatic, mesenteric lymphadenopathy shows hypermetabolic
activity, with maximum SUV measuring 10.5. Mild
hypermetaboliclymphadenopathy also seen inferiorly in the
retroperitoneum in the retrocaval and aortocaval spaces.

No other hypermetabolic masses or lymphadenopathy seen within the
abdomen or pelvis.

Skeleton:  No focal hypermetabolic activity to suggest skeletal
metastasis.
IMPRESSION: 1.  Hypermetabolic pancreatic head mass and adjacent
peripancreatic/mesenteric lymphadenopathy, consistent with
pancreatic carcinoma and peripancreatic lymph node metastases.
2.  Metastatic abdominal retroperitoneal lymphadenopathy in the
retrocaval and aortocaval spaces.
3.  Metastatic mediastinal lymphadenopathy.
4.  No evidence of metastatic disease within the neck or pelvis.

## 2014-12-21 IMAGING — US US EXTREM LOW VENOUS*L*
1 series · 13 of 24 positions shown · non-contrast
Comparison: None.

CLINICAL DATA: Calf edema, warmth and erythema, evaluate for DVT,
history of varicose veins

LEFT LOWER EXTREMITY VENOUS DUPLEX ULTRASOUND
TECHNIQUE: Gray-scale sonography with graded compression, as well
as color Doppler and duplex ultrasound were performed to evaluate
the deep venous system of the lower extremity from the level of the
common femoral vein through the popliteal and proximal calf veins.
Spectral Doppler was utilized to evaluate flow at rest and with
distal augmentation maneuvers.

[Series 1: us extrem low venous*left* · 13 of 30 slices shown]
[im 1/30]
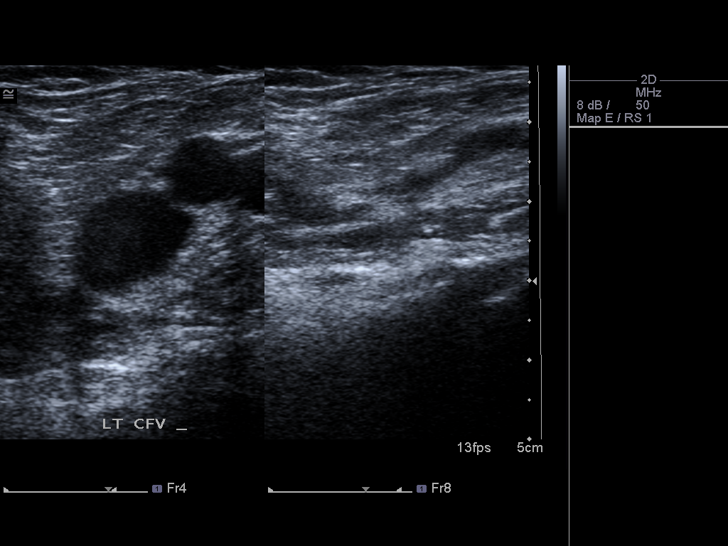
[im 3/30]
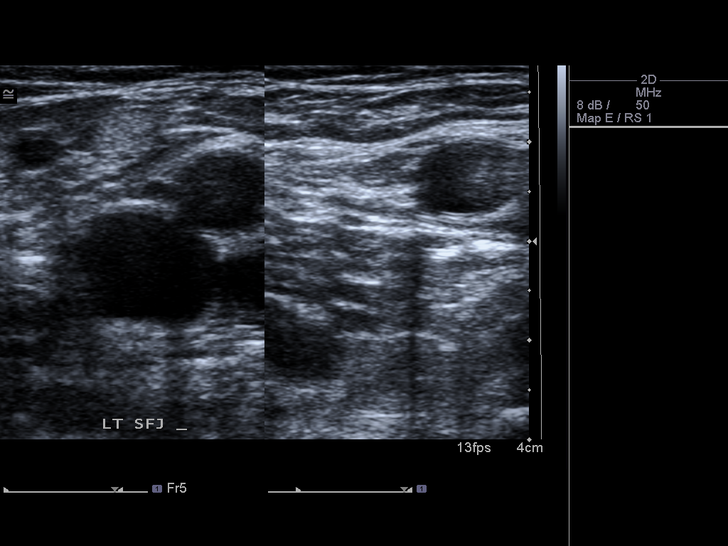
[im 6/30]
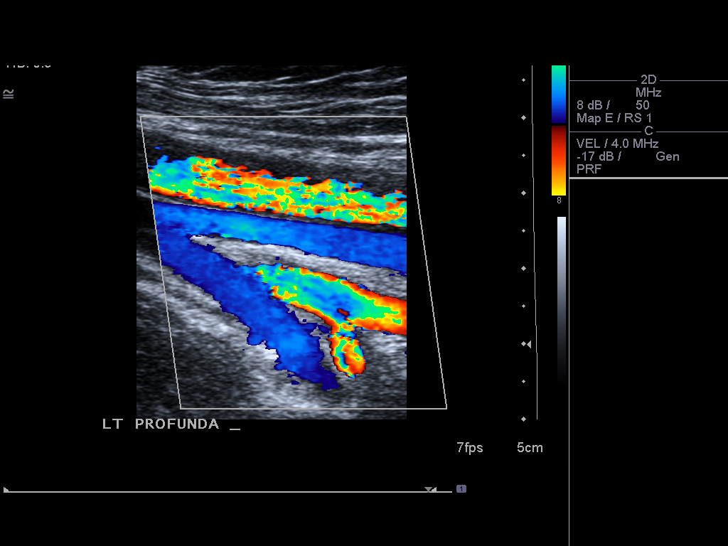
[im 8/30]
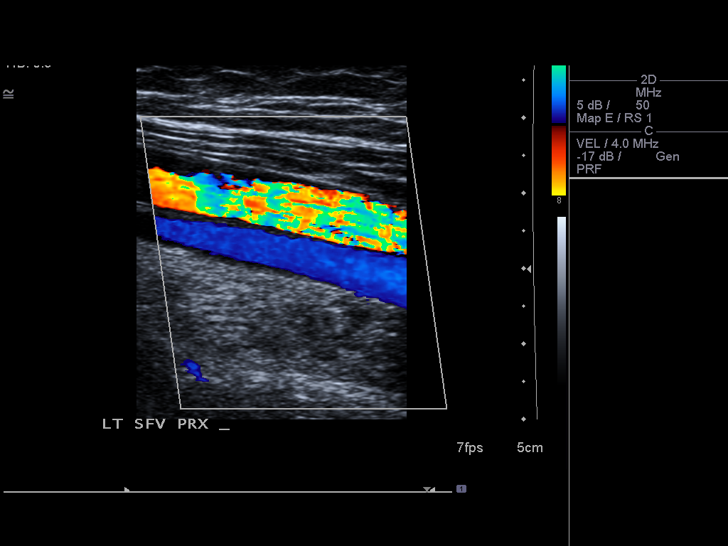
[im 11/30]
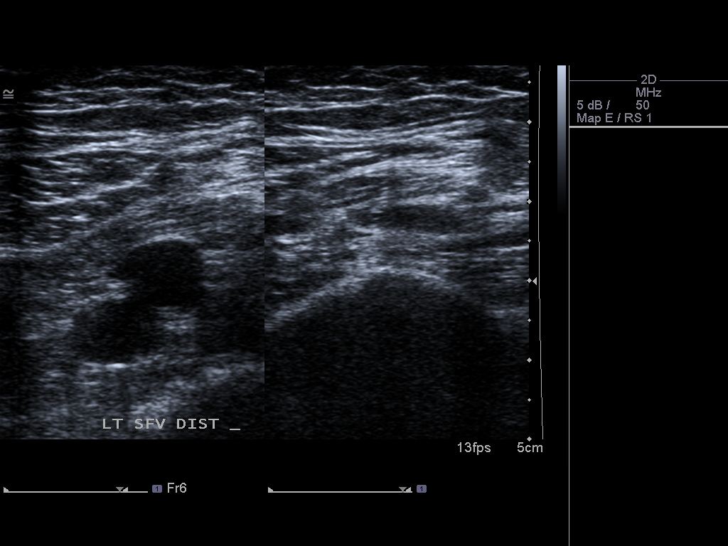
[im 13/30]
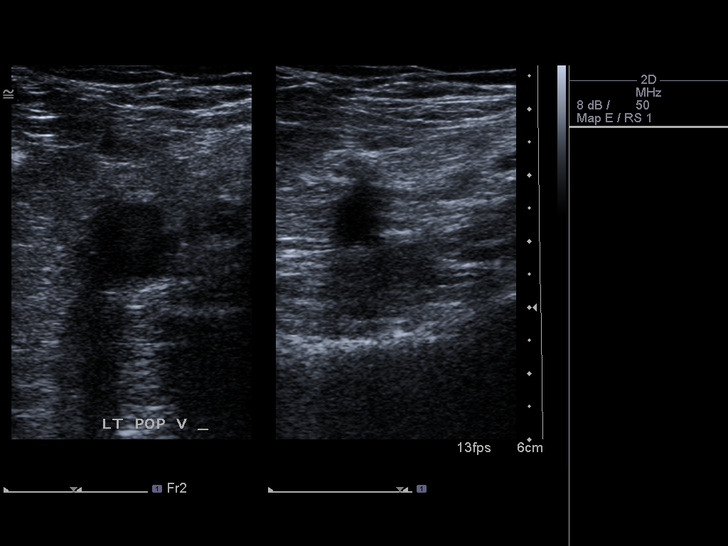
[im 16/30]
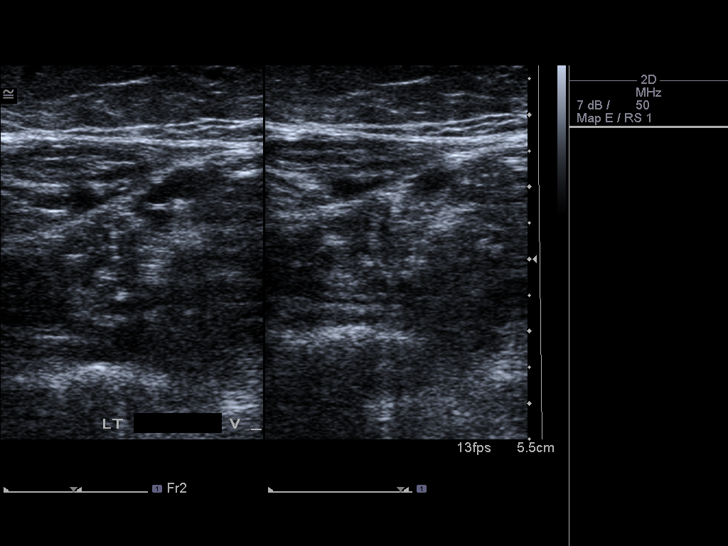
[im 17/30]
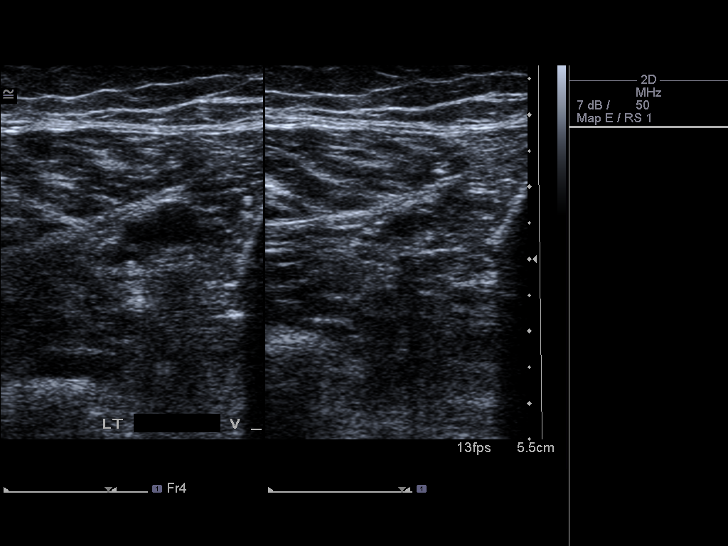
[im 19/30]
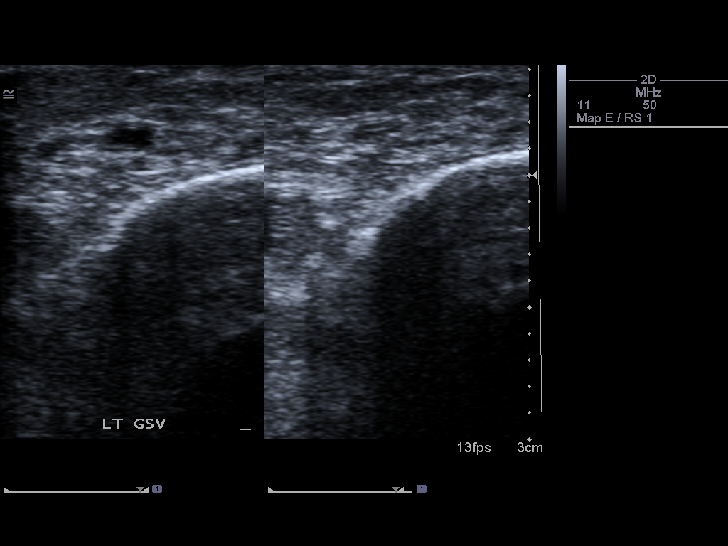
[im 22/30]
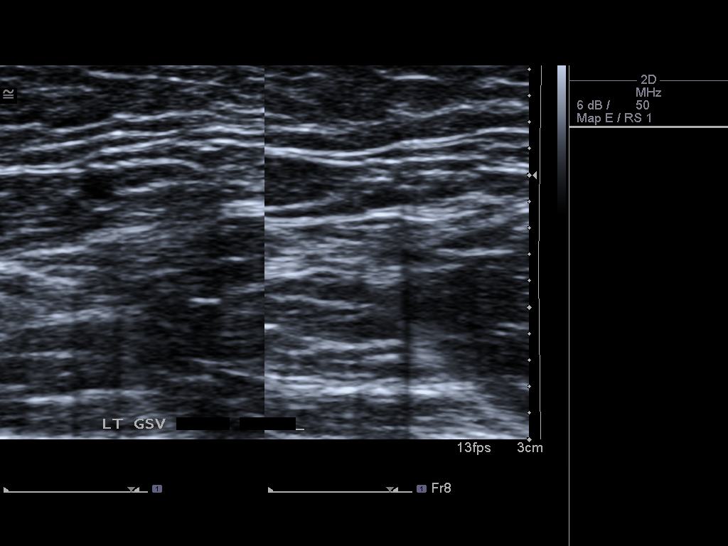
[im 24/30]
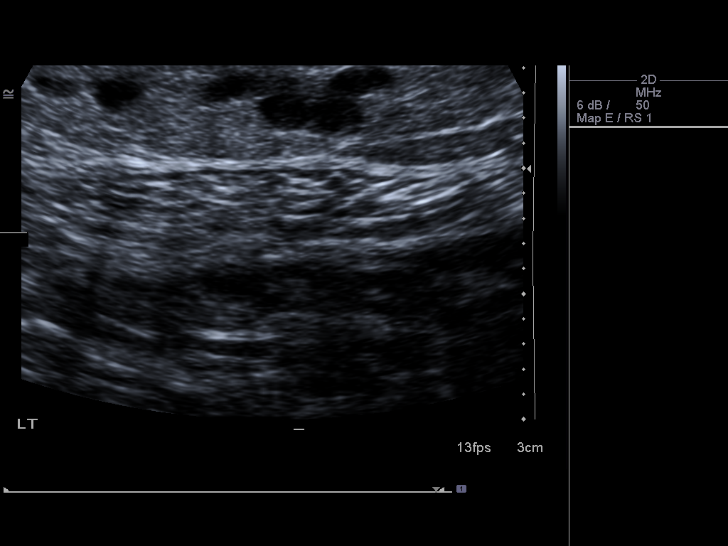
[im 27/30]
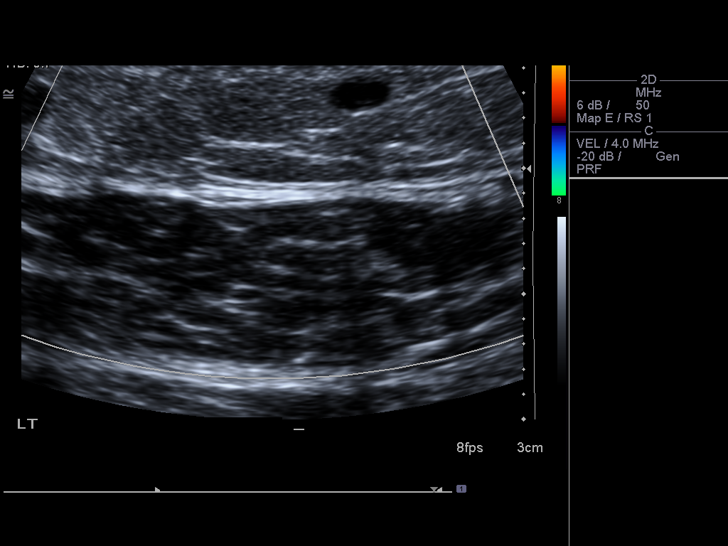
[im 30/30]
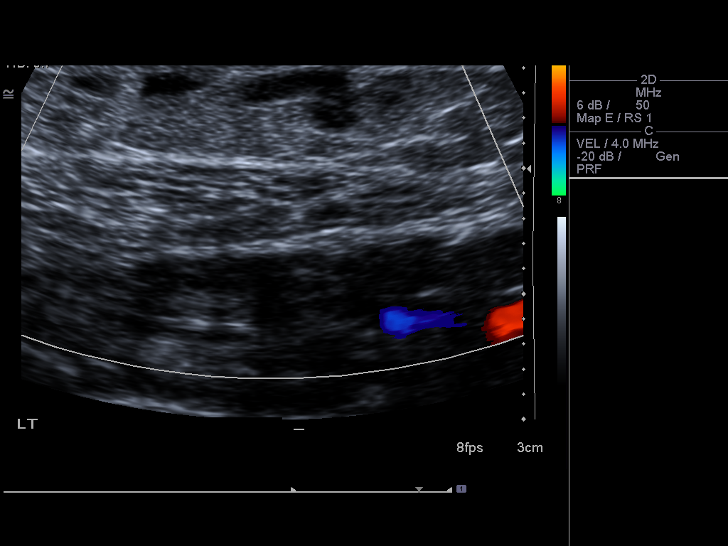

[13 of 24 positions shown; findings below may reference images not displayed]

FINDINGS: Normal compressibility of the common femoral,
superficial femoral, and popliteal veins is demonstrated, as well
as the visualized proximal calf veins.  No filling defects to
suggest DVT on grayscale or color Doppler imaging.  Doppler
waveforms show normal direction of venous flow, normal respiratory
phasicity and response to augmentation.

The greater saphenous vein is noted to be noncompressible at the
level of the upper calf (image 23).  Several noncompressible
thrombosed varicosities are noted in the posterior aspect of the
calf (images 26 through 36).
IMPRESSION: 1.  No evidence of deep venous thrombosis within the left lower
extremity.
2.  Findings compatible with superficial thrombophlebitis including
multiple thrombosed varicosities within the posterior aspect of the
calf extending to involve the greater saphenous vein at the level
of the upper calf.  Again, there is no extension into the deep
venous system.
